# Patient Record
Sex: Female | Born: 1935 | Race: Black or African American | Hispanic: No | Smoking: Current every day smoker
Health system: Southern US, Community
[De-identification: ages and names within clinical notes are randomized; demographics above are authoritative.]

## PROBLEM LIST (undated history)

## (undated) DIAGNOSIS — M21372 Foot drop, left foot: Secondary | ICD-10-CM

## (undated) DIAGNOSIS — D649 Anemia, unspecified: Secondary | ICD-10-CM

## (undated) DIAGNOSIS — M899 Disorder of bone, unspecified: Secondary | ICD-10-CM

## (undated) DIAGNOSIS — M109 Gout, unspecified: Secondary | ICD-10-CM

## (undated) DIAGNOSIS — I10 Essential (primary) hypertension: Secondary | ICD-10-CM

## (undated) DIAGNOSIS — G8929 Other chronic pain: Secondary | ICD-10-CM

## (undated) DIAGNOSIS — J309 Allergic rhinitis, unspecified: Secondary | ICD-10-CM

## (undated) DIAGNOSIS — F29 Unspecified psychosis not due to a substance or known physiological condition: Secondary | ICD-10-CM

## (undated) DIAGNOSIS — E785 Hyperlipidemia, unspecified: Secondary | ICD-10-CM

## (undated) DIAGNOSIS — M21371 Foot drop, right foot: Secondary | ICD-10-CM

## (undated) DIAGNOSIS — M545 Low back pain: Secondary | ICD-10-CM

## (undated) DIAGNOSIS — M1990 Unspecified osteoarthritis, unspecified site: Secondary | ICD-10-CM

## (undated) DIAGNOSIS — M949 Disorder of cartilage, unspecified: Secondary | ICD-10-CM

## (undated) HISTORY — DX: Hyperlipidemia, unspecified: E78.5

## (undated) HISTORY — DX: Low back pain: M54.5

## (undated) HISTORY — DX: Essential (primary) hypertension: I10

## (undated) HISTORY — DX: Disorder of bone, unspecified: M89.9

## (undated) HISTORY — DX: Other chronic pain: G89.29

## (undated) HISTORY — DX: Allergic rhinitis, unspecified: J30.9

## (undated) HISTORY — DX: Foot drop, right foot: M21.371

## (undated) HISTORY — DX: Gout, unspecified: M10.9

## (undated) HISTORY — DX: Unspecified psychosis not due to a substance or known physiological condition: F29

## (undated) HISTORY — DX: Foot drop, left foot: M21.372

## (undated) HISTORY — DX: Anemia, unspecified: D64.9

## (undated) HISTORY — DX: Disorder of cartilage, unspecified: M94.9

---

## 1965-07-05 HISTORY — PX: OTHER SURGICAL HISTORY: SHX169

## 1998-06-03 ENCOUNTER — Encounter: Payer: Self-pay | Admitting: Family Medicine

## 1998-06-03 ENCOUNTER — Ambulatory Visit (HOSPITAL_COMMUNITY): Admission: RE | Admit: 1998-06-03 | Discharge: 1998-06-03 | Payer: Self-pay | Admitting: Internal Medicine

## 2001-09-05 ENCOUNTER — Encounter: Payer: Self-pay | Admitting: Emergency Medicine

## 2001-09-05 ENCOUNTER — Emergency Department (HOSPITAL_COMMUNITY): Admission: EM | Admit: 2001-09-05 | Discharge: 2001-09-05 | Payer: Self-pay | Admitting: Emergency Medicine

## 2001-10-05 ENCOUNTER — Encounter: Admission: RE | Admit: 2001-10-05 | Discharge: 2001-10-05 | Payer: Self-pay | Admitting: Internal Medicine

## 2001-10-16 ENCOUNTER — Encounter: Admission: RE | Admit: 2001-10-16 | Discharge: 2001-10-16 | Payer: Self-pay | Admitting: Internal Medicine

## 2001-10-17 ENCOUNTER — Encounter: Admission: RE | Admit: 2001-10-17 | Discharge: 2001-10-17 | Payer: Self-pay

## 2001-10-24 ENCOUNTER — Encounter: Admission: RE | Admit: 2001-10-24 | Discharge: 2001-10-24 | Payer: Self-pay | Admitting: Internal Medicine

## 2003-01-09 ENCOUNTER — Encounter: Admission: RE | Admit: 2003-01-09 | Discharge: 2003-01-09 | Payer: Self-pay | Admitting: Internal Medicine

## 2003-01-18 ENCOUNTER — Encounter: Payer: Self-pay | Admitting: Internal Medicine

## 2003-01-18 ENCOUNTER — Ambulatory Visit (HOSPITAL_COMMUNITY): Admission: RE | Admit: 2003-01-18 | Discharge: 2003-01-18 | Payer: Self-pay | Admitting: Internal Medicine

## 2003-04-04 ENCOUNTER — Encounter: Admission: RE | Admit: 2003-04-04 | Discharge: 2003-04-04 | Payer: Self-pay | Admitting: Internal Medicine

## 2003-08-07 ENCOUNTER — Encounter: Admission: RE | Admit: 2003-08-07 | Discharge: 2003-08-07 | Payer: Self-pay | Admitting: Internal Medicine

## 2003-08-23 ENCOUNTER — Encounter: Admission: RE | Admit: 2003-08-23 | Discharge: 2003-08-23 | Payer: Self-pay | Admitting: Internal Medicine

## 2003-10-20 ENCOUNTER — Emergency Department (HOSPITAL_COMMUNITY): Admission: EM | Admit: 2003-10-20 | Discharge: 2003-10-20 | Payer: Self-pay | Admitting: Emergency Medicine

## 2003-11-18 ENCOUNTER — Encounter: Admission: RE | Admit: 2003-11-18 | Discharge: 2003-11-18 | Payer: Self-pay | Admitting: Internal Medicine

## 2003-12-26 ENCOUNTER — Emergency Department (HOSPITAL_COMMUNITY): Admission: EM | Admit: 2003-12-26 | Discharge: 2003-12-26 | Payer: Self-pay | Admitting: Emergency Medicine

## 2004-09-08 ENCOUNTER — Ambulatory Visit: Payer: Self-pay | Admitting: Internal Medicine

## 2004-09-18 ENCOUNTER — Ambulatory Visit: Payer: Self-pay | Admitting: Internal Medicine

## 2004-09-21 ENCOUNTER — Ambulatory Visit: Payer: Self-pay | Admitting: Internal Medicine

## 2004-09-23 ENCOUNTER — Ambulatory Visit: Payer: Self-pay

## 2004-10-12 ENCOUNTER — Ambulatory Visit: Payer: Self-pay | Admitting: Gastroenterology

## 2004-10-26 ENCOUNTER — Ambulatory Visit: Payer: Self-pay | Admitting: Gastroenterology

## 2004-10-26 ENCOUNTER — Ambulatory Visit (HOSPITAL_COMMUNITY): Admission: RE | Admit: 2004-10-26 | Discharge: 2004-10-26 | Payer: Self-pay | Admitting: Gastroenterology

## 2004-11-11 ENCOUNTER — Ambulatory Visit: Payer: Self-pay | Admitting: Internal Medicine

## 2004-11-23 ENCOUNTER — Ambulatory Visit: Payer: Self-pay | Admitting: Internal Medicine

## 2005-02-17 ENCOUNTER — Ambulatory Visit: Payer: Self-pay | Admitting: Internal Medicine

## 2005-08-31 ENCOUNTER — Ambulatory Visit: Payer: Self-pay | Admitting: Internal Medicine

## 2005-10-07 ENCOUNTER — Ambulatory Visit: Payer: Self-pay | Admitting: Internal Medicine

## 2006-05-10 ENCOUNTER — Ambulatory Visit: Payer: Self-pay | Admitting: Internal Medicine

## 2006-09-28 ENCOUNTER — Ambulatory Visit: Payer: Self-pay | Admitting: Internal Medicine

## 2006-09-28 LAB — CONVERTED CEMR LAB
ALT: 20 units/L (ref 0–40)
AST: 23 units/L (ref 0–37)
Albumin: 3.6 g/dL (ref 3.5–5.2)
Alkaline Phosphatase: 97 units/L (ref 39–117)
BUN: 14 mg/dL (ref 6–23)
Basophils Absolute: 0 10*3/uL (ref 0.0–0.1)
Basophils Relative: 0.1 % (ref 0.0–1.0)
Bilirubin Urine: NEGATIVE
Bilirubin, Direct: 0.1 mg/dL (ref 0.0–0.3)
CO2: 31 meq/L (ref 19–32)
Calcium: 9.7 mg/dL (ref 8.4–10.5)
Chloride: 102 meq/L (ref 96–112)
Cholesterol: 248 mg/dL (ref 0–200)
Creatinine, Ser: 1 mg/dL (ref 0.4–1.2)
Crystals: NEGATIVE
Direct LDL: 183.6 mg/dL
Eosinophils Absolute: 0.1 10*3/uL (ref 0.0–0.6)
Eosinophils Relative: 1.8 % (ref 0.0–5.0)
GFR calc Af Amer: 69 mL/min
GFR calc non Af Amer: 57 mL/min
Glucose, Bld: 100 mg/dL — ABNORMAL HIGH (ref 70–99)
HCT: 36.3 % (ref 36.0–46.0)
HDL: 48.1 mg/dL (ref >39.0)
Hemoglobin: 12.4 g/dL (ref 12.0–15.0)
Ketones, ur: NEGATIVE mg/dL
Lymphocytes Relative: 26.4 % (ref 12.0–46.0)
MCHC: 34.1 g/dL (ref 30.0–36.0)
MCV: 90.5 fL (ref 78.0–100.0)
Monocytes Absolute: 0.6 10*3/uL (ref 0.2–0.7)
Monocytes Relative: 7.9 % (ref 3.0–11.0)
Mucus, UA: NEGATIVE
Neutro Abs: 4.6 10*3/uL (ref 1.4–7.7)
Neutrophils Relative %: 63.8 % (ref 43.0–77.0)
Nitrite: NEGATIVE
Platelets: 313 10*3/uL (ref 150–400)
Potassium: 4.2 meq/L (ref 3.5–5.1)
RBC: 4.01 M/uL (ref 3.87–5.11)
RDW: 14 % (ref 11.5–14.6)
Sodium: 142 meq/L (ref 135–145)
Specific Gravity, Urine: 1.02 (ref 1.000–1.03)
TSH: 2.65 microintl units/mL (ref 0.35–5.50)
Total Bilirubin: 0.7 mg/dL (ref 0.3–1.2)
Total CHOL/HDL Ratio: 5.2
Total Protein, Urine: NEGATIVE mg/dL
Total Protein: 7.4 g/dL (ref 6.0–8.3)
Triglycerides: 157 mg/dL — ABNORMAL HIGH (ref 0–149)
Urine Glucose: NEGATIVE mg/dL
Urobilinogen, UA: 0.2 (ref 0.0–1.0)
VLDL: 31 mg/dL (ref 0–40)
WBC: 7.2 10*3/uL (ref 4.5–10.5)
pH: 6 (ref 5.0–8.0)

## 2006-11-04 ENCOUNTER — Ambulatory Visit: Payer: Self-pay | Admitting: Internal Medicine

## 2006-11-04 LAB — CONVERTED CEMR LAB
Bacteria, UA: NEGATIVE
Bilirubin Urine: NEGATIVE
Crystals: NEGATIVE
Ketones, ur: NEGATIVE mg/dL
Nitrite: NEGATIVE
Specific Gravity, Urine: 1.01 (ref 1.000–1.03)
Total Protein, Urine: NEGATIVE mg/dL
Urine Glucose: NEGATIVE mg/dL
Urobilinogen, UA: 0.2 (ref 0.0–1.0)
pH: 6 (ref 5.0–8.0)

## 2006-12-22 ENCOUNTER — Ambulatory Visit: Payer: Self-pay | Admitting: Internal Medicine

## 2007-02-22 ENCOUNTER — Encounter: Payer: Self-pay | Admitting: Internal Medicine

## 2007-02-22 DIAGNOSIS — E785 Hyperlipidemia, unspecified: Secondary | ICD-10-CM | POA: Insufficient documentation

## 2007-02-22 DIAGNOSIS — I10 Essential (primary) hypertension: Secondary | ICD-10-CM

## 2007-02-22 HISTORY — DX: Hyperlipidemia, unspecified: E78.5

## 2007-02-22 HISTORY — DX: Essential (primary) hypertension: I10

## 2007-04-09 ENCOUNTER — Emergency Department (HOSPITAL_COMMUNITY): Admission: EM | Admit: 2007-04-09 | Discharge: 2007-04-09 | Payer: Self-pay | Admitting: Emergency Medicine

## 2007-05-05 ENCOUNTER — Other Ambulatory Visit: Admission: RE | Admit: 2007-05-05 | Discharge: 2007-05-05 | Payer: Self-pay | Admitting: Otolaryngology

## 2007-07-28 ENCOUNTER — Encounter: Payer: Self-pay | Admitting: Internal Medicine

## 2007-11-10 ENCOUNTER — Encounter: Payer: Self-pay | Admitting: Internal Medicine

## 2008-02-16 ENCOUNTER — Telehealth: Payer: Self-pay | Admitting: Internal Medicine

## 2008-03-26 ENCOUNTER — Ambulatory Visit: Payer: Self-pay | Admitting: Internal Medicine

## 2008-03-26 DIAGNOSIS — R350 Frequency of micturition: Secondary | ICD-10-CM | POA: Insufficient documentation

## 2008-03-27 ENCOUNTER — Encounter: Payer: Self-pay | Admitting: Internal Medicine

## 2008-03-27 DIAGNOSIS — M899 Disorder of bone, unspecified: Secondary | ICD-10-CM | POA: Insufficient documentation

## 2008-03-27 DIAGNOSIS — J309 Allergic rhinitis, unspecified: Secondary | ICD-10-CM | POA: Insufficient documentation

## 2008-03-27 DIAGNOSIS — M949 Disorder of cartilage, unspecified: Secondary | ICD-10-CM

## 2008-03-27 DIAGNOSIS — K5730 Diverticulosis of large intestine without perforation or abscess without bleeding: Secondary | ICD-10-CM | POA: Insufficient documentation

## 2008-03-27 HISTORY — DX: Disorder of bone, unspecified: M89.9

## 2008-03-27 HISTORY — DX: Allergic rhinitis, unspecified: J30.9

## 2008-03-27 LAB — CONVERTED CEMR LAB
ALT: 17 units/L (ref 0–35)
AST: 22 units/L (ref 0–37)
Albumin: 3.7 g/dL (ref 3.5–5.2)
Alkaline Phosphatase: 84 units/L (ref 39–117)
BUN: 20 mg/dL (ref 6–23)
Basophils Absolute: 0.1 10*3/uL (ref 0.0–0.1)
Basophils Relative: 1 % (ref 0.0–3.0)
Bilirubin Urine: NEGATIVE
Bilirubin, Direct: 0.2 mg/dL (ref 0.0–0.3)
CO2: 30 meq/L (ref 19–32)
Calcium: 9.6 mg/dL (ref 8.4–10.5)
Chloride: 105 meq/L (ref 96–112)
Cholesterol: 264 mg/dL (ref 0–200)
Creatinine, Ser: 1.1 mg/dL (ref 0.4–1.2)
Crystals: NEGATIVE
Direct LDL: 191 mg/dL
Eosinophils Absolute: 0.1 10*3/uL (ref 0.0–0.7)
Eosinophils Relative: 1.5 % (ref 0.0–5.0)
GFR calc Af Amer: 62 mL/min
GFR calc non Af Amer: 51 mL/min
Glucose, Bld: 93 mg/dL (ref 70–99)
HCT: 35.9 % — ABNORMAL LOW (ref 36.0–46.0)
HDL: 49.1 mg/dL (ref >39.0)
Hemoglobin: 12.2 g/dL (ref 12.0–15.0)
Ketones, ur: NEGATIVE mg/dL
Lymphocytes Relative: 24.9 % (ref 12.0–46.0)
MCHC: 33.9 g/dL (ref 30.0–36.0)
MCV: 92.9 fL (ref 78.0–100.0)
Monocytes Absolute: 0.5 10*3/uL (ref 0.1–1.0)
Monocytes Relative: 7.9 % (ref 3.0–12.0)
Mucus, UA: NEGATIVE
Neutro Abs: 4.3 10*3/uL (ref 1.4–7.7)
Neutrophils Relative %: 64.7 % (ref 43.0–77.0)
Nitrite: POSITIVE — AB
Platelets: 293 10*3/uL (ref 150–400)
Potassium: 4.1 meq/L (ref 3.5–5.1)
RBC: 3.86 M/uL — ABNORMAL LOW (ref 3.87–5.11)
RDW: 14 % (ref 11.5–14.6)
Sodium: 143 meq/L (ref 135–145)
Specific Gravity, Urine: 1.025 (ref 1.000–1.03)
TSH: 1.85 microintl units/mL (ref 0.35–5.50)
Total Bilirubin: 0.6 mg/dL (ref 0.3–1.2)
Total CHOL/HDL Ratio: 5.4
Total Protein, Urine: 30 mg/dL — AB
Total Protein: 7.5 g/dL (ref 6.0–8.3)
Triglycerides: 161 mg/dL — ABNORMAL HIGH (ref 0–149)
Urine Glucose: NEGATIVE mg/dL
Urobilinogen, UA: 0.2 (ref 0.0–1.0)
VLDL: 32 mg/dL (ref 0–40)
WBC: 6.7 10*3/uL (ref 4.5–10.5)
pH: 5.5 (ref 5.0–8.0)

## 2008-03-28 LAB — CONVERTED CEMR LAB: Vit D, 1,25-Dihydroxy: 20 — ABNORMAL LOW (ref 30–89)

## 2009-04-21 ENCOUNTER — Telehealth: Payer: Self-pay | Admitting: Internal Medicine

## 2009-05-20 ENCOUNTER — Inpatient Hospital Stay (HOSPITAL_COMMUNITY): Admission: EM | Admit: 2009-05-20 | Discharge: 2009-05-23 | Payer: Self-pay | Admitting: Emergency Medicine

## 2009-05-20 ENCOUNTER — Ambulatory Visit: Payer: Self-pay | Admitting: Internal Medicine

## 2009-05-21 ENCOUNTER — Encounter: Payer: Self-pay | Admitting: Internal Medicine

## 2009-05-22 ENCOUNTER — Encounter (INDEPENDENT_AMBULATORY_CARE_PROVIDER_SITE_OTHER): Payer: Self-pay | Admitting: Internal Medicine

## 2009-05-26 ENCOUNTER — Telehealth: Payer: Self-pay | Admitting: Internal Medicine

## 2009-06-03 ENCOUNTER — Emergency Department (HOSPITAL_COMMUNITY): Admission: EM | Admit: 2009-06-03 | Discharge: 2009-06-03 | Payer: Self-pay | Admitting: Emergency Medicine

## 2009-06-05 ENCOUNTER — Encounter: Payer: Self-pay | Admitting: Internal Medicine

## 2009-06-06 ENCOUNTER — Ambulatory Visit: Payer: Self-pay | Admitting: Internal Medicine

## 2009-06-25 ENCOUNTER — Encounter: Payer: Self-pay | Admitting: Internal Medicine

## 2009-06-25 ENCOUNTER — Ambulatory Visit: Payer: Self-pay | Admitting: Internal Medicine

## 2009-06-25 ENCOUNTER — Telehealth: Payer: Self-pay | Admitting: Internal Medicine

## 2009-06-25 DIAGNOSIS — M109 Gout, unspecified: Secondary | ICD-10-CM | POA: Insufficient documentation

## 2009-06-25 DIAGNOSIS — M25579 Pain in unspecified ankle and joints of unspecified foot: Secondary | ICD-10-CM | POA: Insufficient documentation

## 2009-06-25 DIAGNOSIS — D649 Anemia, unspecified: Secondary | ICD-10-CM

## 2009-06-25 HISTORY — DX: Anemia, unspecified: D64.9

## 2009-06-26 ENCOUNTER — Telehealth: Payer: Self-pay | Admitting: Internal Medicine

## 2009-06-26 ENCOUNTER — Encounter: Payer: Self-pay | Admitting: Internal Medicine

## 2009-06-30 ENCOUNTER — Encounter: Payer: Self-pay | Admitting: Internal Medicine

## 2009-07-03 ENCOUNTER — Encounter: Payer: Self-pay | Admitting: Internal Medicine

## 2009-07-07 ENCOUNTER — Telehealth: Payer: Self-pay | Admitting: Internal Medicine

## 2009-07-10 ENCOUNTER — Telehealth: Payer: Self-pay | Admitting: Internal Medicine

## 2009-07-13 ENCOUNTER — Emergency Department (HOSPITAL_COMMUNITY): Admission: EM | Admit: 2009-07-13 | Discharge: 2009-07-13 | Payer: Self-pay | Admitting: Emergency Medicine

## 2009-07-21 ENCOUNTER — Telehealth: Payer: Self-pay | Admitting: Internal Medicine

## 2009-07-24 ENCOUNTER — Encounter: Payer: Self-pay | Admitting: Internal Medicine

## 2009-07-25 ENCOUNTER — Telehealth (INDEPENDENT_AMBULATORY_CARE_PROVIDER_SITE_OTHER): Payer: Self-pay | Admitting: *Deleted

## 2009-07-29 ENCOUNTER — Ambulatory Visit: Payer: Self-pay | Admitting: Internal Medicine

## 2009-09-17 ENCOUNTER — Encounter: Admission: RE | Admit: 2009-09-17 | Discharge: 2009-09-17 | Payer: Self-pay | Admitting: Orthopaedic Surgery

## 2009-09-23 ENCOUNTER — Ambulatory Visit: Payer: Self-pay | Admitting: Internal Medicine

## 2009-09-23 DIAGNOSIS — M109 Gout, unspecified: Secondary | ICD-10-CM | POA: Insufficient documentation

## 2009-09-23 HISTORY — DX: Gout, unspecified: M10.9

## 2009-09-24 LAB — CONVERTED CEMR LAB
ALT: 15 units/L (ref 0–35)
AST: 17 units/L (ref 0–37)
Albumin: 3.6 g/dL (ref 3.5–5.2)
Alkaline Phosphatase: 70 units/L (ref 39–117)
BUN: 12 mg/dL (ref 6–23)
Basophils Absolute: 0.1 10*3/uL (ref 0.0–0.1)
Basophils Relative: 0.9 % (ref 0.0–3.0)
Bilirubin Urine: NEGATIVE
Bilirubin, Direct: 0.1 mg/dL (ref 0.0–0.3)
CO2: 31 meq/L (ref 19–32)
Calcium: 9.4 mg/dL (ref 8.4–10.5)
Chloride: 106 meq/L (ref 96–112)
Cholesterol: 163 mg/dL (ref 0–200)
Creatinine, Ser: 0.9 mg/dL (ref 0.4–1.2)
Eosinophils Absolute: 0.1 10*3/uL (ref 0.0–0.7)
Eosinophils Relative: 1.1 % (ref 0.0–5.0)
Folate: 9.3 ng/mL
GFR calc non Af Amer: 77.43 mL/min (ref >60)
Glucose, Bld: 78 mg/dL (ref 70–99)
HCT: 34.6 % — ABNORMAL LOW (ref 36.0–46.0)
HDL: 56.5 mg/dL (ref >39.00)
Hemoglobin: 11.5 g/dL — ABNORMAL LOW (ref 12.0–15.0)
Iron: 42 ug/dL (ref 42–145)
LDL Cholesterol: 80 mg/dL (ref 0–99)
Lymphocytes Relative: 29 % (ref 12.0–46.0)
Lymphs Abs: 1.8 10*3/uL (ref 0.7–4.0)
MCHC: 33.2 g/dL (ref 30.0–36.0)
MCV: 94 fL (ref 78.0–100.0)
Monocytes Absolute: 0.5 10*3/uL (ref 0.1–1.0)
Monocytes Relative: 9 % (ref 3.0–12.0)
Neutro Abs: 3.6 10*3/uL (ref 1.4–7.7)
Neutrophils Relative %: 60 % (ref 43.0–77.0)
Nitrite: NEGATIVE
Platelets: 258 10*3/uL (ref 150.0–400.0)
Potassium: 3.9 meq/L (ref 3.5–5.1)
RBC: 3.68 M/uL — ABNORMAL LOW (ref 3.87–5.11)
RDW: 16.5 % — ABNORMAL HIGH (ref 11.5–14.6)
Saturation Ratios: 22 % (ref 20.0–50.0)
Sodium: 142 meq/L (ref 135–145)
Specific Gravity, Urine: 1.02 (ref 1.000–1.030)
TSH: 3.61 microintl units/mL (ref 0.35–5.50)
Total Bilirubin: 0.4 mg/dL (ref 0.3–1.2)
Total CHOL/HDL Ratio: 3
Total Protein, Urine: NEGATIVE mg/dL
Total Protein: 6.6 g/dL (ref 6.0–8.3)
Transferrin: 136.6 mg/dL — ABNORMAL LOW (ref 212.0–360.0)
Triglycerides: 132 mg/dL (ref 0.0–149.0)
Uric Acid, Serum: 6.9 mg/dL (ref 2.4–7.0)
Urine Glucose: NEGATIVE mg/dL
Urobilinogen, UA: 0.2 (ref 0.0–1.0)
VLDL: 26.4 mg/dL (ref 0.0–40.0)
Vit D, 25-Hydroxy: 53 ng/mL (ref 30–89)
Vitamin B-12: 222 pg/mL (ref 211–911)
WBC: 6.1 10*3/uL (ref 4.5–10.5)
pH: 5.5 (ref 5.0–8.0)

## 2009-10-20 ENCOUNTER — Encounter: Payer: Self-pay | Admitting: Internal Medicine

## 2009-11-26 ENCOUNTER — Encounter: Payer: Self-pay | Admitting: Internal Medicine

## 2010-04-21 ENCOUNTER — Encounter: Payer: Self-pay | Admitting: Internal Medicine

## 2010-08-04 NOTE — Letter (Signed)
Summary: CMN for [facility]CS  CMN for [facility]   Imported By: Sherian Rein 06/10/2009 09:13:43  _____________________________________________________________________  External Attachment:    Type:   Image     Comment:   External Document

## 2010-08-04 NOTE — Letter (Signed)
Summary: CMN/HCS  CMN/HCS   Imported By: Lester Higganum 12/02/2009 08:47:25  _____________________________________________________________________  External Attachment:    Type:   Image     Comment:   External Document

## 2010-08-04 NOTE — Procedures (Signed)
Summary: Upper Endoscopy  Patient: Sydney Lowery Note: All result statuses are Final unless otherwise noted.  Tests: (1) Upper Endoscopy (EGD)   EGD Upper Endoscopy       DONE     [facility]     [address] [place], [place]  [address]           ENDOSCOPY PROCEDURE REPORT           PATIENT:  Sydney, Lowery  MR#:  [id number]     BIRTHDATE:  06/27/1927, 81 yrs. old  GENDER:  female           ENDOSCOPIST:  Iva Boop, MD, [facility]     Referred by:           Hospitalist           PROCEDURE DATE:  05/21/2009     PROCEDURE:  EGD with biopsy for H. pylori     ASA CLASS:  Class III     INDICATIONS:  hemorrhage of GI tract           MEDICATIONS:   There was residual sedation effect present from     prior procedure., Versed 1 mg IV     TOPICAL ANESTHETIC:  Cetacaine Spray           DESCRIPTION OF PROCEDURE:   After the risks benefits and     alternatives of the procedure were thoroughly explained, informed     consent was obtained.  The [id number] ([id number]) endoscope was     introduced through the mouth and advanced to the second portion of     the duodenum, without limitations.  The instrument was slowly     withdrawn as the mucosa was fully examined.     <<PROCEDUREIMAGES>>           Multiple erosions were found in the antrum. All clean-based and     not greater than 3 mm. A biopsy for H. pylori was taken.  A hiatal     hernia was found. Sliding hiatal hernia 3-4 cm.  Otherwise the     examination was normal.    Retroflexed views revealed a hiatal     hernia.    The scope was then withdrawn from the patient and the     procedure completed.           COMPLICATIONS:  None           ENDOSCOPIC IMPRESSION:     1) Erosions, multiple in the antrum, likely from Goody's     2) Hiatal hernia     3) Otherwise normal examination           RECOMMENDATIONS:     Stop Goody's and find other treatment for arthritis if possible.           Treat H. pylori if +     Daily PPI  reasonable given likelihood she will use NSAID's           Colonoscopy was incomplete and this exam does not reveal source     of GI hemorrhage so will obtain ACBE to complete evaluation.           Iva Boop, MD, [facility]           n.     eSIGNED:   Iva Boop at 05/21/2009 12:18 PM           Consuella Lose, [id number]  Note: An exclamation  mark (!) indicates a result that was not dispersed into the flowsheet. Document Creation Date: 05/21/2009 4:06 PM _______________________________________________________________________  (1) Order result status: Final Collection or observation date-time: 05/21/2009 12:11 Requested date-time:  Receipt date-time:  Reported date-time:  Referring Physician:   Ordering Physician: Stan Head [phone]) Specimen Source:  Source: Launa Grill Order Number: [id number] Lab site:   Appended Document: Upper Endoscopy L-Clotest (H. pylori), Biopsy - STATUS: Final                                            Perform Date: 17Nov10 12:00  Ordered By: Lorelle Gibbs Date: 17Nov10 12:25                                       Last Updated Date: 98JXB14 12:17  Facility: [facility]                              Department: MICR  Accession #: [id number]                  USN:       [id number]  Findings  Result Name                              Result     Abnl   Normal Range     Units      Perf. Loc.  Helicobacter Screen                      SEE NOTE.         URENEG    UREASE NEGATIVE    CLOTEST DETECTS THE UREASE    ENZYME OF HELICOBACTER    PYLORI IN GASTRIC MUCOSAL    BIOPSIES.  Additional Information  HL7 RESULT STATUS : F  External IF Update Timestamp : 2009-05-22:12:14:00.000000

## 2010-08-04 NOTE — Consult Note (Signed)
Summary: [facility]   Imported By: Lester Ranger 08/06/2009 08:48:35  _____________________________________________________________________  External Attachment:    Type:   Image     Comment:   External Document

## 2010-08-04 NOTE — Assessment & Plan Note (Signed)
Summary: post hosp f/u from [facility]/#/cd   Vital Signs:  Patient profile:   77 year old female Height:      65 inches Weight:      187 pounds BMI:     31.23 O2 Sat:      98 % on Room air Temp:     98.7 degrees F oral Pulse rate:   71 / minute BP sitting:   150 / 102  (left arm) Cuff size:   large  Vitals Entered ByZella Ball Ewing (June 25, 2009 3:13 PM)  O2 Flow:  Room air CC: hospital followup/RE   CC:  hospital followup/RE.  History of Present Illness: here with pain, swelling to left ankle and foot quite severe, seemed to occur since she was hospd (only occurred post d/c);  seen and treated for ? gout at the ER recent and seemed some improved with the prednisone as far as the intense pain and heat, but unfortunately significant pain and marked swelling persist;  she has no obious inciting event of trauma she recalls and no hx of charcot joint in the past;  denies fever, new injury or trauma,  needs med refills as she has been out of BP for 3 days;  did not seem better with the detrol so has not been taking for over 6 mo and does not want to consider other tx at this time although she still has urinary freq and mild wetting accidents /wears depends;  duaghter with her today remarks she essentially has severe problem with walking, has to be essentially carried and is using the office wheelchair while she is here;  has seen murphy wainer in the past  for other ortho problems  Problems Prior to Update: 1)  Pain in Joint, Ankle and Foot  (ICD-719.47) 2)  Anemia-nos  (ICD-285.9) 3)  Acute Gouty Arthropathy  (ICD-274.01) 4)  Allergic Rhinitis  (ICD-477.9) 5)  Diverticulosis, Colon  (ICD-562.10) 6)  Osteopenia  (ICD-733.90) 7)  Frequency, Urinary  (ICD-788.41) 8)  Preventive Health Care  (ICD-V70.0) 9)  Hypertension  (ICD-401.9) 10)  Hyperlipidemia  (ICD-272.4)  Medications Prior to Update: 1)  Lisinopril 40 Mg Tabs (Lisinopril) .... Take 1 Tablet By Mouth Once A Day 2)   Triamterene-Hctz 37.5-25 Mg Caps (Triamterene-Hctz) .... Take 1 Capsule By Mouth Once A Day 3)  Amlodipine Besylate 5 Mg Tabs (Amlodipine Besylate) .Marland Kitchen.. 1 By Mouth Once Daily 4)  Simvastatin 40 Mg Tabs (Simvastatin) .Marland Kitchen.. 1po Once Daily 5)  Detrol La 4 Mg Xr24h-Cap (Tolterodine Tartrate) .Marland Kitchen.. 1 By Mouth Once Daily 6)  Adult Aspirin Ec Low Strength 81 Mg Tbec (Aspirin) .Marland Kitchen.. 1 By Mouth Once Daily 7)  Ciprofloxacin Hcl 500 Mg Tabs (Ciprofloxacin Hcl) .Marland Kitchen.. 1 By Mouth Two Times A Day  Current Medications (verified): 1)  Lisinopril 40 Mg Tabs (Lisinopril) .... Take 1 Tablet By Mouth Once A Day 2)  Triamterene-Hctz 37.5-25 Mg Caps (Triamterene-Hctz) .... Take 1 Capsule By Mouth Once A Day 3)  Amlodipine Besylate 5 Mg Tabs (Amlodipine Besylate) .Marland Kitchen.. 1 By Mouth Once Daily 4)  Simvastatin 40 Mg Tabs (Simvastatin) .Marland Kitchen.. 1po Once Daily 5)  Alprazolam 0.25 Mg Tabs (Alprazolam) .Marland Kitchen.. 1 By Mouth Three Times A Day As Needed 6)  Oxycodone Hcl 5 Mg Tabs (Oxycodone Hcl) .Marland Kitchen.. 1 By Mouth Q 6 Hrs As Needed 7)  Prednisone 10 Mg Tabs (Prednisone) .... 4po Qd For 3days, Then 3po Qd For 3days, Then 2po Qd For 3days, Then 1po Qd For 3 Days, Then  Stop  Allergies (verified): 1)  ! Penicillin 2)  ! 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   Head:  normocephalic and atraumatic.   Eyes:  vision grossly intact, pupils equal, and pupils round.   Ears:  R  ear normal and L ear normal.   Nose:  no external deformity and no nasal discharge.   Mouth:  good dentition and pharynx pink and moist.   Neck:  supple and no masses.   Lungs:  normal respiratory effort and normal breath sounds.   Heart:  normal rate and regular rhythm.   Abdomen:  soft, non-tender, and normal bowel sounds.   Msk:  no joint tenderness and no joint swelling.  except 3+ swelling and mild tender (with midl warmth) to the left ankle and dorsal foot Extremities:  no edema, no erythema except for above   Impression & Recommendations:  Problem # 1:  PAIN IN JOINT, ANKLE AND FOOT (ICD-719.47) vs charcot joint (I would tend to favor the charcot joint);  will attempt to tx for now as gout with depo shot, prednisone, pain med, check film and consider ortho referral, declines uric acid level today Orders: Depo- Medrol 40mg  (J1030) Depo- Medrol 80mg  (J1040) Admin of Therapeutic Inj  intramuscular or subcutaneous (98119) T-Ankle Comp Left Min 3 Views (73610TC)  Problem # 2:  HYPERTENSION (ICD-401.9)  Her updated medication list for this problem includes:    Lisinopril 40 Mg Tabs (Lisinopril) .Marland Kitchen... Take 1 tablet by mouth once a day    Triamterene-hctz 37.5-25 Mg Caps (Triamterene-hctz) .Marland Kitchen... Take 1 capsule by mouth once a day    Amlodipine Besylate 5 Mg Tabs (Amlodipine besylate) .Marland Kitchen... 1 by mouth once daily  uncontrolled - to re-start BP meds and all meds refilled today  Problem # 3:  ANEMIA-NOS (ICD-285.9) after recent GI bleed, denies ongoing futher blood loss, abd pain or orthostasis - ok to follow for now, again declines further blood work today; will try to re-address next visit  Complete Medication List: 1)  Lisinopril 40 Mg Tabs (Lisinopril) .... Take 1 tablet by mouth once a day 2)  Triamterene-hctz 37.5-25 Mg Caps (Triamterene-hctz) .... Take 1 capsule by mouth once a day 3)  Amlodipine Besylate 5 Mg Tabs (Amlodipine besylate) .Marland Kitchen.. 1 by mouth once daily 4)   Simvastatin 40 Mg Tabs (Simvastatin) .Marland Kitchen.. 1po once daily 5)  Alprazolam 0.25 Mg Tabs (Alprazolam) .Marland Kitchen.. 1 by mouth three times a day as needed 6)  Oxycodone Hcl 5 Mg Tabs (Oxycodone hcl) .Marland Kitchen.. 1 by mouth q 6 hrs as needed 7)  Prednisone 10 Mg Tabs (Prednisone) .... 4po qd for 3days, then 3po qd for 3days, then 2po qd for 3days, then 1po qd for 3 days, then stop  Patient Instructions: 1)  you had the steroid shot today 2)  Please take all new medications as prescribed  - the pain medicine and the prednisone 3)  call on Mon Dec 27 for orthopedic referral if no better  (dr Renae Fickle) 4)  call [facility] for the toenails 5)  Please schedule a follow-up appointment in 1 month to check Blood Pressure, arthritis, and check on the anemia Prescriptions: PREDNISONE 10 MG TABS (PREDNISONE) 4po qd for 3days, then 3po qd for 3days, then 2po qd for 3days, then 1po qd for 3 days, then stop  #30 x 0   Entered and Authorized by:   Corwin Levins MD   Signed by:   Corwin Levins MD on 06/25/2009   Method used:   Faxed to ...       [facility] (retail)       [address]       [place], [place]  [address]       Ph: [phone]       Fax: [phone]   RxID:   [id number] OXYCODONE HCL 5 MG TABS (OXYCODONE HCL) 1 by mouth q 6 hrs as needed  #120 x 0   Entered and Authorized by:   Corwin Levins MD   Signed by:   Corwin Levins MD on 06/25/2009   Method used:   Print then Give to Patient   RxID:   [id number] ALPRAZOLAM 0.25 MG TABS (ALPRAZOLAM) 1 by mouth three times a day as needed  #90 x 1   Entered and Authorized by:   Corwin Levins MD   Signed by:   Corwin Levins MD on 06/25/2009   Method used:   Print then Give to Patient   RxID:   [id number] SIMVASTATIN 40 MG TABS (SIMVASTATIN) 1po once daily  #90 x 3   Entered and Authorized by:   Corwin Levins MD   Signed by:   Corwin Levins MD on 06/25/2009   Method used:   Faxed to ...       [facility] (retail)       [address]       [place], [place]  [address]       Ph: [phone]       Fax: [phone]   RxID:   [id number] AMLODIPINE BESYLATE 5 MG TABS (AMLODIPINE BESYLATE) 1 by mouth once daily  #90 x 3   Entered and Authorized by:   Corwin Levins MD   Signed by:   Corwin Levins MD on 06/25/2009   Method used:   Faxed to ...       [facility] (retail)       [address]       [place], [place]  [address]       Ph: [phone]       Fax: [phone]   RxID:   [id number] LISINOPRIL 40 MG TABS (LISINOPRIL) Take 1 tablet by mouth once a day Brand medically necessary #90 x 3   Entered and Authorized by:   Corwin Levins MD   Signed by:   Corwin Levins MD on 06/25/2009   Method used:   Faxed to ...       [facility] (retail)       [address]       [place], [place]  [address]       Ph: [phone]       Fax: [phone]   RxID:  [id number] TRIAMTERENE-HCTZ 37.5-25 MG CAPS (TRIAMTERENE-HCTZ) Take 1 capsule by mouth once a day  #90 x 3   Entered and Authorized by:   Corwin Levins MD   Signed by:   Corwin Levins MD on 06/25/2009   Method used:   Faxed to ...       [facility] (retail)       [address]       [place], [place]  [address]       Ph: [phone]       Fax: [phone]   RxID:   [id number]    Medication Administration  Injection # 1:    Medication: Depo- Medrol 40mg     Diagnosis: ACUTE GOUTY ARTHROPATHY (ICD-274.01)    Route: IM    Site: LUOQ gluteus    Exp Date: 04/2010    Lot #: [id number]    Mfr: Teva    Given by: Zella Ball Ewing (June 25, 2009 4:07 PM)  Injection # 2:    Medication: Depo- Medrol 80mg     Diagnosis: ACUTE GOUTY ARTHROPATHY (ICD-274.01)    Route: IM    Site: LUOQ gluteus    Exp Date: 04/2010    Lot #: [id number]    Mfr: Teva    Given by: Zella Ball Ewing (June 25, 2009 4:07 PM)  Orders Added: 1)  Depo-  Medrol 40mg  [J1030] 2)  Depo- Medrol 80mg  [J1040] 3)  Admin of Therapeutic Inj  intramuscular or subcutaneous [96372] 4)  T-Ankle Comp Left Min 3 Views [73610TC] 5)  Est. Patient Level IV [56433]

## 2010-08-04 NOTE — Miscellaneous (Signed)
Summary: Orders Update  Clinical Lists Changes  Orders: Added new Referral order of Orthopedic Surgeon Referral (Ortho Surgeon) - Signed 

## 2010-08-04 NOTE — Procedures (Signed)
Summary: Colonoscopy  Patient: [facility] Note: All result statuses are Final unless otherwise noted.  Tests: (1) Colonoscopy (COL)   COL Colonoscopy           DONE     [facility]     [address] [place], [place]  [address]           COLONOSCOPY PROCEDURE REPORT           PATIENT:  Sydney, Lowery  MR#:  [id number]     BIRTHDATE:  06/27/1927, 81 yrs. old  GENDER:  female           ENDOSCOPIST:  Iva Boop, MD, [facility]           PROCEDURE DATE:  05/21/2009     PROCEDURE:  Incomplete colonoscopy     ASA CLASS:  Class III     INDICATIONS:  Gastrointestinal hemorrhage           MEDICATIONS:   Fentanyl 75 mcg IV versed, Versed 7 mcg IV           DESCRIPTION OF PROCEDURE:   After the risks benefits and     alternatives of the procedure were thoroughly explained, informed     consent was obtained.  Digital rectal exam was performed and     revealed no abnormalities.   The [id number] ([id number]) endoscope was     introduced through the anus and advanced to the sigmoid colon,     limited by a tortuous colon, stenosis.    The quality of the prep     was good, using Colyte.  The instrument was then slowly withdrawn     as the colon was fully examined.     <<PROCEDUREIMAGES>>           FINDINGS:  Severe diverticulosis was found in the sigmoid colon.     The sigmoid was also fixed and very difficult to navigate. I was     able to cross the angulated and fixed area but unable to advance     scope further (paradoxical motion) despite pressure and position     changes.  Normal rectum.   Retroflexed views in the rectum     revealed no abnormalities.    The scope was then withdrawn from     the patient and the procedure completed.           COMPLICATIONS:  None           ENDOSCOPIC IMPRESSION:     1) Severe diverticulosis in the sigmoid colon     2) Normal rectum     3) Incomplete examination of colon, see above     RECOMMENDATIONS:     EGD next and then likely to have  air-contrast barium enema     tomorrow           REPEAT EXAM:  No           Iva Boop, MD, [facility]           n.     eSIGNED:   Iva Boop at 05/21/2009 11:54 AM           Consuella Lose, [id number]  Note: An exclamation mark (!) indicates a result that was not dispersed into the flowsheet. Document Creation Date: 05/21/2009 4:06 PM _______________________________________________________________________  (1) Order result status: Final Collection or observation date-time: 05/21/2009 11:48 Requested date-time:  Receipt date-time:  Reported date-time:  Referring Physician:  Ordering Physician: Stan Head [phone]) Specimen Source:  Source: Launa Grill Order Number: [id number] Lab site:

## 2010-08-04 NOTE — Progress Notes (Signed)
Summary: verbal  Phone Note Other Incoming   Caller: Arin OT [phone] (c) [phone] (w) Summary of Call: OT called to inform MD that pt has not made much progress in the 4 weeks that she has been receiving services. OT is requesting verbal to come out two times a week for the next four weeks. Initial call taken by: [name] Pyle, CMA,  June 25, 2009 3:12 PM  Follow-up for Phone Call        ok for verbal Follow-up by: Corwin Levins MD,  June 25, 2009 3:47 PM  Additional Follow-up for Phone Call Additional follow up Details #1::        HHOT informed via cell phone Additional Follow-up by: [name] Pyle, CMA,  June 25, 2009 3:56 PM

## 2010-08-04 NOTE — Progress Notes (Signed)
Summary: Foot Pain  Phone Note Call from Patient Call back at Home Phone [phone]   Caller: Daughter [name] Summary of Call: pt's daughter called stating that pt was up all night with foot pain. pt is requesting a stronger pain medication Initial call taken by: [name] Pyle, CMA,  July 10, 2009 1:24 PM  Follow-up for Phone Call        ok to take 2 of the oxycodone every 6 hrs as needed, but that is the best I can do for now Follow-up by: Corwin Levins MD,  July 10, 2009 1:26 PM  Additional Follow-up for Phone Call Additional follow up Details #1::        pt daughter notified via personal VM Additional Follow-up by: [name] Pyle, CMA,  July 10, 2009 1:44 PM

## 2010-08-04 NOTE — Miscellaneous (Signed)
Summary: Medication Issue/[facility]  Medication Issue/[facility]   Imported By: Sherian Rein 06/11/2009 07:24:46  _____________________________________________________________________  External Attachment:    Type:   Image     Comment:   External Document

## 2010-08-04 NOTE — Miscellaneous (Signed)
Summary: Care Plan/[facility]  Care Plan/[facility]   Imported By: Sherian Rein 04/27/2010 13:25:02  _____________________________________________________________________  External Attachment:    Type:   Image     Comment:   External Document

## 2010-08-04 NOTE — Miscellaneous (Signed)
Summary: Discharge Summary/[facility]  Discharge Summary/[facility]   Imported By: Sherian Rein 08/20/2009 08:47:04  _____________________________________________________________________  External Attachment:    Type:   Image     Comment:   External Document

## 2010-08-04 NOTE — Assessment & Plan Note (Signed)
Summary: 1 MO ROV /NWS #  WON'T COME IF WEATHER IS BAD/NWS   Vital Signs:  Patient profile:   77 year old female Height:      65 inches Weight:      187 pounds BMI:     31.23 O2 Sat:      97 % on Room air Temp:     98.3 degrees F oral Pulse rate:   98 / minute BP sitting:   100 / 68  (left arm) Cuff size:   regular  Vitals Entered ByZella Ball Ewing (July 29, 2009 3:21 PM)  O2 Flow:  Room air CC: 1 Mo ROv/RE   CC:  1 Mo ROv/RE.  History of Present Illness: here after being seen jan 9 in the ER with angioedema and tongue swelling;  per pt was advised not to take the lisinopril , but due to elevation of BP she re-started the ACE a few days after stopping the prednisone and has been taking the past wk without any recurring allergy symptoms;  has ongoign pain to the left foot and needs copy of xray report to take to ortho with whom she finally has appt later today;  Pt denies CP, sob, doe, wheezing, orthopnea, pnd, worsening LE edema, palps, dizziness or syncope, but does have general weakness and BP today low normal.  Declines cbc today as she had one on jan 9 in the ER stable per pt (reviewed per echart as well);  she has been trying to follow a somewhat lower chol diet though admits to some not so low.    Problems Prior to Update: 1)  Pain in Joint, Ankle and Foot  (ICD-719.47) 2)  Anemia-nos  (ICD-285.9) 3)  Acute Gouty Arthropathy  (ICD-274.01) 4)  Allergic Rhinitis  (ICD-477.9) 5)  Diverticulosis, Colon  (ICD-562.10) 6)  Osteopenia  (ICD-733.90) 7)  Frequency, Urinary  (ICD-788.41) 8)  Preventive Health Care  (ICD-V70.0) 9)  Hypertension  (ICD-401.9) 10)  Hyperlipidemia  (ICD-272.4)  Medications Prior to Update: 1)  Lisinopril 40 Mg Tabs (Lisinopril) .... Take 1 Tablet By Mouth Once A Day 2)  Triamterene-Hctz 37.5-25 Mg Caps (Triamterene-Hctz) .... Take 1 Capsule By Mouth Once A Day 3)  Amlodipine Besylate 5 Mg Tabs (Amlodipine Besylate) .Marland Kitchen.. 1 By Mouth Once Daily 4)   Simvastatin 40 Mg Tabs (Simvastatin) .Marland Kitchen.. 1po Once Daily 5)  Alprazolam 0.25 Mg Tabs (Alprazolam) .Marland Kitchen.. 1 By Mouth Three Times A Day As Needed 6)  Oxycodone Hcl 5 Mg Tabs (Oxycodone Hcl) .Marland Kitchen.. 1 By Mouth Q 6 Hrs As Needed 7)  Prednisone 10 Mg Tabs (Prednisone) .... 4po Qd For 3days, Then 3po Qd For 3days, Then 2po Qd For 3days, Then 1po Qd For 3 Days, Then Stop  Current Medications (verified): 1)  Lisinopril 40 Mg Tabs (Lisinopril) .... Take 1 Tablet By Mouth Once A Day 2)  Triamterene-Hctz 37.5-25 Mg Caps (Triamterene-Hctz) .... Take 1 Capsule By Mouth Once A Day 3)  Amlodipine Besylate 2.5 Mg Tabs (Amlodipine Besylate) .Marland Kitchen.. 1 By Mouth Once Daily 4)  Simvastatin 40 Mg Tabs (Simvastatin) .Marland Kitchen.. 1po Once Daily 5)  Alprazolam 0.25 Mg Tabs (Alprazolam) .Marland Kitchen.. 1 By Mouth Three Times A Day As Needed 6)  Oxycodone Hcl 5 Mg Tabs (Oxycodone Hcl) .Marland Kitchen.. 1 By Mouth Q 6 Hrs As Needed  Allergies (verified): 1)  ! Penicillin 2)  ! 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   Head:  normocephalic and atraumatic.   Eyes:  vision grossly intact, pupils equal, and pupils round.   Ears:  R ear normal and L ear normal.   Nose:  no external deformity and no nasal discharge.   Mouth:  no gingival abnormalities and pharynx pink and moist.   Neck:  supple and no masses.   Lungs:  normal respiratory effort and normal breath sounds.   Heart:  normal  rate and regular rhythm.   Msk:  left foot dorsal swelling and tender noted on palpation with sock on;  foot for further eval per orhto later today Extremities:  no edema, no erythema    Impression & Recommendations:  Problem # 1:  HYPERTENSION (ICD-401.9)  Her updated medication list for this problem includes:    Lisinopril 40 Mg Tabs (Lisinopril) .Marland Kitchen... Take 1 tablet by mouth once a day    Triamterene-hctz 37.5-25 Mg Caps (Triamterene-hctz) .Marland Kitchen... Take 1 capsule by mouth once a day    Amlodipine Besylate 2.5 Mg Tabs (Amlodipine besylate) .Marland Kitchen... 1 by mouth once daily ok to decrease the amlod to 2.5 mg  Problem # 2:  PAIN IN JOINT, ANKLE AND FOOT (ICD-719.47) has f/u appt today with ortho, refills pain med done  Problem # 3:  ANEMIA-NOS (ICD-285.9) stable overall by hx and exam, ok to continue meds/tx as is , echart reviewed  Problem # 4:  HYPERLIPIDEMIA (ICD-272.4)  Her updated medication list for this problem includes:    Simvastatin 40 Mg Tabs (Simvastatin) .Marland Kitchen... 1po once daily  Labs Reviewed: SGOT: 22 (03/26/2008)   SGPT: 17 (03/26/2008)   HDL:49.1 (03/26/2008), 48.1 (09/28/2006)  LDL:DEL (03/26/2008), DEL (09/28/2006)  Chol:264 (03/26/2008), 248 (09/28/2006)  Trig:161 (03/26/2008), 157 (09/28/2006) tolerating med well, for f/u labs next visit, Pt to continue diet efforts, good med tolerance;- goal LDL less than 100  Complete Medication List: 1)  Lisinopril 40 Mg Tabs (Lisinopril) .... Take 1 tablet by mouth once a day 2)  Triamterene-hctz 37.5-25 Mg Caps (Triamterene-hctz) .... Take 1 capsule by mouth once a day 3)  Amlodipine Besylate 2.5 Mg Tabs (Amlodipine besylate) .Marland Kitchen.. 1 by mouth once daily 4)  Simvastatin 40 Mg Tabs (Simvastatin) .Marland Kitchen.. 1po once daily 5)  Alprazolam 0.25 Mg Tabs (Alprazolam) .Marland Kitchen.. 1 by mouth three times a day as needed 6)  Oxycodone Hcl 5 Mg Tabs (Oxycodone hcl) .Marland Kitchen.. 1 by mouth q 6 hrs as needed  Patient Instructions: 1)  you are given the pain medicine  refill today 2)  decrease the amlodipine to 2.5 mg per day  3)  Continue all previous medications as before this visit  4)  Please schedule a follow-up appointment in 3 months. Prescriptions: AMLODIPINE BESYLATE 2.5 MG TABS (AMLODIPINE BESYLATE) 1 by mouth once daily  #90 x 3   Entered and Authorized by:   Corwin Levins MD   Signed by:   Corwin Levins MD on 07/29/2009   Method used:   Faxed to ...       [facility] (retail)       [address]  [place], [place]  [address]       Ph: [phone]       Fax: [phone]   RxID:   [id number] AMLODIPINE BESYLATE 2.5 MG TABS (AMLODIPINE BESYLATE) 1 by mouth once daily  #90 x 3   Entered and Authorized by:   Corwin Levins MD   Signed by:   Corwin Levins MD on 07/29/2009   Method used:   Print then Give to Patient   RxID:   [id number] OXYCODONE HCL 5 MG TABS (OXYCODONE HCL) 1 by mouth q 6 hrs as needed  #120 x 0   Entered and Authorized by:   Corwin Levins MD   Signed by:   Corwin Levins MD on 07/29/2009   Method used:   Print then Give to Patient   RxID:   [id number]

## 2010-08-04 NOTE — Progress Notes (Signed)
Summary: referral  Phone Note Call from Patient Call back at Home Phone [phone]   Caller: Daughter Sydney Lowery Summary of Call: pt's daughter called requesting pt's last OV be faxed to Ortho for referral as well. pt's daughter also says that Dr. Ophelia Charter' office has not received a referral for pt to have Ortho eval with him. I will fax last OV. please advise on referral. Initial call taken by: [name] Pyle, CMA,  July 25, 2009 10:32 AM  Follow-up for Phone Call        to pcc's to handle Follow-up by: Corwin Levins MD,  July 25, 2009 1:19 PM  Additional Follow-up for Phone Call Additional follow up Details #1::        please contact pt's daugther Sydney Lowery @ [phone] when referral is sent. pt already has appt for 07/30/2009. thanks Additional Follow-up by: [name] Pyle, CMA,  July 25, 2009 2:50 PM    Additional Follow-up for Phone Call Additional follow up Details #2::    referral sent to  Dr Ophelia Charter office  and ov notes  Follow-up by: Shelbie Proctor,  July 28, 2009 8:36 AM

## 2010-08-04 NOTE — Progress Notes (Signed)
Summary: TRIAMTERENE & LISINOPRIL  Phone Note From Pharmacy   Caller: Jadene Pierini / [facility] Reason for Call: Needs renewal Summary of Call: Requesting renewal on Lisinopril 40mg  take 1 by mouth once daily # 30 & Triamterene/hctz 37.5/25mg  take 1 by mouth once daily # 30. Both last filled on 01/16/2008. Initial call taken by: Orlan Leavens,  February 16, 2008 10:33 AM  Follow-up for Phone Call        FAXED RENEWAL TO [facility]. NEED FOLLOW-UP APPT FOR ADDTIONAL REFILLS Follow-up by: Orlan Leavens,  February 16, 2008 10:35 AM    New/Updated Medications: LISINOPRIL 40 MG TABS (LISINOPRIL) Take 1 tablet by mouth once a day NEED FOLLOW-UP APPT NO ADDTIONAL REFILLS UNTIL APPT TRIAMTERENE-HCTZ 37.5-25 MG CAPS (TRIAMTERENE-HCTZ) Take 1 capsule by mouth once a dayNEED FOLLOW-UP APPT FOR ADDTIONAL REFILLS   Prescriptions: TRIAMTERENE-HCTZ 37.5-25 MG CAPS (TRIAMTERENE-HCTZ) Take 1 capsule by mouth once a dayNEED FOLLOW-UP APPT FOR ADDTIONAL REFILLS  #30 x 1   Entered by:   Orlan Leavens   Authorized by:   Corwin Levins MD   Signed by:   Orlan Leavens on 02/16/2008   Method used:   Faxed to ...       Jadene Pierini / [facility]       [address]       [place], [place]  [address]       Ph: [phone]       Fax: [phone]   RxID:   [id number] LISINOPRIL 40 MG TABS (LISINOPRIL) Take 1 tablet by mouth once a day NEED FOLLOW-UP APPT NO ADDTIONAL REFILLS UNTIL APPT  #30 x 1   Entered by:   Orlan Leavens   Authorized by:   Corwin Levins MD   Signed by:   Orlan Leavens on 02/16/2008   Method used:   Faxed to ...       Jadene Pierini / [facility]       [address]       [place], [place]  [address]       Ph: [phone]       Fax: [phone]   RxID:   [id number]

## 2010-08-04 NOTE — Progress Notes (Signed)
Summary: lisinopril  Phone Note Refill Request Message from:  Fax from Pharmacy on April 21, 2009 4:05 PM  Refills Requested: Medication #1:  LISINOPRIL 40 MG TABS Take 1 tablet by mouth once a day   Last Refilled: 03/18/2009  Method Requested: Electronic Initial call taken by: Orlan Leavens,  April 21, 2009 4:05 PM    Prescriptions: LISINOPRIL 40 MG TABS (LISINOPRIL) Take 1 tablet by mouth once a day Brand medically necessary #30 x 0   Entered by:   Orlan Leavens   Authorized by:   Corwin Levins MD   Signed by:   Orlan Leavens on 04/21/2009   Method used:   Faxed to ...       [facility] (retail)       [address]       [place], [place]  [address]       Ph: [phone]       Fax: [phone]   RxID:   [id number]

## 2010-08-04 NOTE — Progress Notes (Signed)
  Phone Note Refill Request   Refills Requested: Medication #1:  LISINOPRIL 40 MG TABS Take 1 tablet by mouth once a day [BMN]   Dosage confirmed as above?Dosage Confirmed   Notes: [facility] Initial call taken by: Scharlene Gloss,  May 26, 2009 3:04 PM    Prescriptions: LISINOPRIL 40 MG TABS (LISINOPRIL) Take 1 tablet by mouth once a day Brand medically necessary #30 x 0   Entered by:   Scharlene Gloss   Authorized by:   Corwin Levins MD   Signed by:   Scharlene Gloss on 05/26/2009   Method used:   Faxed to ...       [facility] (retail)       [address]       [place], [place]  [address]       Ph: [phone]       Fax: [phone]   RxID:   [id number]

## 2010-08-04 NOTE — Miscellaneous (Signed)
Summary: Plan of Care & Treatment/[facility]  Plan of Care & Treatment/[facility]   Imported By: Sherian Rein 06/11/2009 07:59:27  _____________________________________________________________________  External Attachment:    Type:   Image     Comment:   External Document

## 2010-08-04 NOTE — Miscellaneous (Signed)
Summary: Orders Update   Clinical Lists Changes  Problems: Added new problem of PAIN IN JOINT, ANKLE AND FOOT (ICD-719.47) Orders: Added new Referral order of Orthopedic Surgeon Referral (Ortho Surgeon) - Signed

## 2010-08-04 NOTE — Miscellaneous (Signed)
   Clinical Lists Changes  Problems: Added new problem of OSTEOPENIA (ICD-733.90) Added new problem of DIVERTICULOSIS, COLON (ICD-562.10) Added new problem of ALLERGIC RHINITIS (ICD-477.9) Observations: Added new observation of PH HAYFEVER: yes (03/27/2008 18:26) Added new observation of DVTICLOSISHX: yes (03/27/2008 18:26) Added new observation of PMH REVIEWED: reviewed - no changes required (03/27/2008 18:26) Added new observation of PAST MED HX: Hyperlipidemia Hypertension arithritis Osteopenia Diverticulosis, colon Allergic rhinitis hx of shingles  (03/27/2008 18:26) Added new observation of PMH OSTEOPEN: yes (03/27/2008 18:26) Added new observation of COLONOSCOPY: Diverticulosis (10/26/2004 18:28) Added new observation of BONE DENSITY: abnormal (09/24/2004 18:27)     Preventive Care Screening  Colonoscopy:    Date:  10/26/2004    Results:  Diverticulosis  Bone Density:    Date:  09/24/2004    Results:  abnormal    Preventive Care Screening  Colonoscopy:    Date:  10/26/2004    Results:  Diverticulosis  Bone Density:    Date:  09/24/2004    Results:  abnormal   Past Medical History:    Reviewed history from 02/22/2007 and no changes required:       Hyperlipidemia       Hypertension       arithritis       Osteopenia       Diverticulosis, colon       Allergic rhinitis       hx of shingles

## 2010-08-04 NOTE — Assessment & Plan Note (Signed)
Summary: FU--$50--OUT OF BP MED---STC   Vital Signs:  Patient Profile:   77 Years Old Female Weight:      175.8 pounds Temp:     97.2 degrees F oral Pulse rate:   94 / minute BP sitting:   168 / 96  (left arm) Cuff size:   regular  Vitals Entered By: Payton Spark CMA (March 26, 2008 2:09 PM)                 Chief Complaint:  Rx refills.  History of Present Illness: more stress lately with daughter having surgury soon; BP at home just over 140 usually, today even higher, take the lisinopril 40 very well, and may miss the water pill rarely; but also has very freq urinary freq to the point of every 20 during the day, and gets up usually 3 times at night; ongoing for many months, at least over 6 mo, no pain or hematuria, no fever or flank pain    Updated Prior Medication List: LISINOPRIL 40 MG TABS (LISINOPRIL) Take 1 tablet by mouth once a day TRIAMTERENE-HCTZ 37.5-25 MG CAPS (TRIAMTERENE-HCTZ) Take 1 capsule by mouth once a day AMLODIPINE BESYLATE 5 MG TABS (AMLODIPINE BESYLATE) 1 by mouth once daily SIMVASTATIN 40 MG TABS (SIMVASTATIN) 1po once daily DETROL LA 4 MG XR24H-CAP (TOLTERODINE TARTRATE) 1 by mouth once daily ADULT ASPIRIN EC LOW STRENGTH 81 MG TBEC (ASPIRIN) 1 by mouth once daily  Current Allergies (reviewed today): ! PENICILLIN ! 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         all otherwise negative    Physical Exam  General:     alert and overweight-appearing.   Head:     Normocephalic and atraumatic without obvious abnormalities. No apparent alopecia or balding. Eyes:     No corneal or conjunctival inflammation noted. EOMI. Perrla.  Ears:     External ear exam shows no significant lesions or deformities.  Otoscopic examination reveals clear canals, tympanic membranes are intact bilaterally without bulging, retraction, inflammation or discharge. Hearing is grossly normal bilaterally. Nose:     External nasal examination shows no deformity or inflammation. Nasal  mucosa are pink and moist without lesions or exudates. Mouth:     Oral mucosa and oropharynx without lesions or exudates.  Teeth in good repair. Neck:     No deformities, masses, or tenderness noted. Lungs:     Normal respiratory effort, chest expands symmetrically. Lungs are clear to auscultation, no crackles or wheezes. Heart:     Normal rate and regular rhythm. S1 and S2 normal without gallop, murmur, click, rub or other extra sounds. Abdomen:     Bowel sounds positive,abdomen soft and non-tender without masses, organomegaly or hernias noted. Msk:     no joint tenderness and no joint swelling.   Extremities:     no edema, no ulcers  Neurologic:     alert & oriented X3, cranial nerves II-XII intact, and  strength normal in all extremities.      Impression & Recommendations:  Problem # 1:  Preventive Health Care (ICD-V70.0) Overall doing well, up to date, counseled on routine health concerns for screening and prevention, immunizations up to date or declined, labs reviewed, ecg reviewed \\par    Orders: EKG w/ Interpretation (93000) TLB-BMP (Basic Metabolic Panel-BMET) (80048-METABOL) TLB-CBC Platelet - w/Differential (85025-CBCD) TLB-Hepatic/Liver Function Pnl (80076-HEPATIC) TLB-Lipid Panel (80061-LIPID) TLB-TSH (Thyroid Stimulating Hormone) (84443-TSH) T-Vitamin D (25-Hydroxy) (47829-56213)   Problem # 2:  FREQUENCY, URINARY (ICD-788.41)  Her updated medication list for this problem includes:    Detrol La 4 Mg Xr24h-cap (Tolterodine tartrate) .Marland Kitchen... 1 by mouth once daily  Orders: TLB-Udip w/ Micro (81001-URINE) T-Culture, Urine (08657-84696) check labs ,start meds  Problem # 3:  HYPERTENSION (ICD-401.9)  The following medications were removed from the medication list:    Lisinopril 20 Mg Tabs (Lisinopril) .Marland Kitchen... Take 1 tablet by mouth once a day  Her updated medication list for this problem includes:    Lisinopril 40 Mg Tabs (Lisinopril) .Marland Kitchen... Take 1 tablet by mouth once a day    Triamterene-hctz 37.5-25 Mg Caps (Triamterene-hctz) .Marland Kitchen... Take 1 capsule by mouth once a day    Amlodipine Besylate 5 Mg Tabs (Amlodipine besylate) .Marland Kitchen... 1 by mouth once daily meds adjusted  Problem # 4:  HYPERLIPIDEMIA (ICD-272.4)  Her updated medication list for this problem includes:    Simvastatin 40 Mg Tabs (Simvastatin) .Marland Kitchen... 1po once daily re-start statin  Complete Medication List: 1)  Lisinopril 40 Mg Tabs (Lisinopril) .... Take 1 tablet by mouth once a day 2)  Triamterene-hctz 37.5-25 Mg Caps (Triamterene-hctz) .... Take 1 capsule by mouth once a day 3)  Amlodipine Besylate 5 Mg Tabs (Amlodipine besylate) .Marland Kitchen.. 1 by mouth once daily 4)  Simvastatin 40 Mg Tabs (Simvastatin) .Marland Kitchen.. 1po once  daily 5)  Detrol La 4 Mg Xr24h-cap (Tolterodine tartrate) .Marland Kitchen.. 1 by mouth once daily 6)  Adult Aspirin Ec Low Strength 81 Mg Tbec (Aspirin) .Marland Kitchen.. 1 by mouth once daily   Patient Instructions: 1)  start the detrol LA at 4 mg - 1 per day for the bladder problem 2)  start the amlodipine 5 mg - 1 per day - for the blood pressure 3)  continue the other 2 medications for the blood pressure 4)  start the simvastatin 40 mg - 1 per day - for cholesterol 5)  Take an Aspirin every day - 81 mg - 1 per day - COATEd only - to reduce risk of stroke and heart attack 6)  Please go to the Lab in the basement for your blood tests today 7)  Please schedule a follow-up appointment in 1 month to followup  on the blood pressure and cholesterol and bladder problem   Prescriptions: DETROL LA 4 MG XR24H-CAP (TOLTERODINE TARTRATE) 1 by mouth once daily  #30 x 11   Entered and Authorized by:   Corwin Levins MD   Signed by:   Corwin Levins MD on 03/26/2008   Method used:   Print then Give to Patient   RxID:   [id number] SIMVASTATIN 40 MG TABS (SIMVASTATIN) 1po once daily  #30 x 11   Entered and Authorized by:   Corwin Levins MD   Signed by:   Corwin Levins MD on 03/26/2008   Method used:   Print then Give to Patient   RxID:   [id number] LISINOPRIL 40 MG TABS (LISINOPRIL) Take 1 tablet by mouth once a day  #30 x 11   Entered and Authorized by:   Corwin Levins MD   Signed by:   Corwin Levins MD on 03/26/2008   Method used:   Print then Give to Patient   RxID:   [id number] TRIAMTERENE-HCTZ 37.5-25 MG CAPS (TRIAMTERENE-HCTZ) Take 1 capsule by mouth once a day  #30 x 11   Entered and Authorized by:   Corwin Levins MD   Signed by:   Corwin Levins MD on 03/26/2008   Method used:   Print then Give to Patient   RxID:   [id number] AMLODIPINE BESYLATE 5 MG TABS (AMLODIPINE BESYLATE) 1 by mouth once daily  #30 x 11   Entered and Authorized by:   Corwin Levins MD   Signed by:   Corwin Levins MD on  03/26/2008   Method used:   Print then Give to Patient   RxID:   [id number]  ]

## 2010-08-04 NOTE — Assessment & Plan Note (Signed)
Summary: MED---STC   Vital Signs:  Patient profile:   77 year old female Height:      65 inches Weight:      180 pounds BMI:     30.06 O2 Sat:      95 % on Room air Temp:     98.7 degrees F oral Pulse rate:   86 / minute BP sitting:   110 / 60  (left arm) Cuff size:   regular  Vitals Entered ByZella Ball Ewing (September 23, 2009 2:19 PM)  O2 Flow:  Room air  Preventive Care Screening  Last Pneumovax:    Date:  05/05/2009    Results:  given   CC: Medication for Gout, Discuss MRI results/RE   CC:  Medication for Gout and Discuss MRI results/RE.  History of Present Illness: here after seeing ortho for left foot (dr blackmon) who offered surgury but she has declines foot fusion for now;  he rec'd coming back here for chronic pain meds, as well as gout prevention meds if needed;  also had MRI LS spine (limited) to eval for paraspinous mass but no swellling since that time;  Pt denies CP, sob, doe, wheezing, orthopnea, pnd, worsening LE edema, palps, dizziness or syncope   Pt denies new neuro symptoms such as headache, facial or extremity weakness   Overall good compliance with med and tolerating well.  Trying to follow low chol diet but admits to some noncompliance  Here for wellness Diet: Heart Healthy or DM if diabetic Physical Activities: Sedentary Depression/mood screen: Negative Hearing: mild loss bilateral Visual Acuity: Grossly normal ADL's: needs assist due to chronic foot pain and marked decreased ambulation ability  Fall Risk: mod to high Home Safety: Good End-of-Life Planning: Advance directive - Full code/I agree     Problems Prior to Update: 1)  Gout  (ICD-274.9) 2)  Pain in Joint, Ankle and Foot  (ICD-719.47) 3)  Anemia-nos  (ICD-285.9) 4)  Acute Gouty Arthropathy  (ICD-274.01) 5)  Allergic Rhinitis  (ICD-477.9) 6)  Diverticulosis, Colon  (ICD-562.10) 7)  Osteopenia  (ICD-733.90) 8)  Frequency, Urinary  (ICD-788.41) 9)  Preventive Health Care  (ICD-V70.0) 10)   Hypertension  (ICD-401.9) 11)  Hyperlipidemia  (ICD-272.4)  Medications Prior to Update: 1)  Lisinopril 40 Mg Tabs (Lisinopril) .... Take 1 Tablet By Mouth Once A Day 2)  Triamterene-Hctz 37.5-25 Mg Caps (Triamterene-Hctz) .... Take 1 Capsule By Mouth Once A Day 3)  Amlodipine Besylate 2.5 Mg Tabs (Amlodipine Besylate) .Marland Kitchen.. 1 By Mouth Once Daily 4)  Simvastatin 40 Mg Tabs (Simvastatin) .Marland Kitchen.. 1po Once Daily 5)  Alprazolam 0.25 Mg Tabs (Alprazolam) .Marland Kitchen.. 1 By Mouth Three Times A Day As Needed 6)  Oxycodone Hcl 5 Mg Tabs (Oxycodone Hcl) .Marland Kitchen.. 1 By Mouth Q 6 Hrs As Needed  Current Medications (verified): 1)  Lisinopril 40 Mg Tabs (Lisinopril) .... Take 1 Tablet By Mouth Once A Day 2)  Triamterene-Hctz 37.5-25 Mg Caps (Triamterene-Hctz) .... Take 1 Capsule By Mouth Once A Day 3)  Amlodipine Besylate 2.5 Mg Tabs (Amlodipine Besylate) .Marland Kitchen.. 1 By Mouth Once Daily 4)  Simvastatin 40 Mg Tabs (Simvastatin) .Marland Kitchen.. 1po Once Daily 5)  Alprazolam 0.25 Mg Tabs (Alprazolam) .Marland Kitchen.. 1 By Mouth Three Times A Day As Needed 6)  Hydrocodone-Acetaminophen 5-325 Mg Tabs (Hydrocodone-Acetaminophen) .Marland Kitchen.. 1 By Mouth Q 6 Hrs As Needed 7)  Ecotrin Low Strength 81 Mg Tbec (Aspirin) .Marland Kitchen.. 1 By Mouth Once Daily 8)  Celebrex 200 Mg Caps (Celecoxib) .Marland Kitchen.. 1 By Mouth Two  Times A Day As Needed  Allergies (verified): 1)  ! Penicillin 2)  ! 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  Risk Factors: Smoking Status: current (2008/04/11)  Past Medical History: Hyperlipidemia Hypertension arithritis Osteopenia Diverticulosis, colon Allergic rhinitis hx of shingles Anemia-NOS Gout  Review  of Systems  The patient denies anorexia, fever, vision loss, decreased hearing, hoarseness, chest pain, syncope, dyspnea on exertion, peripheral edema, prolonged cough, hemoptysis, abdominal pain, melena, hematochezia, severe indigestion/heartburn, hematuria, muscle weakness, suspicious skin lesions, difficulty walking, unusual weight change, abnormal bleeding, enlarged lymph nodes, and angioedema.         all otherwise negative per pt -    Physical Exam  General:  alert and overweight-appearing.   Head:  normocephalic and atraumatic.   Eyes:  vision grossly intact, pupils equal, and pupils round.   Ears:  R ear normal and L ear normal.   Nose:  no external deformity and no nasal discharge.   Mouth:  no gingival abnormalities and pharynx pink and moist.   Neck:  supple and no masses.   Lungs:  normal respiratory effort and normal breath sounds.   Heart:  normal rate and regular rhythm.   Abdomen:  soft, non-tender, and normal bowel sounds.   Msk:  no joint tenderness and no joint swelling. except for chronic left foot Extremities:  no edema, no erythema  Neurologic:  cranial nerves II-XII intact and strength normal in all extremities but reduced ROM left foot and ankle with pain Skin:  color normal and no rashes.   Psych:  not depressed appearing and slightly anxious.     Impression & Recommendations:  Problem # 1:  Preventive Health Care (ICD-V70.0)  Overall doing well, age appropriate education and counseling updated and referral for appropriate preventive services done unless declined, immunizations up to date or declined, diet counseling done if overweight, urged to quit smoking if smokes , most recent labs reviewed and current ordered if appropriate, ecg reviewed or declined (interpretation per ECG scanned in the EMR if done); information regarding Medicare Prevention requirements given if appropriate   Orders: T-Vitamin D (25-Hydroxy) ([address]-60[address]) TLB-BMP (Basic Metabolic  Panel-BMET) (80048-METABOL) TLB-CBC Platelet - w/Differential (85025-CBCD) TLB-Hepatic/Liver Function Pnl (80076-HEPATIC) TLB-Lipid Panel (80061-LIPID) TLB-TSH (Thyroid Stimulating Hormone) (84443-TSH) TLB-Udip ONLY (81003-UDIP) First annual wellness visit with prevention plan  (U9811)  Problem # 2:  PAIN IN JOINT, ANKLE AND FOOT (ICD-719.47) chronic, has declined foot fusion; for chronic pain med refill  Problem # 3:  GOUT (ICD-274.9)  for celebrex as needed, check uric acid  Orders: Prescription Created Electronically (B1478)  Problem # 4:  ANEMIA-NOS (ICD-285.9) no overt bleeding or bruising, o/w asympt - to check f/u labs Orders: TLB-B12 + Folate Pnl ([id number]) TLB-IBC Pnl (Iron/FE;Transferrin) (83550-IBC)  Problem # 5:  HYPERLIPIDEMIA (ICD-272.4)  Her updated medication list for this problem includes:    Simvastatin 40 Mg Tabs (Simvastatin) .Marland Kitchen... 1po once daily  Labs Reviewed: SGOT: 22 (04/11/08)   SGPT: 17 (Apr 11, 2008)   HDL:49.1 (April 11, 2008), 48.1 (09/28/2006)  LDL:DEL (2008/04/11), DEL (09/28/2006)  Chol:264 (04/11/08), 248 (09/28/2006)  Trig:161 (April 11, 2008), 157 (09/28/2006) stable overall by hx and exam, ok to continue meds/tx as is , Pt to continue diet efforts, good  med tolerance; to check labs - goal LDL less than 100  Problem # 6:  HYPERTENSION (ICD-401.9)  Her updated medication list for this problem includes:    Lisinopril 40 Mg Tabs (Lisinopril) .Marland Kitchen... Take 1 tablet by mouth once a day    Triamterene-hctz 37.5-25 Mg Caps (Triamterene-hctz) .Marland Kitchen... Take 1 capsule by mouth once a day    Amlodipine Besylate 2.5 Mg Tabs (Amlodipine besylate) .Marland Kitchen... 1 by mouth once daily  BP today: 110/60 Prior BP: 100/68 (07/29/2009)  Labs Reviewed: K+: 4.1 (03/26/2008) Creat: : 1.1 (03/26/2008)   Chol: 264 (03/26/2008)   HDL: 49.1 (03/26/2008)   LDL: DEL (03/26/2008)   TG: 161 (03/26/2008) stable overall by hx and exam, ok to continue meds/tx as is   Complete  Medication List: 1)  Lisinopril 40 Mg Tabs (Lisinopril) .... Take 1 tablet by mouth once a day 2)  Triamterene-hctz 37.5-25 Mg Caps (Triamterene-hctz) .... Take 1 capsule by mouth once a day 3)  Amlodipine Besylate 2.5 Mg Tabs (Amlodipine besylate) .Marland Kitchen.. 1 by mouth once daily 4)  Simvastatin 40 Mg Tabs (Simvastatin) .Marland Kitchen.. 1po once daily 5)  Alprazolam 0.25 Mg Tabs (Alprazolam) .Marland Kitchen.. 1 by mouth three times a day as needed 6)  Hydrocodone-acetaminophen 5-325 Mg Tabs (Hydrocodone-acetaminophen) .Marland Kitchen.. 1 by mouth q 6 hrs as needed 7)  Ecotrin Low Strength 81 Mg Tbec (Aspirin) .Marland Kitchen.. 1 by mouth once daily 8)  Celebrex 200 Mg Caps (Celecoxib) .Marland Kitchen.. 1 by mouth two times a day as needed  Other Orders: TD Toxoids IM 7 YR + ([address]) Admin 1st Vaccine ([address]) TLB-Uric Acid, Blood (84550-URIC)  Patient Instructions: 1)  Take an Aspirin every day - 81 mg -1 per day - COATED only 2)  Please take all new medications as prescribed - the vicodin 5/325 mg as needed pain  3)  start the celebrex for inflammation and pain as well 4)  stop the indomethacin 5)  Continue all previous medications as before this visit 6)  Please go to the Lab in the basement for your blood and/or urine tests today 7)  you had the tetanus shot today 8)  Please schedule a follow-up appointment in 6 months. Prescriptions: HYDROCODONE-ACETAMINOPHEN 5-325 MG TABS (HYDROCODONE-ACETAMINOPHEN) 1 by mouth q 6 hrs as needed  #120 x 3   Entered and Authorized by:   Corwin Levins MD   Signed by:   Corwin Levins MD on 09/23/2009   Method used:   Print then Give to Patient   RxID:   [id number] CELEBREX 200 MG CAPS (CELECOXIB) 1 by mouth two times a day as needed  #60 x 11   Entered and Authorized by:   Corwin Levins MD   Signed by:   Corwin Levins MD on 09/23/2009   Method used:   Print then Give to Patient   RxID:   [id number]    Immunizations Administered:  Tetanus Vaccine:    Vaccine Type: Td    Site: left deltoid    Mfr:  GlaxoSmithKline    Dose: 0.5 ml    Route: IM    Given by: Zella Ball Ewing    Exp. Date: 07/18/2011    Lot #: [id number]    VIS given: 05/23/07 version given September 23, 2009.

## 2010-08-04 NOTE — Miscellaneous (Signed)
   Clinical Lists Changes  Medications: Rx of TRIAMTERENE-HCTZ 37.5-25 MG CAPS (TRIAMTERENE-HCTZ) Take 1 capsule by mouth once a day;  #30 x 1;  Signed;  Entered by: Maris Berger;  Authorized by: Corwin Levins MD;  Method used: Telephoned to [facility], [address] [place], [place], [place]  [address], Ph: [phone], Fax: [phone]    Prescriptions: TRIAMTERENE-HCTZ 37.5-25 MG CAPS (TRIAMTERENE-HCTZ) Take 1 capsule by mouth once a day  #30 x 1   Entered by:   Maris Berger   Authorized by:   Corwin Levins MD   Signed by:   Maris Berger on 11/10/2007   Method used:   Telephoned to ...       [facility] / [facility]       [address]       [place], [place]  [address]       Ph: [phone]       Fax: [phone]   RxID:   [id number]

## 2010-08-04 NOTE — Letter (Signed)
Summary: [facility]  [facility]   Imported By: Sherian Rein 07/09/2009 11:35:07  _____________________________________________________________________  External Attachment:    Type:   Image     Comment:   External Document

## 2010-08-04 NOTE — Medication Information (Signed)
Summary: Celebrex/[employer]  Celebrex/[employer]   Imported By: Sherian Rein 10/20/2009 13:24:23  _____________________________________________________________________  External Attachment:    Type:   Image     Comment:   External Document

## 2010-08-04 NOTE — Progress Notes (Signed)
Summary: swollen foot  Phone Note Call from Patient   Caller: Daughter/ [name] [phone] Reason for Call: Talk to Doctor Summary of Call: Mother saw md 2 weeks ago for swelling in her foot. Md referred her to see orthopedic md appt not until 07/29/09. Mom foot is swollen even more. want to know can md prescribe something. 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

## 2010-08-04 NOTE — Miscellaneous (Signed)
  Clinical Lists Changes  Medications: Rx of TRIAMTERENE-HCTZ 37.5-25 MG CAPS (TRIAMTERENE-HCTZ) Take 1 capsule by mouth once a day;  #30 x 3;  Signed;  Entered by: Maris Berger;  Authorized by: Corwin Levins MD;  Method used: Telephoned to [facility], [address] [place], [place], [place]  [address], Ph: [phone], Fax: [phone]    Prescriptions: TRIAMTERENE-HCTZ 37.5-25 MG CAPS (TRIAMTERENE-HCTZ) Take 1 capsule by mouth once a day  #30 x 3   Entered by:   Maris Berger   Authorized by:   Corwin Levins MD   Signed by:   Maris Berger on 07/28/2007   Method used:   Telephoned to ...       [facility] / [facility]       [address]       [place], [place]  [address]       Ph: [phone]       Fax: [phone]   RxID:   [id number]

## 2010-08-04 NOTE — Progress Notes (Signed)
Summary: Referral Ortho  Phone Note Call from Patient Call back at Home Phone [phone]   Caller: Daughter  Summary of Call: pt daughter called to inform MD that pt has appt with [facility] (Dr.Yates) 07/29/2009. Ortho office is requesting copy of X-ray and referral be sent before pt appt in order for her to be seen. Initial call taken by: [name] Pyle, CMA,  July 21, 2009 11:24 AM  Follow-up for Phone Call        please fax the xray report to dr yates per the EMR Follow-up by: Corwin Levins MD,  July 21, 2009 1:09 PM  Additional Follow-up for Phone Call Additional follow up Details #1::        x-ray faxed to Dr.Yates Additional Follow-up by: [name] Pyle, CMA,  July 21, 2009 4:21 PM

## 2010-08-04 NOTE — Miscellaneous (Signed)
Summary: [facility]  [facility]   Imported By: Lester [place] 07/03/2009 11:56:27  _____________________________________________________________________  External Attachment:    Type:   Image     Comment:   External Document

## 2010-08-04 NOTE — Progress Notes (Signed)
Summary: Genevieve Norlander Therapist  Phone Note Other Incoming   Caller: [facility] Therapist Genevieve Norlander [phone] Summary of Call: Therapist called to ask MD if pt can bear weight on LT foot and if she will need assistance with mobility. MD states that pt can nopt do weight bering on LT foot at this time and will need a wheelchair. Therapist informed Initial call taken by: [name] Pyle, CMA,  June 26, 2009 2:18 PM     Appended Document: Genevieve Norlander Therapist rx for wheelchair - done hardcopy to [facility] side B - dahlia   Appended Document: Genevieve Norlander Therapist Rx faxed to [address] [phone] per Drinda Butts

## 2010-09-20 LAB — CBC
HCT: 34.7 % — ABNORMAL LOW (ref 36.0–46.0)
Hemoglobin: 11.6 g/dL — ABNORMAL LOW (ref 12.0–15.0)
MCHC: 33.3 g/dL (ref 30.0–36.0)
MCV: 90.9 fL (ref 78.0–100.0)
Platelets: 255 10*3/uL (ref 150–400)
RBC: 3.82 MIL/uL — ABNORMAL LOW (ref 3.87–5.11)
RDW: 16.7 % — ABNORMAL HIGH (ref 11.5–15.5)
WBC: 8.3 10*3/uL (ref 4.0–10.5)

## 2010-09-20 LAB — BASIC METABOLIC PANEL
BUN: 16 mg/dL (ref 6–23)
CO2: 30 mEq/L (ref 19–32)
Calcium: 9.4 mg/dL (ref 8.4–10.5)
Chloride: 102 mEq/L (ref 96–112)
Creatinine, Ser: 0.96 mg/dL (ref 0.4–1.2)
GFR calc Af Amer: >60 mL/min (ref >60)
GFR calc non Af Amer: 56 mL/min — ABNORMAL LOW (ref >60)
Glucose, Bld: 101 mg/dL — ABNORMAL HIGH (ref 70–99)
Potassium: 3.7 mEq/L (ref 3.5–5.1)
Sodium: 142 mEq/L (ref 135–145)

## 2010-09-20 LAB — DIFFERENTIAL
Basophils Absolute: 0 10*3/uL (ref 0.0–0.1)
Basophils Relative: 0 % (ref 0–1)
Eosinophils Absolute: 0.1 10*3/uL (ref 0.0–0.7)
Eosinophils Relative: 1 % (ref 0–5)
Lymphocytes Relative: 12 % (ref 12–46)
Lymphs Abs: 1 10*3/uL (ref 0.7–4.0)
Monocytes Absolute: 0.5 10*3/uL (ref 0.1–1.0)
Monocytes Relative: 6 % (ref 3–12)
Neutro Abs: 6.7 10*3/uL (ref 1.7–7.7)
Neutrophils Relative %: 80 % — ABNORMAL HIGH (ref 43–77)

## 2010-10-07 LAB — BASIC METABOLIC PANEL
BUN: 17 mg/dL (ref 6–23)
BUN: 4 mg/dL — ABNORMAL LOW (ref 6–23)
BUN: 7 mg/dL (ref 6–23)
CO2: 24 mEq/L (ref 19–32)
CO2: 27 mEq/L (ref 19–32)
CO2: 30 mEq/L (ref 19–32)
Calcium: 8.4 mg/dL (ref 8.4–10.5)
Calcium: 8.5 mg/dL (ref 8.4–10.5)
Calcium: 8.8 mg/dL (ref 8.4–10.5)
Chloride: 106 mEq/L (ref 96–112)
Chloride: 108 mEq/L (ref 96–112)
Chloride: 108 mEq/L (ref 96–112)
Creatinine, Ser: 0.85 mg/dL (ref 0.4–1.2)
Creatinine, Ser: 0.9 mg/dL (ref 0.4–1.2)
Creatinine, Ser: 0.93 mg/dL (ref 0.4–1.2)
GFR calc Af Amer: >60 mL/min (ref >60)
GFR calc Af Amer: >60 mL/min (ref >60)
GFR calc Af Amer: >60 mL/min (ref >60)
GFR calc non Af Amer: 58 mL/min — ABNORMAL LOW (ref >60)
GFR calc non Af Amer: >60 mL/min (ref >60)
GFR calc non Af Amer: >60 mL/min (ref >60)
Glucose, Bld: 139 mg/dL — ABNORMAL HIGH (ref 70–99)
Glucose, Bld: 85 mg/dL (ref 70–99)
Glucose, Bld: 93 mg/dL (ref 70–99)
Potassium: 3.3 mEq/L — ABNORMAL LOW (ref 3.5–5.1)
Potassium: 3.5 mEq/L (ref 3.5–5.1)
Potassium: 4.2 mEq/L (ref 3.5–5.1)
Sodium: 140 mEq/L (ref 135–145)
Sodium: 141 mEq/L (ref 135–145)
Sodium: 141 mEq/L (ref 135–145)

## 2010-10-07 LAB — URINE MICROSCOPIC-ADD ON

## 2010-10-07 LAB — DIFFERENTIAL
Basophils Absolute: 0 10*3/uL (ref 0.0–0.1)
Basophils Absolute: 0 10*3/uL (ref 0.0–0.1)
Basophils Absolute: 0 10*3/uL (ref 0.0–0.1)
Basophils Relative: 0 % (ref 0–1)
Basophils Relative: 0 % (ref 0–1)
Basophils Relative: 1 % (ref 0–1)
Eosinophils Absolute: 0 10*3/uL (ref 0.0–0.7)
Eosinophils Absolute: 0 10*3/uL (ref 0.0–0.7)
Eosinophils Absolute: 0.1 10*3/uL (ref 0.0–0.7)
Eosinophils Relative: 0 % (ref 0–5)
Eosinophils Relative: 1 % (ref 0–5)
Eosinophils Relative: 1 % (ref 0–5)
Lymphocytes Relative: 14 % (ref 12–46)
Lymphocytes Relative: 17 % (ref 12–46)
Lymphocytes Relative: 23 % (ref 12–46)
Lymphs Abs: 1.1 10*3/uL (ref 0.7–4.0)
Lymphs Abs: 1.5 10*3/uL (ref 0.7–4.0)
Lymphs Abs: 1.8 10*3/uL (ref 0.7–4.0)
Monocytes Absolute: 0.5 10*3/uL (ref 0.1–1.0)
Monocytes Absolute: 0.6 10*3/uL (ref 0.1–1.0)
Monocytes Absolute: 0.7 10*3/uL (ref 0.1–1.0)
Monocytes Relative: 6 % (ref 3–12)
Monocytes Relative: 7 % (ref 3–12)
Monocytes Relative: 8 % (ref 3–12)
Neutro Abs: 5.3 10*3/uL (ref 1.7–7.7)
Neutro Abs: 6.4 10*3/uL (ref 1.7–7.7)
Neutro Abs: 6.8 10*3/uL (ref 1.7–7.7)
Neutrophils Relative %: 68 % (ref 43–77)
Neutrophils Relative %: 75 % (ref 43–77)
Neutrophils Relative %: 80 % — ABNORMAL HIGH (ref 43–77)

## 2010-10-07 LAB — URIC ACID: Uric Acid, Serum: 6.4 mg/dL (ref 2.4–7.0)

## 2010-10-07 LAB — POCT I-STAT, CHEM 8
BUN: 13 mg/dL (ref 6–23)
Calcium, Ion: 1.13 mmol/L (ref 1.12–1.32)
Chloride: 98 mEq/L (ref 96–112)
Creatinine, Ser: 0.9 mg/dL (ref 0.4–1.2)
Glucose, Bld: 111 mg/dL — ABNORMAL HIGH (ref 70–99)
HCT: 30 % — ABNORMAL LOW (ref 36.0–46.0)
Hemoglobin: 10.2 g/dL — ABNORMAL LOW (ref 12.0–15.0)
Potassium: 3.9 mEq/L (ref 3.5–5.1)
Sodium: 138 mEq/L (ref 135–145)
TCO2: 30 mmol/L (ref 0–100)

## 2010-10-07 LAB — URINALYSIS, ROUTINE W REFLEX MICROSCOPIC
Bilirubin Urine: NEGATIVE
Glucose, UA: NEGATIVE mg/dL
Ketones, ur: NEGATIVE mg/dL
Nitrite: NEGATIVE
Protein, ur: NEGATIVE mg/dL
Specific Gravity, Urine: 1.015 (ref 1.005–1.030)
Urobilinogen, UA: 0.2 mg/dL (ref 0.0–1.0)
pH: 6 (ref 5.0–8.0)

## 2010-10-07 LAB — CBC
HCT: 23.7 % — ABNORMAL LOW (ref 36.0–46.0)
HCT: 24.5 % — ABNORMAL LOW (ref 36.0–46.0)
HCT: 24.8 % — ABNORMAL LOW (ref 36.0–46.0)
HCT: 24.9 % — ABNORMAL LOW (ref 36.0–46.0)
HCT: 25.6 % — ABNORMAL LOW (ref 36.0–46.0)
HCT: 27.2 % — ABNORMAL LOW (ref 36.0–46.0)
HCT: 28.8 % — ABNORMAL LOW (ref 36.0–46.0)
HCT: 30.9 % — ABNORMAL LOW (ref 36.0–46.0)
HCT: 31.9 % — ABNORMAL LOW (ref 36.0–46.0)
Hemoglobin: 10.3 g/dL — ABNORMAL LOW (ref 12.0–15.0)
Hemoglobin: 10.5 g/dL — ABNORMAL LOW (ref 12.0–15.0)
Hemoglobin: 7.8 g/dL — ABNORMAL LOW (ref 12.0–15.0)
Hemoglobin: 8.1 g/dL — ABNORMAL LOW (ref 12.0–15.0)
Hemoglobin: 8.2 g/dL — ABNORMAL LOW (ref 12.0–15.0)
Hemoglobin: 8.2 g/dL — ABNORMAL LOW (ref 12.0–15.0)
Hemoglobin: 8.6 g/dL — ABNORMAL LOW (ref 12.0–15.0)
Hemoglobin: 8.9 g/dL — ABNORMAL LOW (ref 12.0–15.0)
Hemoglobin: 9.4 g/dL — ABNORMAL LOW (ref 12.0–15.0)
MCHC: 32.7 g/dL (ref 30.0–36.0)
MCHC: 32.7 g/dL (ref 30.0–36.0)
MCHC: 33 g/dL (ref 30.0–36.0)
MCHC: 33 g/dL (ref 30.0–36.0)
MCHC: 33.1 g/dL (ref 30.0–36.0)
MCHC: 33.1 g/dL (ref 30.0–36.0)
MCHC: 33.2 g/dL (ref 30.0–36.0)
MCHC: 33.3 g/dL (ref 30.0–36.0)
MCHC: 33.5 g/dL (ref 30.0–36.0)
MCV: 92.8 fL (ref 78.0–100.0)
MCV: 93.3 fL (ref 78.0–100.0)
MCV: 93.8 fL (ref 78.0–100.0)
MCV: 94.3 fL (ref 78.0–100.0)
MCV: 94.3 fL (ref 78.0–100.0)
MCV: 94.7 fL (ref 78.0–100.0)
MCV: 94.9 fL (ref 78.0–100.0)
MCV: 95.1 fL (ref 78.0–100.0)
MCV: 95.4 fL (ref 78.0–100.0)
Platelets: 193 10*3/uL (ref 150–400)
Platelets: 193 10*3/uL (ref 150–400)
Platelets: 202 10*3/uL (ref 150–400)
Platelets: 218 10*3/uL (ref 150–400)
Platelets: 235 10*3/uL (ref 150–400)
Platelets: 240 10*3/uL (ref 150–400)
Platelets: 255 10*3/uL (ref 150–400)
Platelets: 285 10*3/uL (ref 150–400)
Platelets: 471 10*3/uL — ABNORMAL HIGH (ref 150–400)
RBC: 2.49 MIL/uL — ABNORMAL LOW (ref 3.87–5.11)
RBC: 2.6 MIL/uL — ABNORMAL LOW (ref 3.87–5.11)
RBC: 2.61 MIL/uL — ABNORMAL LOW (ref 3.87–5.11)
RBC: 2.64 MIL/uL — ABNORMAL LOW (ref 3.87–5.11)
RBC: 2.74 MIL/uL — ABNORMAL LOW (ref 3.87–5.11)
RBC: 2.86 MIL/uL — ABNORMAL LOW (ref 3.87–5.11)
RBC: 3.11 MIL/uL — ABNORMAL LOW (ref 3.87–5.11)
RBC: 3.28 MIL/uL — ABNORMAL LOW (ref 3.87–5.11)
RBC: 3.36 MIL/uL — ABNORMAL LOW (ref 3.87–5.11)
RDW: 13.9 % (ref 11.5–15.5)
RDW: 13.9 % (ref 11.5–15.5)
RDW: 14.1 % (ref 11.5–15.5)
RDW: 14.1 % (ref 11.5–15.5)
RDW: 14.3 % (ref 11.5–15.5)
RDW: 14.4 % (ref 11.5–15.5)
RDW: 14.5 % (ref 11.5–15.5)
RDW: 14.5 % (ref 11.5–15.5)
RDW: 14.6 % (ref 11.5–15.5)
WBC: 5.6 10*3/uL (ref 4.0–10.5)
WBC: 5.9 10*3/uL (ref 4.0–10.5)
WBC: 6.4 10*3/uL (ref 4.0–10.5)
WBC: 6.7 10*3/uL (ref 4.0–10.5)
WBC: 6.9 10*3/uL (ref 4.0–10.5)
WBC: 7.7 10*3/uL (ref 4.0–10.5)
WBC: 8.1 10*3/uL (ref 4.0–10.5)
WBC: 8.6 10*3/uL (ref 4.0–10.5)
WBC: 9 10*3/uL (ref 4.0–10.5)

## 2010-10-07 LAB — APTT: aPTT: 28 seconds (ref 24–37)

## 2010-10-07 LAB — SAMPLE TO BLOOD BANK

## 2010-10-07 LAB — TYPE AND SCREEN
ABO/RH(D): A NEG
Antibody Screen: POSITIVE
DAT, IgG: NEGATIVE

## 2010-10-07 LAB — LIPID PANEL
Cholesterol: 156 mg/dL (ref 0–200)
HDL: 44 mg/dL (ref >39)
LDL Cholesterol: 96 mg/dL (ref 0–99)
Total CHOL/HDL Ratio: 3.5 RATIO
Triglycerides: 81 mg/dL (ref <150)
VLDL: 16 mg/dL (ref 0–40)

## 2010-10-07 LAB — HEMOGLOBIN AND HEMATOCRIT, BLOOD
HCT: 25.7 % — ABNORMAL LOW (ref 36.0–46.0)
HCT: 27.6 % — ABNORMAL LOW (ref 36.0–46.0)
Hemoglobin: 8.6 g/dL — ABNORMAL LOW (ref 12.0–15.0)
Hemoglobin: 9.2 g/dL — ABNORMAL LOW (ref 12.0–15.0)

## 2010-10-07 LAB — PREPARE RBC (CROSSMATCH)

## 2010-10-07 LAB — RETICULOCYTES
RBC.: 2.62 MIL/uL — ABNORMAL LOW (ref 3.87–5.11)
Retic Count, Absolute: 47.2 10*3/uL (ref 19.0–186.0)
Retic Ct Pct: 1.8 % (ref 0.4–3.1)

## 2010-10-07 LAB — VITAMIN B12: Vitamin B-12: 305 pg/mL (ref 211–911)

## 2010-10-07 LAB — MAGNESIUM: Magnesium: 1.9 mg/dL (ref 1.5–2.5)

## 2010-10-07 LAB — IRON AND TIBC
Iron: 31 ug/dL — ABNORMAL LOW (ref 42–135)
Saturation Ratios: 13 % — ABNORMAL LOW (ref 20–55)
TIBC: 243 ug/dL — ABNORMAL LOW (ref 250–470)
UIBC: 212 ug/dL

## 2010-10-07 LAB — HEMOCCULT GUIAC POC 1CARD (OFFICE): Fecal Occult Bld: POSITIVE

## 2010-10-07 LAB — FERRITIN: Ferritin: 72 ng/mL (ref 10–291)

## 2010-10-07 LAB — ABO/RH: ABO/RH(D): A NEG

## 2010-10-07 LAB — HEMOGLOBIN A1C
Hgb A1c MFr Bld: 6 % (ref 4.6–6.1)
Mean Plasma Glucose: 126 mg/dL

## 2010-10-07 LAB — PROTIME-INR
INR: 1.06 (ref 0.00–1.49)
Prothrombin Time: 13.7 seconds (ref 11.6–15.2)

## 2010-10-07 LAB — CLOTEST (H. PYLORI), BIOPSY: Helicobacter screen: NEGATIVE

## 2010-10-07 LAB — FOLATE: Folate: 15.3 ng/mL

## 2010-10-07 LAB — TSH: TSH: 3.732 u[IU]/mL (ref 0.350–4.500)

## 2011-04-15 LAB — I-STAT 8, (EC8 V) (CONVERTED LAB)
Acid-Base Excess: 1
BUN: 14
Bicarbonate: 25.1 — ABNORMAL HIGH
Chloride: 106
Glucose, Bld: 107 — ABNORMAL HIGH
HCT: 39
Hemoglobin: 13.3
Operator id: 257131
Potassium: 3.5
Sodium: 139
TCO2: 26
pCO2, Ven: 38.2 — ABNORMAL LOW
pH, Ven: 7.425 — ABNORMAL HIGH

## 2011-04-15 LAB — DIFFERENTIAL
Basophils Absolute: 0
Basophils Relative: 0
Eosinophils Absolute: 0.1
Eosinophils Relative: 1
Lymphocytes Relative: 19
Lymphs Abs: 1.2
Monocytes Absolute: 0.5
Monocytes Relative: 8
Neutro Abs: 4.4
Neutrophils Relative %: 72

## 2011-04-15 LAB — AMYLASE: Amylase: 81

## 2011-04-15 LAB — CBC
HCT: 35.5 — ABNORMAL LOW
Hemoglobin: 11.8 — ABNORMAL LOW
MCHC: 33.2
MCV: 91.7
Platelets: 290
RBC: 3.88
RDW: 14.2 — ABNORMAL HIGH
WBC: 6.2

## 2011-04-15 LAB — POCT I-STAT CREATININE
Creatinine, Ser: 0.8
Operator id: 257131

## 2013-11-29 ENCOUNTER — Telehealth: Payer: Self-pay | Admitting: Internal Medicine

## 2013-11-29 NOTE — Telephone Encounter (Signed)
Called pt to schedule. LVM, for call back.

## 2013-11-29 NOTE — Telephone Encounter (Signed)
Message copied by Julieanne Manson on Thu Nov 29, 2013  2:41 PM ------      Message from: Corwin Levins      Created: Wed Nov 28, 2013  7:30 PM      Regarding: RE: Can patient Re-establish       Ok with me      ----- Message -----         From: Julieanne Manson         Sent: 11/28/2013   4:08 PM           To: Corwin Levins, MD      Subject: Can patient Re-establish                                 Dr. Jonny Ruiz,            Patient was last seen in 2011 and wants to re-establish. Please advise if okay for her to re-establish?                  Thanks,      Hal Morales.        ------

## 2014-02-08 ENCOUNTER — Emergency Department (INDEPENDENT_AMBULATORY_CARE_PROVIDER_SITE_OTHER)
Admission: EM | Admit: 2014-02-08 | Discharge: 2014-02-08 | Disposition: A | Discharge status: Home / Self Care | Payer: Medicare PPO | Source: Home / Self Care

## 2014-02-08 ENCOUNTER — Encounter (HOSPITAL_COMMUNITY): Payer: Self-pay | Admitting: Emergency Medicine

## 2014-02-08 DIAGNOSIS — T783XXA Angioneurotic edema, initial encounter: Secondary | ICD-10-CM

## 2014-02-08 DIAGNOSIS — R220 Localized swelling, mass and lump, head: Secondary | ICD-10-CM

## 2014-02-08 HISTORY — DX: Essential (primary) hypertension: I10

## 2014-02-08 HISTORY — DX: Unspecified osteoarthritis, unspecified site: M19.90

## 2014-02-08 MED ORDER — DIPHENHYDRAMINE HCL 25 MG PO CAPS
ORAL_CAPSULE | ORAL | Status: AC
  Filled 2014-02-08: qty 1

## 2014-02-08 MED ORDER — DIPHENHYDRAMINE HCL 25 MG PO CAPS
25.0000 mg | ORAL_CAPSULE | Freq: Once | ORAL | Status: AC
  Administered 2014-02-08: 25 mg via ORAL

## 2014-02-08 MED ORDER — METHYLPREDNISOLONE SODIUM SUCC 125 MG IJ SOLR
INTRAMUSCULAR | Status: AC
  Filled 2014-02-08: qty 2

## 2014-02-08 MED ORDER — METHYLPREDNISOLONE SODIUM SUCC 125 MG IJ SOLR
80.0000 mg | Freq: Once | INTRAMUSCULAR | Status: AC
  Administered 2014-02-08: 80 mg via INTRAMUSCULAR

## 2014-02-08 MED ORDER — DIPHENHYDRAMINE HCL 50 MG/ML IJ SOLN
INTRAMUSCULAR | Status: AC
  Filled 2014-02-08: qty 1

## 2014-02-08 MED ORDER — PREDNISONE 20 MG PO TABS: ORAL_TABLET | ORAL | Status: DC

## 2014-02-08 MED ORDER — DIPHENHYDRAMINE HCL 50 MG/ML IJ SOLN: 25.0000 mg | Freq: Once | INTRAMUSCULAR | Status: DC

## 2014-02-08 NOTE — ED Notes (Signed)
R side of tongue swelled up @ 1300 and it is making it hard for her to talk. Denies SOB.  Has swelling on R side of neck.  No lip swelling.

## 2014-02-08 NOTE — ED Provider Notes (Signed)
CSN: [id number]     [id number] date & time 02/08/14  1719 History   First MD Initiated Contact with Patient 02/08/14 1749     Chief Complaint  Patient presents with  . Allergic Reaction   (Consider location/radiation/quality/duration/timing/severity/associated sxs/prior Treatment) HPI Comments: 80 year old female is accompanied by her family with a complaint of swelling of the time and right side of the face. This began shortly after and in today. She states that the tongue swelling seems like it is getting worse and causing her to have problems with speech. She notices swelling to the right lower cheek. He denies any known insect sting or bite. No area of pain. Denies problems breathing or rash. This is the third similar episode and 2 years. Interestingly, she has been taking lisinopril during this period of time.   Past Medical History  Diagnosis Date  . Hypertension   . Arthritis    Past Surgical History  Procedure Laterality Date  . Arm fracture Left 1967    Fx. in 3 places and had a dropped wrist   Family History  Problem Relation Age of Onset  . Heart Problems Mother   . Osteoarthritis Mother   . Heart Problems Father   . Osteoarthritis Father   . Heart Problems Brother   . Osteoarthritis Brother   . Cancer Other    History  Substance Use Topics  . Smoking status: Current Every Day Smoker -- 0.50 packs/day    Types: Cigarettes  . Smokeless tobacco: Not on file  . Alcohol Use: No   OB History   Grav Para Term Preterm Abortions TAB SAB Ect Mult Living                 Review of Systems  Constitutional: Negative for fever, diaphoresis, activity change and fatigue.  HENT: Negative for congestion, ear pain, mouth sores, postnasal drip, rhinorrhea, sinus pressure, sneezing, sore throat and voice change.   Eyes: Negative.   Respiratory: Negative.  Negative for cough, choking, shortness of breath, wheezing and stridor.   Cardiovascular: Negative.  Negative for leg swelling.   Gastrointestinal: Negative.   Skin: Negative for color change and rash.  Neurological: Positive for speech difficulty. Negative for dizziness, tremors, seizures, syncope, light-headedness and headaches.  Psychiatric/Behavioral: Negative for confusion and agitation.    Allergies  Aspirin; Atorvastatin; Indomethacin; Morphine and related; Penicillins; and Sulfa antibiotics  Home Medications   Prior to Admission medications   Medication Sig Start Date End Date Taking? Authorizing Provider  Cholecalciferol (VITAMIN D-3 PO) Take 1,000 Int'l Units by mouth.   Yes Historical Provider, MD  Cyanocobalamin (VITAMIN B 12 PO) Take by mouth.   Yes Historical Provider, MD  HYDROcodone-acetaminophen (NORCO) 7.5-325 MG per tablet Take 1 tablet by mouth every 6 (six) hours as needed for moderate pain.   Yes Historical Provider, MD  lisinopril (PRINIVIL,ZESTRIL) 5 MG tablet Take 5 mg by mouth daily.   Yes Historical Provider, MD  simvastatin (ZOCOR) 20 MG tablet Take 20 mg by mouth daily.   Yes Historical Provider, MD  triamterene-hydrochlorothiazide (MAXZIDE-25) 37.5-25 MG per tablet Take 1 tablet by mouth daily.   Yes Historical Provider, MD  predniSONE (DELTASONE) 20 MG tablet Take 2 tabs for 2 days, then 1 tab for 2 days Take with food. 02/08/14   Hayden Rasmussenavid Mabe, NP   BP 148/71  Pulse 82  Temp(Src) 98.7 F (37.1 C) (Oral)  SpO2 100% Physical Exam  Nursing note and vitals reviewed. Constitutional: She is oriented to person,  place, and time. She appears well-developed and well-nourished. No distress.  HENT:  Oropharynx: There is swelling and erythema to the right lateral tongue beginning at the mid sagittal line. There is no effect to the left lateral aspect of the tongue. There are no lesions to the time, no buccal or dental lesions. No other swelling within the oral cavity. No edema to the lips. The posterior pharynx is without erythema or swelling. The soft palate and uvula are of normal size and  without erythema or swelling. External face shows mild soft swelling within the subcutaneous tissue of the right lower face and chin. No tense swelling or tenderness.  Eyes: Conjunctivae and EOM are normal.  Neck: Normal range of motion. Neck supple.  Cardiovascular: Normal rate, regular rhythm and normal heart sounds.   Pulmonary/Chest: Effort normal and breath sounds normal. No respiratory distress. She has no wheezes. She has no rales.  Musculoskeletal: She exhibits no edema.  Lymphadenopathy:    She has no cervical adenopathy.  Neurological: She is alert and oriented to person, place, and time. She exhibits normal muscle tone.  Skin: Skin is warm and dry. No rash noted. No erythema.    ED Course  Procedures (including critical care time) Labs Review Labs Reviewed - No data to display  Imaging Review No results found.   MDM   1. Angioedema, initial encounter     Pt mentioned that after taking a benadryl for a possible sulfa allergy her tongue began to swell. It is doubtful this was due to the Benadryl but will adm it po instead of IM.  Was discussed in detail the possible adverse effects were continuing symptoms in regards to increased swelling, choking, problems swallowing or breathing or swelling of the lips to go to the emergency department or call 911. She has been observed for over an hour since arrival. She has not had any worsening, and the symptoms or changes. She states she actually feels like the tongue has decreased in swelling. The appearance seems to be about the same as on arrival. Only the right side of the tongue has swelling. Her airway is widely patent. Lungs are clear, respirations are even and nonlabored, she is speaking in complete sentences with intelligible speech. If this is truly an angioedema type reaction to ACE inhibitors the symptoms should be abating over the next several hours when this is discontinued. If it is due to another etiology and the  combination of Benadryl every 4 hours and prednisone should help. She is stable at this time of discharge.    Hayden Rasmussen, NP 02/08/14 1914  Hayden Rasmussen, NP 02/08/14 [phone]

## 2014-02-08 NOTE — ED Notes (Signed)
Pt.'s daughter said she was itching from Sulfa drugs once and they called the doctor.  Was told to take Benadryl 50 mg. And her tongue swelled up.  Discussed with Hayden Rasmussenavid Mabe prior to giving Benadryl 25 mg. IM.  He said he did not think the Benadryl caused the tongue swelling previously.  He said to give it po.

## 2014-02-08 NOTE — ED Notes (Signed)
Pt. States she is feeling better. States it feels like her tongue is smaller and easier to talk.  Voice clearer.  No apprarant reaction to Benadryl.

## 2014-02-08 NOTE — ED Notes (Signed)
Concern for allergic reaction today. Has swollen tongue , able to speak in complete sentences. NAD. Denies new foods, medications, injury . States she has this off and on for past few years, but is worse today

## 2014-02-08 NOTE — Discharge Instructions (Signed)
Angioedema Stop the Lisinopril completely. Call your doctor. For any worsening at all, more swelling, choking, trouble breathing call 911 or go to the ED. Angioedema is a sudden swelling of tissues, often of the skin. It can occur on the face or genitals or in the abdomen or other body parts. The swelling usually develops over a short period and gets better in 24 to 48 hours. It often begins during the night and is found when the person wakes up. The person may also get red, itchy patches of skin (hives). Angioedema can be dangerous if it involves swelling of the air passages.  Depending on the cause, episodes of angioedema may only happen once, come back in unpredictable patterns, or repeat for several years and then gradually fade away.  CAUSES  Angioedema can be caused by an allergic reaction to various triggers. It can also result from nonallergic causes, including reactions to drugs, immune system disorders, viral infections, or an abnormal gene that is passed to you from your parents (hereditary). For some people with angioedema, the cause is unknown.  Some things that can trigger angioedema include:   Foods.   Medicines, such as ACE inhibitors, ARBs, nonsteroidal anti-inflammatory agents, or estrogen.   Latex.   Animal saliva.   Insect stings.   Dyes used in X-rays.   Mild injury.   Dental work.  Surgery.  Stress.   Sudden changes in temperature.   Exercise. SIGNS AND SYMPTOMS   Swelling of the skin.  Hives. If these are present, there is also intense itching.  Redness in the affected area.   Pain in the affected area.  Swollen lips or tongue.  Breathing problems. This may happen if the air passages swell.  Wheezing. If internal organs are involved, there may be:   Nausea.   Abdominal pain.   Vomiting.   Difficulty swallowing.   Difficulty passing urine. DIAGNOSIS   Your health care provider will examine the affected area and take a  medical and family history.  Various tests may be done to help determine the cause. Tests may include:  Allergy skin tests to see if the problem is an allergic reaction.   Blood tests to check for hereditary angioedema.   Tests to check for underlying diseases that could cause the condition.   A review of your medicines, including over-the-counter medicines, may be done. TREATMENT  Treatment will depend on the cause of the angioedema. Possible treatments include:   Removal of anything that triggered the condition (such as stopping certain medicines).   Medicines to treat symptoms or prevent attacks. Medicines given may include:   Antihistamines.   Epinephrine injection.   Steroids.   Hospitalization may be required for severe attacks. If the air passages are affected, it can be an emergency. Tubes may need to be placed to keep the airway open. HOME CARE INSTRUCTIONS   Take all medicines as directed by your health care provider.  If you were given medicines for emergency allergy treatment, always carry them with you.  Wear a medical bracelet as directed by your health care provider.   Avoid known triggers. SEEK MEDICAL CARE IF:   You have repeat attacks of angioedema.   Your attacks are more frequent or more severe despite preventive measures.   You have hereditary angioedema and are considering having children. It is important to discuss with your health care provider the risks of passing the condition on to your children. SEEK IMMEDIATE MEDICAL CARE IF:   You have  severe swelling of the mouth, tongue, or lips.  You have difficulty breathing.   You have difficulty swallowing.   You faint. MAKE SURE YOU:  Understand these instructions.  Will watch your condition.  Will get help right away if you are not doing well or get worse. Document Released: 08/30/2001 Document Revised: 11/05/2013 Document Reviewed: 02/12/2013 [facility] Patient Information  2015 [place], [place]. This information is not intended to replace advice given to you by your health care provider. Make sure you discuss any questions you have with your health care provider.  Drug Allergy Allergic reactions to medicines are common. Some allergic reactions are mild. A delayed type of drug allergy that occurs 1 week or more after exposure to a medicine or vaccine is called serum sickness. A life-threatening, sudden (acute) allergic reaction that involves the whole body is called anaphylaxis. CAUSES  "True" drug allergies occur when there is an allergic reaction to a medicine. This is caused by overactivity of the immune system. First, the body becomes sensitized. The immune system is triggered by your first exposure to the medicine. Following this first exposure, future exposure to the same medicine may be life-threatening. Almost any medicine can cause an allergic reaction. Common ones are:  Penicillin.  Sulfonamides (sulfa drugs).  Local anesthetics.  X-ray dyes that contain iodine. SYMPTOMS  Common symptoms of a minor allergic reaction are:  Swelling around the mouth.  An itchy red rash or hives.  Vomiting or diarrhea. Anaphylaxis can cause swelling of the mouth and throat. This makes it difficult to breathe and swallow. Severe reactions can be fatal within seconds, even after exposure to only a trace amount of the drug that causes the reaction. HOME CARE INSTRUCTIONS   If you are unsure of what caused your reaction, keep a diary of foods and medicines used. Include the symptoms that followed. Avoid anything that causes reactions.  You may want to follow up with an allergy specialist after the reaction has cleared in order to be tested to confirm the allergy. It is important to confirm that your reaction is an allergy, not just a side effect to the medicine. If you have a true allergy to a medicine, this may prevent that medicine and related medicines from being given  to you when you are very ill.  If you have hives or a rash:  Take medicines as directed by your caregiver.  You may use an over-the-counter antihistamine (diphenhydramine) as needed.  Apply cold compresses to the skin or take baths in cool water. Avoid hot baths or showers.  If you are severely allergic:  Continuous observation after a severe reaction may be needed. Hospitalization is often required.  Wear a medical alert bracelet or necklace stating your allergy.  You and your family must learn how to use an anaphylaxis kit or give an epinephrine injection to temporarily treat an emergency allergic reaction. If you have had a severe reaction, always carry your epinephrine injection or anaphylaxis kit with you. This can be lifesaving if you have a severe reaction.  Do not drive or perform tasks after treatment until the medicines used to treat your reaction have worn off, or until your caregiver says it is okay. SEEK MEDICAL CARE IF:   You think you had an allergic reaction. Symptoms usually start within 30 minutes after exposure.  Symptoms are getting worse rather than better.  You develop new symptoms.  The symptoms that brought you to your caregiver return. SEEK IMMEDIATE MEDICAL CARE IF:  You have swelling of the mouth, difficulty breathing, or wheezing.  You have a tight feeling in your chest or throat.  You develop hives, swelling, or itching all over your body.  You develop severe vomiting or diarrhea.  You feel faint or pass out. This is an emergency. Use your epinephrine injection or anaphylaxis kit as you have been instructed. Call for emergency medical help. Even if you improve after the injection, you need to be examined at a hospital emergency department. MAKE SURE YOU:   Understand these instructions.  Will watch your condition.  Will get help right away if you are not doing well or get worse. Document Released: 06/21/2005 Document Revised: 09/13/2011  Document Reviewed: 11/25/2010 [facility] Patient Information 2015 [place], [place]. This information is not intended to replace advice given to you by your health care provider. Make sure you discuss any questions you have with your health care provider.

## 2014-02-10 NOTE — ED Provider Notes (Signed)
Medical screening examination/treatment/procedure(s) were performed by non-physician practitioner and as supervising physician I was immediately available for consultation/collaboration.  Leslee Homeavid Keller, M.D.  Reuben Likesavid C Keller, MD 02/10/14 [phone]

## 2015-03-07 ENCOUNTER — Telehealth: Payer: Self-pay | Admitting: Geriatric Medicine

## 2015-03-07 NOTE — Telephone Encounter (Signed)
Pt daughter called in and wanted to know if you would take Jeyla back on as a pt?     Best number [phone]  [name]

## 2015-03-07 NOTE — Telephone Encounter (Signed)
Ok with me 

## 2015-03-12 NOTE — Telephone Encounter (Signed)
Spoke with patient.  There is an appointment already scheduled for patient but daughter can not get off work to take patient.  Patient states daughter is going to call back to reschedule.

## 2015-04-02 ENCOUNTER — Ambulatory Visit: Payer: Medicare PPO | Admitting: Internal Medicine

## 2015-04-23 ENCOUNTER — Encounter: Payer: Self-pay | Admitting: Internal Medicine

## 2015-04-23 ENCOUNTER — Ambulatory Visit (INDEPENDENT_AMBULATORY_CARE_PROVIDER_SITE_OTHER): Payer: Medicare Other | Admitting: Internal Medicine

## 2015-04-23 ENCOUNTER — Other Ambulatory Visit (INDEPENDENT_AMBULATORY_CARE_PROVIDER_SITE_OTHER): Payer: Medicare Other

## 2015-04-23 VITALS — BP 128/70 | HR 96 | Ht 62.75 in | Wt 167.0 lb

## 2015-04-23 DIAGNOSIS — Z0189 Encounter for other specified special examinations: Secondary | ICD-10-CM

## 2015-04-23 DIAGNOSIS — Z23 Encounter for immunization: Secondary | ICD-10-CM | POA: Diagnosis not present

## 2015-04-23 DIAGNOSIS — Z0001 Encounter for general adult medical examination with abnormal findings: Secondary | ICD-10-CM | POA: Insufficient documentation

## 2015-04-23 DIAGNOSIS — G8929 Other chronic pain: Secondary | ICD-10-CM

## 2015-04-23 DIAGNOSIS — I10 Essential (primary) hypertension: Secondary | ICD-10-CM

## 2015-04-23 DIAGNOSIS — M21371 Foot drop, right foot: Secondary | ICD-10-CM

## 2015-04-23 DIAGNOSIS — M545 Low back pain, unspecified: Secondary | ICD-10-CM

## 2015-04-23 DIAGNOSIS — M79674 Pain in right toe(s): Secondary | ICD-10-CM | POA: Diagnosis not present

## 2015-04-23 DIAGNOSIS — E785 Hyperlipidemia, unspecified: Secondary | ICD-10-CM | POA: Diagnosis not present

## 2015-04-23 DIAGNOSIS — M79675 Pain in left toe(s): Secondary | ICD-10-CM

## 2015-04-23 DIAGNOSIS — Z0000 Encounter for general adult medical examination without abnormal findings: Secondary | ICD-10-CM

## 2015-04-23 DIAGNOSIS — M21372 Foot drop, left foot: Secondary | ICD-10-CM | POA: Insufficient documentation

## 2015-04-23 HISTORY — DX: Other chronic pain: G89.29

## 2015-04-23 HISTORY — DX: Low back pain, unspecified: M54.50

## 2015-04-23 HISTORY — DX: Foot drop, right foot: M21.371

## 2015-04-23 LAB — URINALYSIS, ROUTINE W REFLEX MICROSCOPIC
Bilirubin Urine: NEGATIVE
Nitrite: NEGATIVE
Specific Gravity, Urine: 1.025 (ref 1.000–1.030)
Total Protein, Urine: NEGATIVE
Urine Glucose: NEGATIVE
Urobilinogen, UA: 0.2 (ref 0.0–1.0)
pH: 5.5 (ref 5.0–8.0)

## 2015-04-23 LAB — CBC WITH DIFFERENTIAL/PLATELET
Basophils Absolute: 0 10*3/uL (ref 0.0–0.1)
Basophils Relative: 0.4 % (ref 0.0–3.0)
Eosinophils Absolute: 0 10*3/uL (ref 0.0–0.7)
Eosinophils Relative: 0.4 % (ref 0.0–5.0)
HCT: 38.5 % (ref 36.0–46.0)
Hemoglobin: 12.5 g/dL (ref 12.0–15.0)
Lymphocytes Relative: 17.4 % (ref 12.0–46.0)
Lymphs Abs: 1.7 10*3/uL (ref 0.7–4.0)
MCHC: 32.6 g/dL (ref 30.0–36.0)
MCV: 92.4 fl (ref 78.0–100.0)
Monocytes Absolute: 0.8 10*3/uL (ref 0.1–1.0)
Monocytes Relative: 7.8 % (ref 3.0–12.0)
Neutro Abs: 7.2 10*3/uL (ref 1.4–7.7)
Neutrophils Relative %: 74 % (ref 43.0–77.0)
Platelets: 272 10*3/uL (ref 150.0–400.0)
RBC: 4.16 Mil/uL (ref 3.87–5.11)
RDW: 14.4 % (ref 11.5–15.5)
WBC: 9.7 10*3/uL (ref 4.0–10.5)

## 2015-04-23 MED ORDER — TRAMADOL HCL 50 MG PO TABS: 50.0000 mg | ORAL_TABLET | Freq: Four times a day (QID) | ORAL | Status: DC | PRN

## 2015-04-23 MED ORDER — AMLODIPINE BESYLATE 5 MG PO TABS: 5.0000 mg | ORAL_TABLET | Freq: Every day | ORAL | Status: DC

## 2015-04-23 NOTE — Addendum Note (Signed)
Addended by: Corwin LevinsJOHN, JAMES W on: 04/23/2015 05:23 PM   Modules accepted: Orders

## 2015-04-23 NOTE — Assessment & Plan Note (Signed)
Has been on hydrocodone for 5 yrs per daughter but did ok with recent tramadol, will cont tramadol if can control well enough,  to f/u any worsening symptoms or concerns

## 2015-04-23 NOTE — Assessment & Plan Note (Signed)

## 2015-04-23 NOTE — Assessment & Plan Note (Signed)
stable overall by history and exam, recent data reviewed with pt, and pt to continue medical treatment as before,  to f/u any worsening symptoms or concerns BP Readings from Last 3 Encounters:  04/23/15 128/70  02/08/14 148/71  09/23/09 110/60

## 2015-04-23 NOTE — Progress Notes (Signed)
Pre visit review using our clinic review tool, if applicable. No additional management support is needed unless otherwise documented below in the visit note. 

## 2015-04-23 NOTE — Progress Notes (Addendum)
Subjective:    Patient ID: Sydney Lowery, female    DOB: 1924/01/27, 81 y.o.   MRN: [id number]  HPI    Here for wellness and to re-establish;  Overall doing ok;  Pt denies Chest pain, worsening SOB, DOE, wheezing, orthopnea, PND, worsening LE edema, palpitations, dizziness or syncope.  Pt denies neurological change such as new headache, facial or extremity weakness.  Pt denies polydipsia, polyuria, or low sugar symptoms. Pt states overall good compliance with treatment and medications, good tolerability, and has been trying to follow appropriate diet.  Pt denies worsening depressive symptoms, suicidal ideation or panic. No fever, night sweats, wt loss, loss of appetite, or other constitutional symptoms.  Pt states good ability with ADL's, has low fall risk, home safety reviewed and adequate, no other significant changes in hearing or vision, and only occasionally active with exercise.  Pt continues to have recurring LBP without change in severity, bowel or bladder change, fever, wt loss,  worsening LE pain/numbness/weakness, gait change or falls.   Walks with walker, has Home H with aide (and nurse every 3 mo);  Has mult toe pain/long nails - needs to see podiatry.  Does have several wks ongoing nasal allergy symptoms with clearish congestion, itch and sneezing, without fever, pain, ST, cough, swelling or wheezing, does not want new meds as she is wary of any side effect. Will accept tramadol pain however.  Has to have appts later in the dya due to hours it takes to be mobile. Does not think PT at this time.   Past Medical History  Diagnosis Date  . Hypertension   . Arthritis   . Bilateral foot-drop 04/23/2015   Past Surgical History  Procedure Laterality Date  . Arm fracture Left 1967    Fx. in 3 places and had a dropped wrist    reports that she has been smoking Cigarettes.  She has been smoking about 0.50 packs per day. She does not have any smokeless tobacco history on file. She reports that she  does not drink alcohol or use illicit drugs. family history includes Cancer in her other; Heart Problems in her brother, father, and mother; Osteoarthritis in her brother, father, and mother. Allergies  Allergen Reactions  . Aspirin Other (See Comments)    Has diverticulitis, caused bleeding.  . Advil [Ibuprofen] Swelling    "tongue swelling"  . Atorvastatin   . Indomethacin     REACTION: gi upset  . Morphine And Related Swelling    Swelling in mouth  . Penicillins   . Sulfa Antibiotics Itching   Current Outpatient Prescriptions on File Prior to Visit  Medication Sig Dispense Refill  . Cholecalciferol (VITAMIN D-3 PO) Take 1,000 Int'l Units by mouth.    . Cyanocobalamin (VITAMIN B 12 PO) Take by mouth.    . simvastatin (ZOCOR) 20 MG tablet Take 20 mg by mouth daily.    Marland Kitchen. triamterene-hydrochlorothiazide (MAXZIDE-25) 37.5-25 MG per tablet Take 0.5 tablets by mouth daily.      No current facility-administered medications on file prior to visit.   Review of Systems Constitutional: Negative for increased diaphoresis, other activity, appetite or siginficant weight change other than noted HENT: Negative for worsening hearing loss, ear pain, facial swelling, mouth sores and neck stiffness.   Eyes: Negative for other worsening pain, redness or visual disturbance.  Respiratory: Negative for shortness of breath and wheezing  Cardiovascular: Negative for chest pain and palpitations.  Gastrointestinal: Negative for diarrhea, blood in stool, abdominal distention or  other pain Genitourinary: Negative for hematuria, flank pain or change in urine volume.  Musculoskeletal: Negative for myalgias or other joint complaints.  Skin: Negative for color change and wound or drainage.  Neurological: Negative for syncope and numbness. other than noted Hematological: Negative for adenopathy. or other swelling Psychiatric/Behavioral: Negative for hallucinations, SI, self-injury, decreased concentration or  other worsening agitation.      Objective:   Physical Exam BP 128/70 mmHg  Pulse 96  Ht 5' 2.75" (1.594 m)  Wt 167 lb (75.751 kg)  BMI 29.81 kg/m2  SpO2 95% VS noted,  Constitutional: Pt is oriented to person, place, and time. Appears well-developed and well-nourished, in no significant distress Head: Normocephalic and atraumatic.  Right Ear: External ear normal.  Left Ear: External ear normal.  Nose: Nose normal.  Mouth/Throat: Oropharynx is clear and moist.  Eyes: Conjunctivae and EOM are normal. Pupils are equal, round, and reactive to light.  Neck: Normal range of motion. Neck supple. No JVD present. No tracheal deviation present or significant neck LA or mass Cardiovascular: Normal rate, regular rhythm, normal heart sounds and intact distal pulses.   Pulmonary/Chest: Effort normal and breath sounds without rales or wheezing  Abdominal: Soft. Bowel sounds are normal. NT. No HSM  Musculoskeletal: Normal range of motion. Exhibits no edema.  Lymphadenopathy:  Has no cervical adenopathy.  Neurological: Pt is alert and oriented to person, place, and time. Pt has normal reflexes. No cranial nerve deficit. Motor grossly intact Skin: Skin is warm and dry. No rash noted.  Psychiatric:  Has normal mood and affect. Behavior is normal.  Spine mild scoliosis with left lower back prominence, ? Lipoma as well     Assessment & Plan:

## 2015-04-23 NOTE — Patient Instructions (Addendum)
You had the flu shot today  Please return in 2 wks (or after such as Nov 9)  for the Prevnar 13 shot   Please take all new medication as prescribed - the tramadol  Please continue all other medications as before, and refills have been done if requested.  Please have the pharmacy call with any other refills you may need.  Please continue your efforts at being more active, low cholesterol diet, and weight control.  You will be contacted regarding the referral for: podiatry  You are otherwise up to date with prevention measures today.  Please keep your appointments with your specialists as you may have planned  Please go to the LAB in the Basement (turn left off the elevator) for the tests to be done today  You will be contacted by phone if any changes need to be made immediately.  Otherwise, you will receive a letter about your results with an explanation, but please check with MyChart first.  Please remember to sign up for MyChart if you have not done so, as this will be important to you in the future with finding out test results, communicating by private email, and scheduling acute appointments online when needed.  Please return in 3 months, or sooner if needed

## 2015-04-24 ENCOUNTER — Encounter: Payer: Self-pay | Admitting: Internal Medicine

## 2015-04-24 LAB — BASIC METABOLIC PANEL
BUN: 24 mg/dL — ABNORMAL HIGH (ref 6–23)
CO2: 31 mEq/L (ref 19–32)
Calcium: 9.9 mg/dL (ref 8.4–10.5)
Chloride: 102 mEq/L (ref 96–112)
Creatinine, Ser: 1.07 mg/dL (ref 0.40–1.20)
GFR: 62.12 mL/min (ref >60.00)
Glucose, Bld: 98 mg/dL (ref 70–99)
Potassium: 3.7 mEq/L (ref 3.5–5.1)
Sodium: 142 mEq/L (ref 135–145)

## 2015-04-24 LAB — HEPATIC FUNCTION PANEL
ALT: 15 U/L (ref 0–35)
AST: 19 U/L (ref 0–37)
Albumin: 4 g/dL (ref 3.5–5.2)
Alkaline Phosphatase: 77 U/L (ref 39–117)
Bilirubin, Direct: -0.2 mg/dL — ABNORMAL LOW (ref 0.0–0.3)
Total Bilirubin: 0.3 mg/dL (ref 0.2–1.2)
Total Protein: 7.5 g/dL (ref 6.0–8.3)

## 2015-04-24 LAB — TSH: TSH: 2.11 u[IU]/mL (ref 0.35–4.50)

## 2015-04-24 LAB — LIPID PANEL
Cholesterol: 166 mg/dL (ref 0–200)
HDL: 56.6 mg/dL (ref >39.00)
LDL Cholesterol: 84 mg/dL (ref 0–99)
NonHDL: 108.93
Total CHOL/HDL Ratio: 3
Triglycerides: 126 mg/dL (ref 0.0–149.0)
VLDL: 25.2 mg/dL (ref 0.0–40.0)

## 2015-04-24 LAB — VITAMIN D 25 HYDROXY (VIT D DEFICIENCY, FRACTURES): VITD: 22.33 ng/mL — ABNORMAL LOW (ref 30.00–100.00)

## 2015-04-25 ENCOUNTER — Telehealth: Payer: Self-pay | Admitting: Internal Medicine

## 2015-04-25 MED ORDER — NITROFURANTOIN MONOHYD MACRO 100 MG PO CAPS: 100.0000 mg | ORAL_CAPSULE | Freq: Two times a day (BID) | ORAL | Status: DC

## 2015-04-25 NOTE — Telephone Encounter (Signed)
Please advise,, thanks

## 2015-04-25 NOTE — Telephone Encounter (Signed)
This was done to [facility] for prob UTI

## 2015-04-25 NOTE — Telephone Encounter (Signed)
Informed daughter

## 2015-04-25 NOTE — Telephone Encounter (Signed)
Needs antibiotic sent as soon as possible to [facility] at [place].  Needs a vm left on her phone once sent.

## 2015-05-16 ENCOUNTER — Telehealth: Payer: Self-pay | Admitting: *Deleted

## 2015-05-16 DIAGNOSIS — N3000 Acute cystitis without hematuria: Secondary | ICD-10-CM

## 2015-05-16 MED ORDER — TRIAMTERENE-HCTZ 37.5-25 MG PO TABS: 0.5000 | ORAL_TABLET | Freq: Every day | ORAL | Status: DC

## 2015-05-16 MED ORDER — AMLODIPINE BESYLATE 5 MG PO TABS: 5.0000 mg | ORAL_TABLET | Freq: Every day | ORAL | Status: DC

## 2015-05-16 MED ORDER — SIMVASTATIN 20 MG PO TABS: 20.0000 mg | ORAL_TABLET | Freq: Every day | ORAL | Status: DC

## 2015-05-16 NOTE — Telephone Encounter (Signed)
Receive call daughter states md wanted mom to come in to have pneumonia shot, and since she completed the antibiotic mom started back c/o of abd cramps. Want to also have urine recheck to see if infection has gone. Made nurse visit for next Wednesday per daughter request. Inform her she can also go down to have UA recheck whn she come in. Daughter states md sent refills to [facility] on her BP med, but they use [facility].Marland Kitchen.Raechel Chute/lmb

## 2015-05-21 ENCOUNTER — Telehealth: Payer: Self-pay | Admitting: Internal Medicine

## 2015-05-21 ENCOUNTER — Ambulatory Visit: Payer: Medicare Other

## 2015-05-21 NOTE — Telephone Encounter (Signed)
Patient Name: Sydney Lowery  DOB: 1924-03-03    Initial Comment Caller states her mother was suppose to be seen today. Pt has edema in the right hand and right leg. States her right hand is very painful and red.    Nurse Assessment  Nurse: Annye Englisharmon, RN, Denise Date/Time (Eastern Time): 05/21/2015 2:52:40 PM  Confirm and document reason for call. If symptomatic, describe symptoms. ---Caller states her mother was suppose to be seen today by the nurse for a Pneumonia injection and some lab work. Pt has edema in the right hand and right knee and thigh. States her right hand is very painful and red. She is having a large amt of difficulty with ambulation. No fever. Symptoms started on Sunday.  Has the patient traveled out of the country within the last 30 days? ---Not Applicable  Does the patient have any new or worsening symptoms? ---Yes  Will a triage be completed? ---Yes  Related visit to physician within the last 2 weeks? ---No  Does the PT have any chronic conditions? (i.e. diabetes, asthma, etc.) ---Yes  List chronic conditions. ---HTN, Arthritis  Is this a behavioral health call? ---No     Guidelines    Guideline Title Affirmed Question Affirmed Notes  Hand and Wrist Pain [1] SEVERE pain (e.g., excruciating, unable to use hand at all) AND [2] not improved after 2 hours of pain medicine    Final Disposition User   See Physician within 4 Hours (or PCP triage) Carmon, RN, Angelique Blonderenise    Comments  Caller states the pt can not hold on to her walker with her right hand due to swelling/pain. States she will call for ambulance to take the pt to the ER, but the pt refused to be evaluated in ER or MDO. States she is going to wait to see if the hand gets better over the next few days so that she can use her walker for ambulation. Reinforced need to be evaluated today within the next 4 hours. Advised she could potentially have Cellulitis and this needs to be treated asap. Verb understanding.   Referrals  GO  TO FACILITY REFUSED   Disagree/Comply: Disagree  Disagree/Comply Reason: Wait and

## 2015-07-30 ENCOUNTER — Ambulatory Visit: Payer: Medicare Other | Admitting: Internal Medicine

## 2015-08-06 ENCOUNTER — Ambulatory Visit: Payer: Medicare Other | Admitting: Internal Medicine

## 2015-08-20 ENCOUNTER — Encounter: Payer: Self-pay | Admitting: Internal Medicine

## 2015-08-20 ENCOUNTER — Other Ambulatory Visit: Payer: Self-pay | Admitting: Internal Medicine

## 2015-08-20 ENCOUNTER — Ambulatory Visit (INDEPENDENT_AMBULATORY_CARE_PROVIDER_SITE_OTHER): Payer: Medicare Other | Admitting: Internal Medicine

## 2015-08-20 ENCOUNTER — Other Ambulatory Visit (INDEPENDENT_AMBULATORY_CARE_PROVIDER_SITE_OTHER): Payer: Medicare Other

## 2015-08-20 VITALS — BP 122/74 | HR 87 | Temp 98.3°F | Resp 20 | Wt 164.0 lb

## 2015-08-20 DIAGNOSIS — J309 Allergic rhinitis, unspecified: Secondary | ICD-10-CM | POA: Diagnosis not present

## 2015-08-20 DIAGNOSIS — Z0000 Encounter for general adult medical examination without abnormal findings: Secondary | ICD-10-CM

## 2015-08-20 DIAGNOSIS — R109 Unspecified abdominal pain: Secondary | ICD-10-CM | POA: Insufficient documentation

## 2015-08-20 DIAGNOSIS — I10 Essential (primary) hypertension: Secondary | ICD-10-CM | POA: Diagnosis not present

## 2015-08-20 DIAGNOSIS — R1030 Lower abdominal pain, unspecified: Secondary | ICD-10-CM | POA: Diagnosis not present

## 2015-08-20 DIAGNOSIS — Z23 Encounter for immunization: Secondary | ICD-10-CM | POA: Diagnosis not present

## 2015-08-20 DIAGNOSIS — Z0189 Encounter for other specified special examinations: Secondary | ICD-10-CM | POA: Diagnosis not present

## 2015-08-20 LAB — URINALYSIS, ROUTINE W REFLEX MICROSCOPIC
Bilirubin Urine: NEGATIVE
Ketones, ur: NEGATIVE
Nitrite: NEGATIVE
Specific Gravity, Urine: 1.02 (ref 1.000–1.030)
Urine Glucose: NEGATIVE
Urobilinogen, UA: 0.2 (ref 0.0–1.0)
pH: 5.5 (ref 5.0–8.0)

## 2015-08-20 LAB — LIPID PANEL
Cholesterol: 164 mg/dL (ref 0–200)
HDL: 52.6 mg/dL (ref >39.00)
LDL Cholesterol: 97 mg/dL (ref 0–99)
NonHDL: 111.09
Total CHOL/HDL Ratio: 3
Triglycerides: 72 mg/dL (ref 0.0–149.0)
VLDL: 14.4 mg/dL (ref 0.0–40.0)

## 2015-08-20 LAB — CBC WITH DIFFERENTIAL/PLATELET
Basophils Absolute: 0 10*3/uL (ref 0.0–0.1)
Basophils Relative: 0.3 % (ref 0.0–3.0)
Eosinophils Absolute: 0 10*3/uL (ref 0.0–0.7)
Eosinophils Relative: 0.5 % (ref 0.0–5.0)
HCT: 38.3 % (ref 36.0–46.0)
Hemoglobin: 12.7 g/dL (ref 12.0–15.0)
Lymphocytes Relative: 20 % (ref 12.0–46.0)
Lymphs Abs: 1.7 10*3/uL (ref 0.7–4.0)
MCHC: 33.1 g/dL (ref 30.0–36.0)
MCV: 90 fl (ref 78.0–100.0)
Monocytes Absolute: 0.7 10*3/uL (ref 0.1–1.0)
Monocytes Relative: 8.1 % (ref 3.0–12.0)
Neutro Abs: 6.1 10*3/uL (ref 1.4–7.7)
Neutrophils Relative %: 71.1 % (ref 43.0–77.0)
Platelets: 304 10*3/uL (ref 150.0–400.0)
RBC: 4.25 Mil/uL (ref 3.87–5.11)
RDW: 15 % (ref 11.5–15.5)
WBC: 8.6 10*3/uL (ref 4.0–10.5)

## 2015-08-20 LAB — BASIC METABOLIC PANEL
BUN: 20 mg/dL (ref 6–23)
CO2: 30 mEq/L (ref 19–32)
Calcium: 9.8 mg/dL (ref 8.4–10.5)
Chloride: 102 mEq/L (ref 96–112)
Creatinine, Ser: 0.82 mg/dL (ref 0.40–1.20)
GFR: 84.39 mL/min (ref >60.00)
Glucose, Bld: 94 mg/dL (ref 70–99)
Potassium: 3.7 mEq/L (ref 3.5–5.1)
Sodium: 141 mEq/L (ref 135–145)

## 2015-08-20 LAB — HEPATIC FUNCTION PANEL
ALT: 12 U/L (ref 0–35)
AST: 17 U/L (ref 0–37)
Albumin: 4.2 g/dL (ref 3.5–5.2)
Alkaline Phosphatase: 86 U/L (ref 39–117)
Bilirubin, Direct: 0.1 mg/dL (ref 0.0–0.3)
Total Bilirubin: 0.4 mg/dL (ref 0.2–1.2)
Total Protein: 7.7 g/dL (ref 6.0–8.3)

## 2015-08-20 LAB — TSH: TSH: 3.48 u[IU]/mL (ref 0.35–4.50)

## 2015-08-20 MED ORDER — CIPROFLOXACIN HCL 250 MG PO TABS: 250.0000 mg | ORAL_TABLET | Freq: Two times a day (BID) | ORAL | Status: DC

## 2015-08-20 NOTE — Progress Notes (Signed)
Pre visit review using our clinic review tool, if applicable. No additional management support is needed unless otherwise documented below in the visit note. 

## 2015-08-20 NOTE — Assessment & Plan Note (Signed)
stable overall by history and exam, recent data reviewed with pt, and pt to continue medical treatment as before,  to f/u any worsening symptoms or concerns BP Readings from Last 3 Encounters:  08/20/15 122/74  04/23/15 128/70  02/08/14 148/71

## 2015-08-20 NOTE — Assessment & Plan Note (Signed)
Exam benign, for urine studies r/o recurrent uti

## 2015-08-20 NOTE — Progress Notes (Signed)
Subjective:    Patient ID: Sydney Lowery, female    DOB: Jun 21, 1924, 82 y.o.   MRN: [id number]  HPI  Here for wellness and f/u;  Overall doing ok;  Pt denies Chest pain, worsening SOB, DOE, wheezing, orthopnea, PND, worsening LE edema, palpitations, dizziness or syncope.  Pt denies neurological change such as new headache, facial or extremity weakness.  Pt denies polydipsia, polyuria, or low sugar symptoms. Pt states overall good compliance with treatment and medications, good tolerability, and has been trying to follow appropriate diet.  Pt denies worsening depressive symptoms, suicidal ideation or panic. No fever, night sweats, wt loss, loss of appetite, or other constitutional symptoms.  Pt states good ability with ADL's, has low fall risk, home safety reviewed and adequate, no other significant changes in hearing or vision, and only occasionally active with exercise.  Does have several wks ongoing nasal allergy symptoms with clearish congestion, itch and sneezing, without fever, pain, ST, cough, swelling or wheezing.  Denies worsening reflux, dysphagia, n/v, bowel change or blood, but occas lower mid abd pain, s/p antibx last visit for UTI. Denies urinary symptoms such as dysuria, frequency, urgency, flank pain, hematuria or n/v, fever, chills. Past Medical History  Diagnosis Date  . Hypertension   . Arthritis   . Bilateral foot-drop 04/23/2015   Past Surgical History  Procedure Laterality Date  . Arm fracture Left 1967    Fx. in 3 places and had a dropped wrist    reports that she has been smoking Cigarettes.  She has been smoking about 0.50 packs per day. She does not have any smokeless tobacco history on file. She reports that she does not drink alcohol or use illicit drugs. family history includes Cancer in her other; Heart Problems in her brother, father, and mother; Osteoarthritis in her brother, father, and mother. Allergies  Allergen Reactions  . Aspirin Other (See Comments)   Has diverticulitis, caused bleeding.  . Advil [Ibuprofen] Swelling    "tongue swelling"  . Atorvastatin   . Indomethacin     REACTION: gi upset  . Morphine And Related Swelling    Swelling in mouth  . Penicillins   . Sulfa Antibiotics Itching   Current Outpatient Prescriptions on File Prior to Visit  Medication Sig Dispense Refill  . amLODipine (NORVASC) 5 MG tablet Take 1 tablet (5 mg total) by mouth daily. 90 tablet 3  . Cholecalciferol (VITAMIN D-3 PO) Take 1,000 Int'l Units by mouth.    . Cyanocobalamin (VITAMIN B 12 PO) Take by mouth.    . simvastatin (ZOCOR) 20 MG tablet Take 1 tablet (20 mg total) by mouth daily. 90 tablet 3  . traMADol (ULTRAM) 50 MG tablet Take 1 tablet (50 mg total) by mouth every 6 (six) hours as needed. 120 tablet 5  . triamterene-hydrochlorothiazide (MAXZIDE-25) 37.5-25 MG tablet Take 0.5 tablets by mouth daily. 45 tablet 3   No current facility-administered medications on file prior to visit.     Review of Systems Constitutional: Negative for increased diaphoresis, other activity, appetite or siginficant weight change other than noted HENT: Negative for worsening hearing loss, ear pain, facial swelling, mouth sores and neck stiffness.   Eyes: Negative for other worsening pain, redness or visual disturbance.  Respiratory: Negative for shortness of breath and wheezing  Cardiovascular: Negative for chest pain and palpitations.  Gastrointestinal: Negative for diarrhea, blood in stool, abdominal distention or other pain Genitourinary: Negative for hematuria, flank pain or change in urine volume.  Musculoskeletal: Negative  for myalgias or other joint complaints.  Skin: Negative for color change and wound or drainage.  Neurological: Negative for syncope and numbness. other than noted Hematological: Negative for adenopathy. or other swelling Psychiatric/Behavioral: Negative for hallucinations, SI, self-injury, decreased concentration or other worsening  agitation.      Objective:   Physical Exam BP 122/74 mmHg  Pulse 87  Temp(Src) 98.3 F (36.8 C) (Oral)  Resp 20  Wt 164 lb (74.39 kg)  SpO2 97% VS noted, not ill appearing Constitutional: Pt is oriented to person, place, and time. Appears well-developed and well-nourished, in no significant distress Head: Normocephalic and atraumatic.  Right Ear: External ear normal.  Left Ear: External ear normal.  Nose: Nose normal.  Mouth/Throat: Oropharynx is clear and moist.  Eyes: Conjunctivae and EOM are normal. Pupils are equal, round, and reactive to light.  Neck: Normal range of motion. Neck supple. No JVD present. No tracheal deviation present or significant neck LA or mass Cardiovascular: Normal rate, regular rhythm, normal heart sounds and intact distal pulses.   Pulmonary/Chest: Effort normal and breath sounds without rales or wheezing  Abdominal: Soft. Bowel sounds are normal. NT. No HSM  Musculoskeletal: Normal range of motion. Exhibits no edema.  Lymphadenopathy:  Has no cervical adenopathy.  Neurological: Pt is alert and oriented to person, place, and time. Pt has normal reflexes. No cranial nerve deficit. Motor grossly intact Skin: Skin is warm and dry. No rash noted.  Psychiatric:  Has normal mood and affect. Behavior is normal.     Assessment & Plan:

## 2015-08-20 NOTE — Assessment & Plan Note (Signed)
Mild, for otc claritin 10 qd prn,  to f/u any worsening symptoms or concerns

## 2015-08-20 NOTE — Patient Instructions (Addendum)
You had the new Prevnar 13 pneumonia shot  Please continue all other medications as before, and refills have been updated (none needed today)  Please have the pharmacy call with any other refills you may need.  Please continue your efforts at being more active, low cholesterol diet, and weight control.  You are otherwise up to date with prevention measures today.  Please keep your appointments with your specialists as you may have planned  Please go to the LAB in the Basement (turn left off the elevator) for the tests to be done today  You will be contacted by phone if any changes need to be made immediately.  Otherwise, you will receive a letter about your results with an explanation, but please check with MyChart first.  Please remember to sign up for MyChart if you have not done so, as this will be important to you in the future with finding out test results, communicating by private email, and scheduling acute appointments online when needed.  Please return in 6 months, or sooner if needed

## 2015-08-20 NOTE — Assessment & Plan Note (Signed)

## 2015-08-20 NOTE — Addendum Note (Signed)
Addended by: Etheleen Mayhew C on: 08/20/2015 04:15 PM   Modules accepted: Orders

## 2015-08-21 ENCOUNTER — Other Ambulatory Visit: Payer: Self-pay

## 2015-08-21 ENCOUNTER — Telehealth: Payer: Self-pay

## 2015-08-21 MED ORDER — CIPROFLOXACIN HCL 250 MG PO TABS: 250.0000 mg | ORAL_TABLET | Freq: Two times a day (BID) | ORAL | Status: DC

## 2015-08-21 NOTE — Telephone Encounter (Signed)
Medication was sent to correct pharmacy.

## 2015-08-22 LAB — URINE CULTURE: Colony Count: 50000

## 2015-10-29 ENCOUNTER — Telehealth: Payer: Self-pay | Admitting: Internal Medicine

## 2015-10-29 MED ORDER — TRAMADOL HCL 50 MG PO TABS: 50.0000 mg | ORAL_TABLET | Freq: Four times a day (QID) | ORAL | Status: DC | PRN

## 2015-10-29 NOTE — Telephone Encounter (Signed)
[facility] [place]  States patient can't take anything over the counter because her tung swells up.  States patient is out of tramadol.  [facility]  Please follow up in regards.

## 2015-10-29 NOTE — Telephone Encounter (Signed)
Please advise 

## 2015-10-29 NOTE — Telephone Encounter (Signed)
Done hardcopy to Corinne  

## 2015-10-30 NOTE — Telephone Encounter (Signed)
Medication faxed to pharmacy 

## 2016-02-26 ENCOUNTER — Other Ambulatory Visit: Payer: Self-pay | Admitting: Internal Medicine

## 2016-03-04 ENCOUNTER — Ambulatory Visit (INDEPENDENT_AMBULATORY_CARE_PROVIDER_SITE_OTHER): Payer: Medicare Other | Admitting: Internal Medicine

## 2016-03-04 ENCOUNTER — Encounter: Payer: Self-pay | Admitting: Internal Medicine

## 2016-03-04 VITALS — BP 138/78 | HR 102 | Temp 98.2°F | Resp 20 | Wt 165.0 lb

## 2016-03-04 DIAGNOSIS — E785 Hyperlipidemia, unspecified: Secondary | ICD-10-CM | POA: Diagnosis not present

## 2016-03-04 DIAGNOSIS — Z23 Encounter for immunization: Secondary | ICD-10-CM

## 2016-03-04 DIAGNOSIS — Z0001 Encounter for general adult medical examination with abnormal findings: Secondary | ICD-10-CM

## 2016-03-04 DIAGNOSIS — I10 Essential (primary) hypertension: Secondary | ICD-10-CM

## 2016-03-04 DIAGNOSIS — G8929 Other chronic pain: Secondary | ICD-10-CM

## 2016-03-04 DIAGNOSIS — R6889 Other general symptoms and signs: Secondary | ICD-10-CM

## 2016-03-04 DIAGNOSIS — M109 Gout, unspecified: Secondary | ICD-10-CM | POA: Diagnosis not present

## 2016-03-04 DIAGNOSIS — M545 Low back pain: Secondary | ICD-10-CM

## 2016-03-04 MED ORDER — TRAMADOL HCL 50 MG PO TABS: 50.0000 mg | ORAL_TABLET | Freq: Four times a day (QID) | ORAL | 2 refills | Status: DC | PRN

## 2016-03-04 MED ORDER — SIMVASTATIN 20 MG PO TABS: 20.0000 mg | ORAL_TABLET | Freq: Every day | ORAL | 3 refills | Status: DC

## 2016-03-04 MED ORDER — TRIAMTERENE-HCTZ 37.5-25 MG PO TABS: 0.5000 | ORAL_TABLET | Freq: Every day | ORAL | 3 refills | Status: DC

## 2016-03-04 MED ORDER — COLCHICINE 0.6 MG PO TABS
0.6000 mg | ORAL_TABLET | Freq: Every day | ORAL | 3 refills | Status: DC
Start: 2016-03-04 — End: 2016-09-15

## 2016-03-04 MED ORDER — AMLODIPINE BESYLATE 5 MG PO TABS: 5.0000 mg | ORAL_TABLET | Freq: Every day | ORAL | 3 refills | Status: DC

## 2016-03-04 NOTE — Assessment & Plan Note (Signed)
Stable, for pain med refill,  to f/u any worsening symptoms or concerns  

## 2016-03-04 NOTE — Progress Notes (Signed)
Pre visit review using our clinic review tool, if applicable. No additional management support is needed unless otherwise documented below in the visit note. 

## 2016-03-04 NOTE — Assessment & Plan Note (Signed)
With freqeunt recurrence, ok for daily colchicine   qd,  to f/u any worsening symptoms or concerns

## 2016-03-04 NOTE — Assessment & Plan Note (Signed)
stable overall by history and exam, recent data reviewed with pt, and pt to continue medical treatment as before,  to f/u any worsening symptoms or concerns BP Readings from Last 3 Encounters:  03/04/16 138/78  08/20/15 122/74  04/23/15 128/70

## 2016-03-04 NOTE — Progress Notes (Signed)
Subjective:    Patient ID: Sydney Lowery, female    DOB: 1923/09/24, 82 y.o.   MRN: [id number]  HPI Here to f/u; overall doing ok,  Pt denies chest pain, increasing sob or doe, wheezing, orthopnea, PND, increased LE swelling, palpitations, dizziness or syncope.  Pt denies new neurological symptoms such as new headache, or facial or extremity weakness or numbness.  Pt denies polydipsia, polyuria, or low sugar episode.   Pt denies new neurological symptoms such as new headache, or facial or extremity weakness or numbness.   Pt states overall good compliance with meds, mostly trying to follow appropriate diet, with wt overall stable,  but little exercise however. Wt Readings from Last 3 Encounters:  03/04/16 165 lb (74.8 kg)  08/20/15 164 lb (74.4 kg)  04/23/15 167 lb (75.8 kg)   Still walks fairly well with walker, no falls.   Still cooks the sun meal for family every week.  Stamina is some less lately.  Does lay about and sleeps often, c/o Does c/o ongoing fatigue, but denies signficant daytime hypersomnolence otherwise.  Gets to sleep at 1am. But sleeps to 11 am most days.  Does have several wks ongoing nasal allergy symptoms with clearish congestion, itch and sneezing, without fever, pain, ST, cough, swelling or wheezing.  C/o recurrent gout to right hand and right foot quite often, with swelling, pain, redness lasting up to 2 wks each time, several times so far this year  Pt continues to have recurring LBP without change in severity, bowel or bladder change, fever, wt loss,  worsening LE pain/numbness/weakness, gait change or falls. Past Medical History:  Diagnosis Date  . Arthritis   . Bilateral foot-drop 04/23/2015  . Hypertension    Past Surgical History:  Procedure Laterality Date  . arm fracture Left 1967   Fx. in 3 places and had a dropped wrist    reports that she has been smoking Cigarettes.  She has been smoking about 0.50 packs per day. She does not have any smokeless tobacco  history on file. She reports that she does not drink alcohol or use drugs. family history includes Cancer in her other; Heart Problems in her brother, father, and mother; Osteoarthritis in her brother, father, and mother. Allergies  Allergen Reactions  . Aspirin Other (See Comments)    Has diverticulitis, caused bleeding.  . Advil [Ibuprofen] Swelling    "tongue swelling"  . Atorvastatin   . Indomethacin     REACTION: gi upset  . Morphine And Related Swelling    Swelling in mouth  . Penicillins   . Sulfa Antibiotics Itching   Current Outpatient Prescriptions on File Prior to Visit  Medication Sig Dispense Refill  . Cholecalciferol (VITAMIN D-3 PO) Take 1,000 Int'l Units by mouth.     No current facility-administered medications on file prior to visit.     Review of Systems  Constitutional: Negative for unusual diaphoresis or night sweats HENT: Negative for ear swelling or discharge Eyes: Negative for worsening visual haziness  Respiratory: Negative for choking and stridor.   Gastrointestinal: Negative for distension or worsening eructation Genitourinary: Negative for retention or change in urine volume.  Musculoskeletal: Negative for other MSK pain or swelling Skin: Negative for color change and worsening wound Neurological: Negative for tremors and numbness other than noted  Psychiatric/Behavioral: Negative for decreased concentration or agitation other than above       Objective:   Physical Exam BP 138/78   Pulse (!) 102   Temp  98.2 F (36.8 C) (Oral)   Resp 20   Wt 165 lb (74.8 kg)   SpO2 95%   BMI 29.46 kg/m  VS noted,  Constitutional: Pt appears in no apparent distress HENT: Head: NCAT.  Right Ear: External ear normal.  Left Ear: External ear normal.  Eyes: . Pupils are equal, round, and reactive to light. Conjunctivae and EOM are normal Neck: Normal range of motion. Neck supple.  Cardiovascular: Normal rate and regular rhythm.   Pulmonary/Chest: Effort  normal and breath sounds without rales or wheezing.  Abd:  Soft, NT, ND, + BS Spine: nontender, + scoliosis/kyphosis mid lower thoracic Neurological: Pt is alert. Not confused , motor grossly intact Skin: Skin is warm. No rash, no LE edema Psychiatric: Pt behavior is normal. No agitation.   Lab Results  Component Value Date   WBC 8.6 08/20/2015   HGB 12.7 08/20/2015   HCT 38.3 08/20/2015   PLT 304.0 08/20/2015   GLUCOSE 94 08/20/2015   CHOL 164 08/20/2015   TRIG 72.0 08/20/2015   HDL 52.60 08/20/2015   LDLDIRECT 191.0 03/26/2008   LDLCALC 97 08/20/2015   ALT 12 08/20/2015   AST 17 08/20/2015   NA 141 08/20/2015   K 3.7 08/20/2015   CL 102 08/20/2015   CREATININE 0.82 08/20/2015   BUN 20 08/20/2015   CO2 30 08/20/2015   TSH 3.48 08/20/2015   INR 1.06 05/20/2009   HGBA1C  05/20/2009    6.0 (NOTE) The ADA recommends the following therapeutic goal for glycemic control related to Hgb A1c measurement: Goal of therapy: <6.5 Hgb A1c  Reference: American Diabetes Association: Clinical Practice Recommendations 2010, Diabetes Care, 2010, 33: (Suppl  1).         Assessment & Plan:

## 2016-03-04 NOTE — Assessment & Plan Note (Signed)
stable overall by history and exam, recent data reviewed with pt, and pt to continue medical treatment as before,  to f/u any worsening symptoms or concerns Lab Results  Component Value Date   LDLCALC 97 08/20/2015

## 2016-03-04 NOTE — Patient Instructions (Addendum)
Please take all new medication as prescribed - the colchicine (one per day)  Please continue all other medications as before, and refills have been done if requested - the pain medication and all the others  Please have the pharmacy call with any other refills you may need.  Please continue your efforts at being more active, low cholesterol diet, and weight control.  Please keep your appointments with your specialists as you may have planned  No blood work is felt needed today  Please return in 6 months, or sooner if needed, with Lab testing done 3-5 days before

## 2016-08-02 ENCOUNTER — Other Ambulatory Visit: Payer: Self-pay | Admitting: Internal Medicine

## 2016-08-02 NOTE — Telephone Encounter (Signed)
Routing to dr john, please advise, thanks 

## 2016-08-03 NOTE — Telephone Encounter (Signed)
faxed

## 2016-08-03 NOTE — Telephone Encounter (Signed)
Done hardcopy to Corinne  

## 2016-09-01 ENCOUNTER — Encounter: Payer: Medicare Other | Admitting: Internal Medicine

## 2016-09-15 ENCOUNTER — Other Ambulatory Visit (INDEPENDENT_AMBULATORY_CARE_PROVIDER_SITE_OTHER): Payer: Medicare Other

## 2016-09-15 ENCOUNTER — Ambulatory Visit (INDEPENDENT_AMBULATORY_CARE_PROVIDER_SITE_OTHER): Payer: Medicare Other | Admitting: Internal Medicine

## 2016-09-15 ENCOUNTER — Encounter: Payer: Self-pay | Admitting: Internal Medicine

## 2016-09-15 VITALS — BP 118/82 | HR 87 | Temp 97.6°F | Wt 164.0 lb

## 2016-09-15 DIAGNOSIS — H918X2 Other specified hearing loss, left ear: Secondary | ICD-10-CM | POA: Diagnosis not present

## 2016-09-15 DIAGNOSIS — R350 Frequency of micturition: Secondary | ICD-10-CM

## 2016-09-15 DIAGNOSIS — G8929 Other chronic pain: Secondary | ICD-10-CM

## 2016-09-15 DIAGNOSIS — H9192 Unspecified hearing loss, left ear: Secondary | ICD-10-CM | POA: Insufficient documentation

## 2016-09-15 DIAGNOSIS — Z0001 Encounter for general adult medical examination with abnormal findings: Secondary | ICD-10-CM

## 2016-09-15 DIAGNOSIS — M25561 Pain in right knee: Secondary | ICD-10-CM | POA: Insufficient documentation

## 2016-09-15 DIAGNOSIS — E785 Hyperlipidemia, unspecified: Secondary | ICD-10-CM | POA: Diagnosis not present

## 2016-09-15 DIAGNOSIS — I10 Essential (primary) hypertension: Secondary | ICD-10-CM | POA: Diagnosis not present

## 2016-09-15 LAB — URINALYSIS, ROUTINE W REFLEX MICROSCOPIC
Bilirubin Urine: NEGATIVE
Ketones, ur: NEGATIVE
Nitrite: NEGATIVE
Specific Gravity, Urine: 1.02 (ref 1.000–1.030)
Total Protein, Urine: NEGATIVE
Urine Glucose: NEGATIVE
Urobilinogen, UA: 0.2 (ref 0.0–1.0)
pH: 5.5 (ref 5.0–8.0)

## 2016-09-15 LAB — CBC WITH DIFFERENTIAL/PLATELET
Basophils Absolute: 0.1 10*3/uL (ref 0.0–0.1)
Basophils Relative: 0.8 % (ref 0.0–3.0)
Eosinophils Absolute: 0 10*3/uL (ref 0.0–0.7)
Eosinophils Relative: 0.7 % (ref 0.0–5.0)
HCT: 37.2 % (ref 36.0–46.0)
Hemoglobin: 12.2 g/dL (ref 12.0–15.0)
Lymphocytes Relative: 20.4 % (ref 12.0–46.0)
Lymphs Abs: 1.4 10*3/uL (ref 0.7–4.0)
MCHC: 32.7 g/dL (ref 30.0–36.0)
MCV: 89.7 fl (ref 78.0–100.0)
Monocytes Absolute: 0.7 10*3/uL (ref 0.1–1.0)
Monocytes Relative: 9.3 % (ref 3.0–12.0)
Neutro Abs: 4.8 10*3/uL (ref 1.4–7.7)
Neutrophils Relative %: 68.8 % (ref 43.0–77.0)
Platelets: 281 10*3/uL (ref 150.0–400.0)
RBC: 4.15 Mil/uL (ref 3.87–5.11)
RDW: 16.3 % — ABNORMAL HIGH (ref 11.5–15.5)
WBC: 7 10*3/uL (ref 4.0–10.5)

## 2016-09-15 LAB — LIPID PANEL
Cholesterol: 167 mg/dL (ref 0–200)
HDL: 52.7 mg/dL (ref >39.00)
LDL Cholesterol: 94 mg/dL (ref 0–99)
NonHDL: 114.19
Total CHOL/HDL Ratio: 3
Triglycerides: 102 mg/dL (ref 0.0–149.0)
VLDL: 20.4 mg/dL (ref 0.0–40.0)

## 2016-09-15 LAB — TSH: TSH: 2.86 u[IU]/mL (ref 0.35–4.50)

## 2016-09-15 LAB — BASIC METABOLIC PANEL
BUN: 17 mg/dL (ref 6–23)
CO2: 31 mEq/L (ref 19–32)
Calcium: 9.8 mg/dL (ref 8.4–10.5)
Chloride: 103 mEq/L (ref 96–112)
Creatinine, Ser: 0.9 mg/dL (ref 0.40–1.20)
GFR: 75.61 mL/min (ref >60.00)
Glucose, Bld: 90 mg/dL (ref 70–99)
Potassium: 3.6 mEq/L (ref 3.5–5.1)
Sodium: 141 mEq/L (ref 135–145)

## 2016-09-15 LAB — HEPATIC FUNCTION PANEL
ALT: 11 U/L (ref 0–35)
AST: 16 U/L (ref 0–37)
Albumin: 3.9 g/dL (ref 3.5–5.2)
Alkaline Phosphatase: 79 U/L (ref 39–117)
Bilirubin, Direct: 0.1 mg/dL (ref 0.0–0.3)
Total Bilirubin: 0.3 mg/dL (ref 0.2–1.2)
Total Protein: 7.3 g/dL (ref 6.0–8.3)

## 2016-09-15 MED ORDER — COLCHICINE 0.6 MG PO TABS: 0.6000 mg | ORAL_TABLET | Freq: Every day | ORAL | 3 refills | Status: DC

## 2016-09-15 MED ORDER — AMLODIPINE BESYLATE 5 MG PO TABS: 5.0000 mg | ORAL_TABLET | Freq: Every day | ORAL | 3 refills | Status: DC

## 2016-09-15 MED ORDER — SIMVASTATIN 20 MG PO TABS: 20.0000 mg | ORAL_TABLET | Freq: Every day | ORAL | 3 refills | Status: DC

## 2016-09-15 MED ORDER — TRAMADOL HCL 50 MG PO TABS: 50.0000 mg | ORAL_TABLET | Freq: Four times a day (QID) | ORAL | 2 refills | Status: DC | PRN

## 2016-09-15 MED ORDER — TRIAMTERENE-HCTZ 37.5-25 MG PO TABS: 0.5000 | ORAL_TABLET | Freq: Every day | ORAL | 3 refills | Status: DC

## 2016-09-15 NOTE — Progress Notes (Signed)
Subjective:    Patient ID: Sydney Lowery, female    DOB: [birth year], 83 y.o.   MRN: [id number]  HPI  Here for wellness and f/u;  Overall doing ok;  Pt denies Chest pain, worsening SOB, DOE, wheezing, orthopnea, PND, worsening LE edema, palpitations, dizziness or syncope.  Pt denies neurological change such as new headache, facial or extremity weakness.  Pt denies polydipsia, polyuria, or low sugar symptoms. Pt states overall good compliance with treatment and medications, good tolerability, and has been trying to follow appropriate diet.  Pt denies worsening depressive symptoms, suicidal ideation or panic. No fever, night sweats, wt loss, loss of appetite, or other constitutional symptoms.  Pt states good ability with ADL's, has low fall risk, home safety reviewed and adequate, no other significant changes in hearing or vision, and only occasionally active with exercise. No other changes to hx.   Except has left hearing loss worse in the past wk, without pain, HA, sinus symptoms  - ? Wax impactions.  Also c/o right knee pain and swelling recurrent in the past month, with worsening in the last 2 days without fever, trauma, gout , and no giveaways or falls  Has also had 3 days mild increased urinary freq but Denies urinary symptoms such as dysuria, frequency, urgency, flank pain, hematuria or n/v, fever, chills. Past Medical History:  Diagnosis Date  . Arthritis   . Bilateral foot-drop 04/23/2015  . Hypertension    Past Surgical History:  Procedure Laterality Date  . arm fracture Left 1967   Fx. in 3 places and had a dropped wrist    reports that she has been smoking Cigarettes.  She has been smoking about 0.50 packs per day. She uses smokeless tobacco. She reports that she does not drink alcohol or use drugs. family history includes Cancer in her other; Heart Problems in her brother, father, and mother; Osteoarthritis in her brother, father, and mother. Allergies  Allergen Reactions  . Aspirin  Other (See Comments)    Has diverticulitis, caused bleeding.  . Advil [Ibuprofen] Swelling    "tongue swelling"  . Atorvastatin   . Indomethacin     REACTION: gi upset  . Morphine And Related Swelling    Swelling in mouth  . Penicillins   . Sulfa Antibiotics Itching   Current Outpatient Prescriptions on File Prior to Visit  Medication Sig Dispense Refill  . Cholecalciferol (VITAMIN D-3 PO) Take 1,000 Int'l Units by mouth.     No current facility-administered medications on file prior to visit.     Review of Systems Constitutional: Negative for increased diaphoresis, or other activity, appetite or siginficant weight change other than noted HENT: Negative for worsening hearing loss, ear pain, facial swelling, mouth sores and neck stiffness.   Eyes: Negative for other worsening pain, redness or visual disturbance.  Respiratory: Negative for choking or stridor Cardiovascular: Negative for other chest pain and palpitations.  Gastrointestinal: Negative for worsening diarrhea, blood in stool, or abdominal distention Genitourinary: Negative for hematuria, flank pain or change in urine volume.  Musculoskeletal: Negative for myalgias or other joint complaints.  Skin: Negative for other color change and wound or drainage.  Neurological: Negative for syncope and numbness. other than noted Hematological: Negative for adenopathy. or other swelling Psychiatric/Behavioral: Negative for hallucinations, SI, self-injury, decreased concentration or other worsening agitation.  All other system neg per pt    Objective:   Physical Exam BP 118/82   Pulse 87   Temp 97.6 F (36.4 C)  Wt 164 lb (74.4 kg)   SpO2 99%   BMI 29.28 kg/m  VS noted,  Constitutional: Pt is oriented to person, place, and time. Appears well-developed and well-nourished, in no significant distress Head: Normocephalic and atraumatic  Eyes: Conjunctivae and EOM are normal. Pupils are equal, round, and reactive to  light Right Ear: External ear normal.  Left Ear: External ear normal Nose: Nose normal.  Bilat canals clear after wax impactions irrigated Mouth/Throat: Oropharynx is clear and moist  Neck: Normal range of motion. Neck supple. No JVD present. No tracheal deviation present or significant neck LA or mass Cardiovascular: Normal rate, regular rhythm, normal heart sounds and intact distal pulses.   Pulmonary/Chest: Effort normal and breath sounds without rales or wheezing  Abdominal: Soft. Bowel sounds are normal. NT. No HSM  Musculoskeletal: Normal range of motion. Exhibits no edema except for right knee with 2+ effusion, reduced ROM, non tender, lockmans neg Lymphadenopathy: Has no cervical adenopathy.  Neurological: Pt is alert and oriented to person, place, and time. Pt has normal reflexes. No cranial nerve deficit. Motor grossly intact Skin: Skin is warm and dry. No rash noted or new ulcers Psychiatric:  Has normal mood and affect. Behavior is normal.  No other exam findings    Assessment & Plan:

## 2016-09-15 NOTE — Patient Instructions (Signed)
You had the left ear irrigated of wax today  Please continue all other medications as before, and refills have been done for all medications to your pharmacy, except the pain medication given in hardcopy  Please have the pharmacy call with any other refills you may need.  Please continue your efforts at being more active, low cholesterol diet, and weight control.  You are otherwise up to date with prevention measures today.  Please keep your appointments with your specialists as you may have planned  You will be contacted regarding the referral for: Dr Katrinka BlazingSmith - sports medicine in this office  Please go to the LAB in the Basement (turn left off the elevator) for the tests to be done today  You will be contacted by phone if any changes need to be made immediately.  Otherwise, you will receive a letter about your results with an explanation, but please check with MyChart first.  Please remember to sign up for MyChart if you have not done so, as this will be important to you in the future with finding out test results, communicating by private email, and scheduling acute appointments online when needed.  Please return in 6 months, or sooner if needed

## 2016-09-16 LAB — URINE CULTURE

## 2016-09-19 NOTE — Assessment & Plan Note (Signed)
stable overall by history and exam, recent data reviewed with pt, and pt to continue medical treatment as before,  to f/u any worsening symptoms or concerns Lab Results  Component Value Date   LDLCALC 94 09/15/2016

## 2016-09-19 NOTE — Assessment & Plan Note (Signed)

## 2016-09-19 NOTE — Assessment & Plan Note (Signed)
Exam benign, etiolgoy unclear, consider OAB, for UA and cx, further tx pending cx results, to f/u any worsening symptoms or concerns

## 2016-09-19 NOTE — Assessment & Plan Note (Signed)
stable overall by history and exam, recent data reviewed with pt, and pt to continue medical treatment as before,  to f/u any worsening symptoms or concerns le BP Readings from Last 3 Encounters:  09/15/16 118/82  03/04/16 138/78  08/20/15 122/74

## 2016-09-19 NOTE — Assessment & Plan Note (Signed)
worseinng in the past wk, resolved with bilat canal irrigations

## 2016-09-19 NOTE — Assessment & Plan Note (Addendum)
c/w marked DJD flare most likely, for pain control, and refer Dr Smith/sport medicine , likely needs cortisone shot  In addition to the time spent performing CPE, I spent an additional 25 minutes face to face,in which greater than 50% of this time was spent in counseling and coordination of care for patient's acute illness as documented.

## 2016-10-14 ENCOUNTER — Telehealth: Payer: Self-pay | Admitting: Internal Medicine

## 2016-10-14 DIAGNOSIS — G8929 Other chronic pain: Secondary | ICD-10-CM

## 2016-10-14 NOTE — Telephone Encounter (Signed)
I do not treat chronic pain with narcotic such as hydrocodone or oxycodone, but I can try to refer to Pain Management if she likes

## 2016-10-14 NOTE — Telephone Encounter (Signed)
The pts daughter called stating that the pt is in a lot of pain. She said that she has never seen her in that much pain before due to arthritis. She was not able to hardly get out of the house or even walk down one step. She said that she needs something else for pain. The tramadol that was prescribed is not helping her. She said that they went to the orthopedic and all they said they could was try to make her comfortable. She wants to know if there is anything that we can prescribe for her. Please advise.

## 2016-10-15 NOTE — Telephone Encounter (Signed)
Ok, referral is done 

## 2016-10-15 NOTE — Telephone Encounter (Signed)
Called Natalie, left VM.

## 2016-10-15 NOTE — Telephone Encounter (Signed)
Pt daugter would like a call back and would like to know what pain management can do. Also would like to go ahead and get the referral.

## 2016-10-15 NOTE — Telephone Encounter (Signed)
Spoke with daughter, [name], I explained to her what pain management could offer. She would like to proceed with a referral.

## 2016-11-22 ENCOUNTER — Other Ambulatory Visit: Payer: Self-pay | Admitting: Internal Medicine

## 2017-01-19 ENCOUNTER — Ambulatory Visit (INDEPENDENT_AMBULATORY_CARE_PROVIDER_SITE_OTHER): Payer: Medicare Other | Admitting: Internal Medicine

## 2017-01-19 ENCOUNTER — Encounter: Payer: Self-pay | Admitting: Internal Medicine

## 2017-01-19 ENCOUNTER — Other Ambulatory Visit (INDEPENDENT_AMBULATORY_CARE_PROVIDER_SITE_OTHER): Payer: Medicare Other

## 2017-01-19 VITALS — BP 144/84 | HR 86 | Ht 62.75 in

## 2017-01-19 DIAGNOSIS — E785 Hyperlipidemia, unspecified: Secondary | ICD-10-CM | POA: Diagnosis not present

## 2017-01-19 DIAGNOSIS — N390 Urinary tract infection, site not specified: Secondary | ICD-10-CM | POA: Diagnosis not present

## 2017-01-19 DIAGNOSIS — F29 Unspecified psychosis not due to a substance or known physiological condition: Secondary | ICD-10-CM | POA: Diagnosis not present

## 2017-01-19 DIAGNOSIS — I10 Essential (primary) hypertension: Secondary | ICD-10-CM | POA: Diagnosis not present

## 2017-01-19 MED ORDER — QUETIAPINE FUMARATE 25 MG PO TABS: 25.0000 mg | ORAL_TABLET | Freq: Two times a day (BID) | ORAL | 5 refills | Status: DC

## 2017-01-19 NOTE — Patient Instructions (Signed)
Your specimen results are pending  You will be contacted by phone if any changes need to be made immediately.  Otherwise, you will receive a letter about your results with an explanation, but please check with MyChart first.  Please take all new medication as prescribed - the seroquel medication  Please continue all other medications as before, and refills have been done if requested.  Please have the pharmacy call with any other refills you may need.  Please keep your appointments with your specialists as you may have planned  Please go to the LAB in the Basement (turn left off the elevator) for the tests to be done at your convenience  Please remember to sign up for MyChart if you have not done so, as this will be important to you in the future with finding out test results, communicating by private email, and scheduling acute appointments online when needed.

## 2017-01-20 ENCOUNTER — Telehealth: Payer: Self-pay

## 2017-01-20 ENCOUNTER — Encounter: Payer: Self-pay | Admitting: Internal Medicine

## 2017-01-20 ENCOUNTER — Other Ambulatory Visit: Payer: Self-pay | Admitting: Internal Medicine

## 2017-01-20 LAB — URINALYSIS, ROUTINE W REFLEX MICROSCOPIC
Bilirubin Urine: NEGATIVE
Hgb urine dipstick: NEGATIVE
Ketones, ur: NEGATIVE
Nitrite: NEGATIVE
Specific Gravity, Urine: 1.025 (ref 1.000–1.030)
Urine Glucose: NEGATIVE
Urobilinogen, UA: 0.2 (ref 0.0–1.0)
pH: 5.5 (ref 5.0–8.0)

## 2017-01-20 LAB — URINE CULTURE: Organism ID, Bacteria: NO GROWTH

## 2017-01-20 MED ORDER — LEVOFLOXACIN 250 MG PO TABS: 250.0000 mg | ORAL_TABLET | Freq: Every day | ORAL | 0 refills | Status: AC

## 2017-01-20 NOTE — Telephone Encounter (Signed)
Daughter informed of test results.

## 2017-01-20 NOTE — Telephone Encounter (Signed)
-----   Message from Corwin LevinsJames W John, MD sent at 01/20/2017 11:20 AM EDT ----- Left message on MyChart, pt to cont same tx except  The test results show that your current treatment is OK, except the urine test seems to be possibly consistent with infection.  We will send and antibiotic to the pharmacy and you should be notified from the office as well.    Shirron to please inform pt, I will do rx

## 2017-01-20 NOTE — Telephone Encounter (Signed)
Called pt's daughter, LVM.  ° °

## 2017-01-21 DIAGNOSIS — F29 Unspecified psychosis not due to a substance or known physiological condition: Secondary | ICD-10-CM | POA: Insufficient documentation

## 2017-01-21 HISTORY — DX: Unspecified psychosis not due to a substance or known physiological condition: F29

## 2017-01-21 NOTE — Assessment & Plan Note (Signed)
Mild persistent for several months, does not bother pt and no other behavioral issues, for low dose seroquel 25 bid but not clear she will accept this as daughter not sure if really needed, and really only wanted urine testing today to r/o UTI

## 2017-01-21 NOTE — Progress Notes (Signed)
Subjective:    Patient ID: Sydney Lowery, female    DOB: 1924/06/02, 83 y.o.   MRN: [id number]  HPI  Here with daughter who provides the hx; pt received visit per soc services worker who noted pt symptoms of auditory and visual hallucinations, which have been ongoing for several months and do not bother her.  Soc worker suggested to daughter that maybe a UTI is present.  Pt has some HOH and dementia, but Denies urinary symptoms such as dysuria, frequency, urgency, flank pain, hematuria or n/v, fever, chills.  Denies worsening depressive symptoms, suicidal ideation, or panic; and not assoc with behavioral changes such as paranoia, or agitation. Past Medical History:  Diagnosis Date  . Arthritis   . Bilateral foot-drop 04/23/2015  . Hypertension    Past Surgical History:  Procedure Laterality Date  . arm fracture Left 1967   Fx. in 3 places and had a dropped wrist    reports that she has been smoking Cigarettes.  She has been smoking about 0.50 packs per day. She uses smokeless tobacco. She reports that she does not drink alcohol or use drugs. family history includes Cancer in her other; Heart Problems in her brother, father, and mother; Osteoarthritis in her brother, father, and mother. Allergies  Allergen Reactions  . Aspirin Other (See Comments)    Has diverticulitis, caused bleeding.  . Advil [Ibuprofen] Swelling    "tongue swelling"  . Atorvastatin   . Indomethacin     REACTION: gi upset  . Morphine And Related Swelling    Swelling in mouth  . Penicillins   . Sulfa Antibiotics Itching   Current Outpatient Prescriptions on File Prior to Visit  Medication Sig Dispense Refill  . amLODipine (NORVASC) 5 MG tablet Take 1 tablet (5 mg total) by mouth daily. 90 tablet 3  . Cholecalciferol (VITAMIN D-3 PO) Take 1,000 Int'l Units by mouth.    . colchicine 0.6 MG tablet Take 1 tablet (0.6 mg total) by mouth daily. 90 tablet 3  . simvastatin (ZOCOR) 20 MG tablet Take 1 tablet (20 mg  total) by mouth daily. 90 tablet 3  . traMADol (ULTRAM) 50 MG tablet Take 1 tablet (50 mg total) by mouth every 6 (six) hours as needed. 120 tablet 2  . triamterene-hydrochlorothiazide (MAXZIDE-25) 37.5-25 MG tablet Take 0.5 tablets by mouth daily. 45 tablet 3   No current facility-administered medications on file prior to visit.    Review of Systems  Constitutional: Negative for other unusual diaphoresis or sweats HENT: Negative for ear discharge or swelling Eyes: Negative for other worsening visual disturbances Respiratory: Negative for stridor or other swelling  Gastrointestinal: Negative for worsening distension or other blood Genitourinary: Negative for retention or other urinary change Musculoskeletal: Negative for other MSK pain or swelling Skin: Negative for color change or other new lesions Neurological: Negative for worsening tremors and other numbness  Psychiatric/Behavioral: Negative for worsening agitation or other fatigue All other system neg per pt    Objective:   Physical Exam BP (!) 144/84   Pulse 86   Ht 5' 2.75" (1.594 m)   SpO2 97%  VS noted,  Constitutional: Pt appears in NAD HENT: Head: NCAT.  Right Ear: External ear normal.  Left Ear: External ear normal.  Eyes: . Pupils are equal, round, and reactive to light. Conjunctivae and EOM are normal Nose: without d/c or deformity Neck: Neck supple. Gross normal ROM Cardiovascular: Normal rate and regular rhythm.   Pulmonary/Chest: Effort normal and breath sounds  without rales or wheezing.  Abd:  Soft, NT, ND, + BS, no organomegaly, no flank tender Neurological: Pt is alert. At baseline orientation - to person only, motor grossly intact Skin: Skin is warm. No rashes, other new lesions, no LE edema Psychiatric: Pt behavior is normal without agitation , not depressed affect No other exam findings    Assessment & Plan:

## 2017-01-21 NOTE — Assessment & Plan Note (Signed)
stable overall by history and exam, recent data reviewed with pt, and pt to continue medical treatment as before,  to f/u any worsening symptoms or concerns BP Readings from Last 3 Encounters:  01/19/17 (!) 144/84  09/15/16 118/82  03/04/16 138/78

## 2017-01-21 NOTE — Assessment & Plan Note (Signed)
stable overall by history and exam, recent data reviewed with pt, and pt to continue medical treatment as before,  to f/u any worsening symptoms or concerns Lab Results  Component Value Date   LDLCALC 94 09/15/2016

## 2017-01-31 ENCOUNTER — Telehealth: Payer: Self-pay | Admitting: *Deleted

## 2017-01-31 MED ORDER — QUETIAPINE FUMARATE 25 MG PO TABS
25.0000 mg | ORAL_TABLET | Freq: Two times a day (BID) | ORAL | 5 refills | Status: AC
Start: 2017-01-31 — End: ?

## 2017-01-31 NOTE — Telephone Encounter (Signed)
Daughter left msg on triage stating mom saw MD and was suppose to send rx for Seroquel, but the pharmacy never received script. Per chart pt printed rx on 7/18 not sure if rx was faxed or not. Called daughter back no answer [facility]...Raechel Chute/lmb

## 2017-02-01 ENCOUNTER — Other Ambulatory Visit: Payer: Self-pay | Admitting: Internal Medicine

## 2017-02-01 ENCOUNTER — Telehealth: Payer: Self-pay | Admitting: Internal Medicine

## 2017-02-01 DIAGNOSIS — R300 Dysuria: Secondary | ICD-10-CM

## 2017-02-01 NOTE — Telephone Encounter (Signed)
Called pt's daughter, no answer so I left a VM wanting her to call me at her earliest convenience in the morning.

## 2017-02-01 NOTE — Telephone Encounter (Signed)
As it turns out, the urine culture was negative 13 days ago, so "theortically" there may not have been a infection to start with  I will place orders for repeat UA and CX to do tomorrow, but if the behavior persists or worsens or she is a danger to herself or others, she should go to ER but evaluation, and possible psychiatric admission

## 2017-02-01 NOTE — Telephone Encounter (Signed)
Pt's daughter called stating that her mother was recently seen by Dr Jonny RuizJohn for a UTI. She has completed the antibiotic but she is still experiencing weird symptoms. The daughter is concerned and does not know what to do. She said that there was a confusion with the Seroquel. I did let her know that it was sent to the pharmacy so they should be picking that up soon. She said that her mother is seeing demons. She called her today saying that she does not want her nurse or anyone coming over because she thinks that the nurse is putting stuff in her food, in the water, in her room, etc. Her niece stayed the night with her last and she said that the pt woke her up continuously saying that she was seeing things and was convinced that the niece was in her room when she actually was not. She said that she is very aware of other things like what day it is, how much money she has, etc. The daughter stated that this is very uncommon for her mother and she does not know what to do. She even mentioned possibly having to quit her job to take care of her. Is there anything that Dr Jonny RuizJohn would recommend? Do you think she could still have a UTI that is causing this?

## 2017-02-07 ENCOUNTER — Other Ambulatory Visit: Payer: Self-pay | Admitting: Internal Medicine

## 2017-02-07 ENCOUNTER — Telehealth: Payer: Self-pay | Admitting: Internal Medicine

## 2017-02-07 NOTE — Telephone Encounter (Signed)
Daughter has called in regard to patient.  States she has blood in her urine today and states that patient has gotten further delusional since last OV.  States patient is wanting to fight everyone.  I have advised daughter to take patient to the ED or call EMS for transport.  Daughter stated she understood and would do this.

## 2017-02-08 ENCOUNTER — Encounter (HOSPITAL_COMMUNITY): Payer: Self-pay | Admitting: Emergency Medicine

## 2017-02-08 ENCOUNTER — Telehealth: Payer: Self-pay | Admitting: Internal Medicine

## 2017-02-08 DIAGNOSIS — Z5321 Procedure and treatment not carried out due to patient leaving prior to being seen by health care provider: Secondary | ICD-10-CM | POA: Insufficient documentation

## 2017-02-08 DIAGNOSIS — R4182 Altered mental status, unspecified: Secondary | ICD-10-CM | POA: Insufficient documentation

## 2017-02-08 DIAGNOSIS — R440 Auditory hallucinations: Secondary | ICD-10-CM | POA: Diagnosis not present

## 2017-02-08 LAB — URINALYSIS, ROUTINE W REFLEX MICROSCOPIC
Bacteria, UA: NONE SEEN
Bilirubin Urine: NEGATIVE
Glucose, UA: NEGATIVE mg/dL
Ketones, ur: NEGATIVE mg/dL
Nitrite: NEGATIVE
Protein, ur: NEGATIVE mg/dL
Specific Gravity, Urine: 1.014 (ref 1.005–1.030)
pH: 5 (ref 5.0–8.0)

## 2017-02-08 LAB — CBC
HCT: 38.4 % (ref 36.0–46.0)
Hemoglobin: 12.4 g/dL (ref 12.0–15.0)
MCH: 30 pg (ref 26.0–34.0)
MCHC: 32.3 g/dL (ref 30.0–36.0)
MCV: 93 fL (ref 78.0–100.0)
Platelets: 296 10*3/uL (ref 150–400)
RBC: 4.13 MIL/uL (ref 3.87–5.11)
RDW: 14.2 % (ref 11.5–15.5)
WBC: 7.3 10*3/uL (ref 4.0–10.5)

## 2017-02-08 LAB — BASIC METABOLIC PANEL
Anion gap: 10 (ref 5–15)
BUN: 16 mg/dL (ref 6–20)
CO2: 28 mmol/L (ref 22–32)
Calcium: 9.7 mg/dL (ref 8.9–10.3)
Chloride: 101 mmol/L (ref 101–111)
Creatinine, Ser: 1.03 mg/dL — ABNORMAL HIGH (ref 0.44–1.00)
GFR calc Af Amer: 54 mL/min — ABNORMAL LOW (ref >60)
GFR calc non Af Amer: 46 mL/min — ABNORMAL LOW (ref >60)
Glucose, Bld: 184 mg/dL — ABNORMAL HIGH (ref 65–99)
Potassium: 3.2 mmol/L — ABNORMAL LOW (ref 3.5–5.1)
Sodium: 139 mmol/L (ref 135–145)

## 2017-02-08 NOTE — Telephone Encounter (Signed)
Pt.notified

## 2017-02-08 NOTE — Telephone Encounter (Signed)
Faxed

## 2017-02-08 NOTE — ED Triage Notes (Signed)
Pt arrives with daughter to ED for hallucinations for the last month pt has been seeing things at home. Pt is paranoid that her aids have been doing [employer] officer[employer]itch craft and has been "firing" them. Daughter states she was treated last week for a UTI.

## 2017-02-08 NOTE — Telephone Encounter (Signed)
Noted  

## 2017-02-08 NOTE — Telephone Encounter (Signed)
Done hardcopy to Shirron  

## 2017-02-08 NOTE — Telephone Encounter (Signed)
Tramadol too soon as was just done earlier today

## 2017-02-08 NOTE — Telephone Encounter (Signed)
Pt daughter called stating her mother needs a refill of her traMADol (ULTRAM) 50 MG tablet    [facility] [address] Last OV was 09/16/26-Next one sch for 9/18

## 2017-02-08 NOTE — ED Notes (Signed)
Pt's daughter said that her mother was too uncomfortable to stay any longer.  RN requested that pt return if her symptoms get better.

## 2017-02-09 ENCOUNTER — Emergency Department (HOSPITAL_COMMUNITY)
Admission: EM | Admit: 2017-02-09 | Discharge: 2017-02-09 | Disposition: A | Discharge status: Home / Self Care | Payer: Medicare Other | Attending: Emergency Medicine | Admitting: Emergency Medicine

## 2017-02-09 MED ORDER — POTASSIUM CHLORIDE ER 10 MEQ PO TBCR: 10.0000 meq | EXTENDED_RELEASE_TABLET | Freq: Every day | ORAL | 3 refills | Status: DC

## 2017-02-09 NOTE — Telephone Encounter (Signed)
Labs are ok except for low K 3.2.  This is likely due to the diuretic.  We can add potassium 10 meq per day to resolve this.  I will do rx

## 2017-02-09 NOTE — Telephone Encounter (Signed)
Called pt's daughter, LVM with below info

## 2017-02-09 NOTE — Telephone Encounter (Signed)
Daughter called back.  States that she took patient to the ED and they ended up leaving b/c of the wait time.  Daughter states that patient did have labs done.  Would like Dr. Jonny RuizJohn to take a look at lab results.  I have scheduled patient with Dr. Jonny RuizJohn.  Daughter could not bring patient in until next Tuesday the 14th.  I have scheduled her for this day at 4pm.  Daughter would like a call back in regard after Dr. Jonny RuizJohn reviews labs.  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 [place]

## 2017-02-15 ENCOUNTER — Ambulatory Visit: Payer: Medicare Other | Admitting: Internal Medicine

## 2017-02-21 ENCOUNTER — Telehealth: Payer: Self-pay | Admitting: Internal Medicine

## 2017-02-21 DIAGNOSIS — F0391 Unspecified dementia with behavioral disturbance: Secondary | ICD-10-CM

## 2017-02-21 NOTE — Telephone Encounter (Signed)
Daughter called stating The Pts hallucinations are getting worse and would like a referral to neurology. The daughter prefers Dr. Patrcia Dolly Please advise ref

## 2017-02-21 NOTE — Telephone Encounter (Signed)
Called pt's daughter, [name], LVM informing her referral to neurology has been put in.

## 2017-02-21 NOTE — Telephone Encounter (Signed)
Ok, this is done 

## 2017-02-28 ENCOUNTER — Encounter: Payer: Self-pay | Admitting: Neurology

## 2017-03-02 ENCOUNTER — Telehealth: Payer: Self-pay | Admitting: Internal Medicine

## 2017-03-02 DIAGNOSIS — N3000 Acute cystitis without hematuria: Secondary | ICD-10-CM

## 2017-03-02 NOTE — Telephone Encounter (Signed)
Daughter does not believe that her mother's UTI is cleared up. She is still having sx. She would like to come by the office and pick up a hat and a cup and take a sample down the lab to be tested again. Please advise. Thank you.   Please follow up with Daughter, [name].

## 2017-03-02 NOTE — Telephone Encounter (Signed)
LVM to inform daughter that order has been placed.

## 2017-03-02 NOTE — Telephone Encounter (Signed)
Ok for urine tests - I have ordered

## 2017-03-03 ENCOUNTER — Other Ambulatory Visit: Payer: Medicare Other

## 2017-03-04 ENCOUNTER — Encounter: Payer: Self-pay | Admitting: Internal Medicine

## 2017-03-04 ENCOUNTER — Other Ambulatory Visit: Payer: Self-pay | Admitting: Internal Medicine

## 2017-03-04 ENCOUNTER — Other Ambulatory Visit (INDEPENDENT_AMBULATORY_CARE_PROVIDER_SITE_OTHER): Payer: Medicare Other

## 2017-03-04 DIAGNOSIS — N3000 Acute cystitis without hematuria: Secondary | ICD-10-CM | POA: Diagnosis not present

## 2017-03-04 LAB — URINALYSIS, ROUTINE W REFLEX MICROSCOPIC
Bilirubin Urine: NEGATIVE
Hgb urine dipstick: NEGATIVE
Ketones, ur: NEGATIVE
Nitrite: POSITIVE — AB
Specific Gravity, Urine: 1.025 (ref 1.000–1.030)
Urine Glucose: NEGATIVE
Urobilinogen, UA: 0.2 (ref 0.0–1.0)
pH: 6 (ref 5.0–8.0)

## 2017-03-04 MED ORDER — NITROFURANTOIN MONOHYD MACRO 100 MG PO CAPS: 100.0000 mg | ORAL_CAPSULE | Freq: Two times a day (BID) | ORAL | 0 refills | Status: DC

## 2017-03-05 LAB — URINE CULTURE

## 2017-03-08 ENCOUNTER — Other Ambulatory Visit: Payer: Self-pay | Admitting: Internal Medicine

## 2017-03-08 ENCOUNTER — Telehealth: Payer: Self-pay

## 2017-03-08 DIAGNOSIS — N390 Urinary tract infection, site not specified: Secondary | ICD-10-CM

## 2017-03-08 NOTE — Telephone Encounter (Signed)
Pt's daughter, Sydney Lowery, has been informed and expressed understanding. Daughter is upset that her concerns was not being heard when she stated her mothers symptoms was getting worse. She would like to know if her mother should go to the hospital for possible IV antibiotics to clear up the UTI since medications did not help from the last OV. Please advise.  **Call back with follow up at 1:30pm.

## 2017-03-08 NOTE — Telephone Encounter (Signed)
Unfortunately older persons with recurrent infections are susceptible to increasing becoming resistant to some and even eventually almost all oral antibiotics.    All we can do treat as they come up.  I can refer to Urology if daughter would like this., thanks

## 2017-03-08 NOTE — Telephone Encounter (Signed)
-----   Message from Corwin LevinsJames W John, MD sent at 03/04/2017  2:43 PM EDT ----- Letter sent, cont same tx except   The test results show that your current treatment is OK, except the urine may be consistent with infection.  We will send and antibiotic, and you should hear from the office as well.    Shirron to please inform pt, I will do rx

## 2017-03-08 NOTE — Telephone Encounter (Signed)
Called pt's daughter, Sydney Lowery, left detailed msg with the details below.

## 2017-03-11 ENCOUNTER — Ambulatory Visit: Payer: Medicare Other | Admitting: Neurology

## 2017-03-23 ENCOUNTER — Ambulatory Visit: Payer: Medicare Other | Admitting: Internal Medicine

## 2017-03-24 ENCOUNTER — Other Ambulatory Visit: Payer: Self-pay | Admitting: Internal Medicine

## 2017-03-25 NOTE — Telephone Encounter (Signed)
If there is suspicion for infection, sepsis, or new worsening confusion, weakness or falls, she should go to ED now

## 2017-03-25 NOTE — Telephone Encounter (Signed)
OK for urology referral  Please let daughter know that she can change her mother's PCP at any time, at her discretion  I would still recommend going to ED if there is possible sepsis as this is a medical emergency where she might die by tomorrow, and urology referrals always take several wks usually  Also, if the daughter changes her mind about whether this is a life threatening situation that I was led to believe by the last notes, she could consider being seen at [facility] in the AM

## 2017-03-25 NOTE — Telephone Encounter (Addendum)
Pt's daughter declined taking her to the ED and she would like a referral to urologist. She is concerned about her mother and stated that she didn't appreciate the conversation that was had during the last OV. Stating JJ had said "Well she is old and will keep getting UTI's" and went on to say that it is his job to give her the best quality of life and she doesn't feel like that it happening. She strongly urges the need for a referral to a specialist because the wait times at the ED were extremely long and uncomfortable for her mother to sit through. Please advise.

## 2017-03-25 NOTE — Addendum Note (Signed)
Addended by: Corwin Levins on: 03/25/2017 02:52 PM   Modules accepted: Orders

## 2017-03-25 NOTE — Telephone Encounter (Signed)
Called pt's daughter, LVM stating sorry for the misunderstanding of communication from last OV did suggest to change PCP is she likes. Stated that JJ still recommends to take her to ED but if not to call the office for [place] to schedule her to come to Saturday clinic tomorrow.

## 2017-03-25 NOTE — Telephone Encounter (Signed)
Pt daughter called and would like the referral to the Urologists, she states her mother is in bad shape, she believes her mother might be septic, her mother is seeing things and is terrified of the things she is seeing.  She is scared to be alone.  Please advise about the referral.  She states she is lucid regarding money, day of time and other things but sees images that frighten her.

## 2017-03-30 ENCOUNTER — Telehealth: Payer: Self-pay | Admitting: Internal Medicine

## 2017-03-30 NOTE — Telephone Encounter (Signed)
Pt daughter called in and said that she think mom has Gout in knee, she had to cancel appt because she is not able to walk.  She would like to know if Dr Jonny Ruiz could call in gout meds for her?    [facility]

## 2017-03-31 ENCOUNTER — Ambulatory Visit: Payer: Medicare Other | Admitting: Internal Medicine

## 2017-03-31 MED ORDER — PREDNISONE 10 MG PO TABS: ORAL_TABLET | ORAL | 0 refills | Status: DC

## 2017-03-31 NOTE — Telephone Encounter (Signed)
Notified pt daughter Corrie Dandy) w/ MD response...Raechel Chute

## 2017-03-31 NOTE — Telephone Encounter (Signed)
Notified pty daughter (

## 2017-03-31 NOTE — Telephone Encounter (Signed)
Ok for prednisone course  - done erx  Pt will need to go to ED if not improving in 1-2 days and still unable to walk

## 2017-04-07 ENCOUNTER — Telehealth: Payer: Self-pay

## 2017-04-07 NOTE — Telephone Encounter (Signed)
Tiffany with [facility] came by the office. She stated that she felt pt had another UTI.  Pt is telling her that there are demons around her.  I advised that there were lab ordered for pt. Tiffany stated that she would bring pt in to leave a sample.

## 2017-04-08 ENCOUNTER — Other Ambulatory Visit: Payer: Medicare Other

## 2017-04-08 DIAGNOSIS — R300 Dysuria: Secondary | ICD-10-CM

## 2017-04-09 LAB — URINE CULTURE
MICRO NUMBER:: 81110239
SPECIMEN QUALITY:: ADEQUATE

## 2017-04-11 ENCOUNTER — Telehealth: Payer: Self-pay | Admitting: Internal Medicine

## 2017-04-11 DIAGNOSIS — M21371 Foot drop, right foot: Secondary | ICD-10-CM

## 2017-04-11 DIAGNOSIS — R269 Unspecified abnormalities of gait and mobility: Secondary | ICD-10-CM

## 2017-04-11 DIAGNOSIS — R531 Weakness: Secondary | ICD-10-CM

## 2017-04-11 DIAGNOSIS — M21372 Foot drop, left foot: Secondary | ICD-10-CM

## 2017-04-11 NOTE — Telephone Encounter (Signed)
lvm for Tiffany to call back.

## 2017-04-11 NOTE — Telephone Encounter (Signed)
Tiffany (RN with [facility]) called following up with you. She can be reached at [phone].

## 2017-04-12 ENCOUNTER — Encounter: Payer: Self-pay | Admitting: Internal Medicine

## 2017-04-12 NOTE — Addendum Note (Signed)
Addended by: Corwin Levins on: 04/12/2017 04:56 PM   Modules accepted: Orders

## 2017-04-12 NOTE — Telephone Encounter (Signed)
Request for [facility] with PT. Pt is not currently able to walk well with noticeable gait instability.   Are you okay with HH with PT order? Do you need pt to come in for an appt?

## 2017-04-12 NOTE — Telephone Encounter (Signed)
I have placed order

## 2017-04-14 DIAGNOSIS — M21371 Foot drop, right foot: Secondary | ICD-10-CM | POA: Diagnosis not present

## 2017-04-14 DIAGNOSIS — M21372 Foot drop, left foot: Secondary | ICD-10-CM | POA: Diagnosis not present

## 2017-04-14 DIAGNOSIS — M1990 Unspecified osteoarthritis, unspecified site: Secondary | ICD-10-CM | POA: Diagnosis not present

## 2017-04-14 DIAGNOSIS — Z87440 Personal history of urinary (tract) infections: Secondary | ICD-10-CM | POA: Diagnosis not present

## 2017-04-14 DIAGNOSIS — M6281 Muscle weakness (generalized): Secondary | ICD-10-CM | POA: Diagnosis not present

## 2017-04-19 ENCOUNTER — Telehealth: Payer: Self-pay | Admitting: Internal Medicine

## 2017-04-19 DIAGNOSIS — M21372 Foot drop, left foot: Secondary | ICD-10-CM | POA: Diagnosis not present

## 2017-04-19 DIAGNOSIS — M1990 Unspecified osteoarthritis, unspecified site: Secondary | ICD-10-CM | POA: Diagnosis not present

## 2017-04-19 DIAGNOSIS — M6281 Muscle weakness (generalized): Secondary | ICD-10-CM | POA: Diagnosis not present

## 2017-04-19 DIAGNOSIS — Z87440 Personal history of urinary (tract) infections: Secondary | ICD-10-CM | POA: Diagnosis not present

## 2017-04-19 DIAGNOSIS — M21371 Foot drop, right foot: Secondary | ICD-10-CM | POA: Diagnosis not present

## 2017-04-19 NOTE — Telephone Encounter (Signed)
Ok,noted

## 2017-04-19 NOTE — Telephone Encounter (Signed)
Spoke with pt's daughter, [name], she declined taking the patient to the the ED because when she checked the O2 it was up to 94%. She also stated a [place] physician is suppose to be coming out to see the patient.    Call back number for [place] [phone]

## 2017-04-19 NOTE — Telephone Encounter (Signed)
Needs verbals for Pt for 1week and 2week 5 for strenghting and gait balance training and home exercise program and safe transfers and home safety.    Patient home oxygen levels today at rest were 87 and and doing activity went down to 83

## 2017-04-19 NOTE — Telephone Encounter (Signed)
Ok for verbals  Pt should go to ED NOW for evaluation of probable acute hypoxic respiratory failure

## 2017-04-21 ENCOUNTER — Telehealth: Payer: Self-pay | Admitting: Internal Medicine

## 2017-04-21 DIAGNOSIS — M21372 Foot drop, left foot: Secondary | ICD-10-CM | POA: Diagnosis not present

## 2017-04-21 DIAGNOSIS — M21371 Foot drop, right foot: Secondary | ICD-10-CM | POA: Diagnosis not present

## 2017-04-21 DIAGNOSIS — Z87440 Personal history of urinary (tract) infections: Secondary | ICD-10-CM | POA: Diagnosis not present

## 2017-04-21 DIAGNOSIS — M6281 Muscle weakness (generalized): Secondary | ICD-10-CM | POA: Diagnosis not present

## 2017-04-21 DIAGNOSIS — M1990 Unspecified osteoarthritis, unspecified site: Secondary | ICD-10-CM | POA: Diagnosis not present

## 2017-04-21 MED ORDER — COLCHICINE 0.6 MG PO TABS: 0.6000 mg | ORAL_TABLET | Freq: Every day | ORAL | 3 refills | Status: DC

## 2017-04-21 NOTE — Telephone Encounter (Signed)
Done erx 

## 2017-04-21 NOTE — Telephone Encounter (Signed)
Pls advise on msg below.../lmb 

## 2017-04-21 NOTE — Telephone Encounter (Signed)
Notified pt/family MD [facility]...Raechel Chute/lmb

## 2017-04-21 NOTE — Telephone Encounter (Signed)
Called and needs a refill of her colchicine 0.6 MG tablet  Pt is having knee pain  Please advise  [facility] [address]

## 2017-04-23 DIAGNOSIS — M6281 Muscle weakness (generalized): Secondary | ICD-10-CM | POA: Diagnosis not present

## 2017-04-23 DIAGNOSIS — I10 Essential (primary) hypertension: Secondary | ICD-10-CM | POA: Diagnosis not present

## 2017-04-23 DIAGNOSIS — E876 Hypokalemia: Secondary | ICD-10-CM | POA: Diagnosis not present

## 2017-04-23 DIAGNOSIS — N390 Urinary tract infection, site not specified: Secondary | ICD-10-CM | POA: Diagnosis not present

## 2017-04-23 DIAGNOSIS — Z0000 Encounter for general adult medical examination without abnormal findings: Secondary | ICD-10-CM | POA: Diagnosis not present

## 2017-04-26 DIAGNOSIS — M6281 Muscle weakness (generalized): Secondary | ICD-10-CM | POA: Diagnosis not present

## 2017-04-26 DIAGNOSIS — M1990 Unspecified osteoarthritis, unspecified site: Secondary | ICD-10-CM | POA: Diagnosis not present

## 2017-04-26 DIAGNOSIS — M21372 Foot drop, left foot: Secondary | ICD-10-CM | POA: Diagnosis not present

## 2017-04-26 DIAGNOSIS — M21371 Foot drop, right foot: Secondary | ICD-10-CM | POA: Diagnosis not present

## 2017-04-26 DIAGNOSIS — Z87440 Personal history of urinary (tract) infections: Secondary | ICD-10-CM | POA: Diagnosis not present

## 2017-04-28 DIAGNOSIS — Z87440 Personal history of urinary (tract) infections: Secondary | ICD-10-CM | POA: Diagnosis not present

## 2017-04-28 DIAGNOSIS — M21371 Foot drop, right foot: Secondary | ICD-10-CM | POA: Diagnosis not present

## 2017-04-28 DIAGNOSIS — M6281 Muscle weakness (generalized): Secondary | ICD-10-CM | POA: Diagnosis not present

## 2017-04-28 DIAGNOSIS — M1990 Unspecified osteoarthritis, unspecified site: Secondary | ICD-10-CM | POA: Diagnosis not present

## 2017-04-28 DIAGNOSIS — M21372 Foot drop, left foot: Secondary | ICD-10-CM | POA: Diagnosis not present

## 2017-05-03 DIAGNOSIS — M6281 Muscle weakness (generalized): Secondary | ICD-10-CM | POA: Diagnosis not present

## 2017-05-03 DIAGNOSIS — N390 Urinary tract infection, site not specified: Secondary | ICD-10-CM | POA: Diagnosis not present

## 2017-05-03 DIAGNOSIS — M1990 Unspecified osteoarthritis, unspecified site: Secondary | ICD-10-CM | POA: Diagnosis not present

## 2017-05-03 DIAGNOSIS — M21372 Foot drop, left foot: Secondary | ICD-10-CM | POA: Diagnosis not present

## 2017-05-03 DIAGNOSIS — Z87440 Personal history of urinary (tract) infections: Secondary | ICD-10-CM | POA: Diagnosis not present

## 2017-05-03 DIAGNOSIS — M21371 Foot drop, right foot: Secondary | ICD-10-CM | POA: Diagnosis not present

## 2017-05-05 DIAGNOSIS — M21371 Foot drop, right foot: Secondary | ICD-10-CM | POA: Diagnosis not present

## 2017-05-05 DIAGNOSIS — Z87440 Personal history of urinary (tract) infections: Secondary | ICD-10-CM | POA: Diagnosis not present

## 2017-05-05 DIAGNOSIS — M21372 Foot drop, left foot: Secondary | ICD-10-CM | POA: Diagnosis not present

## 2017-05-05 DIAGNOSIS — M1990 Unspecified osteoarthritis, unspecified site: Secondary | ICD-10-CM | POA: Diagnosis not present

## 2017-05-05 DIAGNOSIS — M6281 Muscle weakness (generalized): Secondary | ICD-10-CM | POA: Diagnosis not present

## 2017-05-06 ENCOUNTER — Other Ambulatory Visit (HOSPITAL_COMMUNITY)
Admission: AD | Admit: 2017-05-06 | Discharge: 2017-05-06 | Disposition: A | Discharge status: Home / Self Care | Payer: Medicare Other | Source: Other Acute Inpatient Hospital | Attending: Urology | Admitting: Urology

## 2017-05-06 DIAGNOSIS — M21372 Foot drop, left foot: Secondary | ICD-10-CM | POA: Diagnosis not present

## 2017-05-06 DIAGNOSIS — M1990 Unspecified osteoarthritis, unspecified site: Secondary | ICD-10-CM | POA: Diagnosis not present

## 2017-05-06 DIAGNOSIS — M21371 Foot drop, right foot: Secondary | ICD-10-CM | POA: Diagnosis not present

## 2017-05-06 DIAGNOSIS — M6281 Muscle weakness (generalized): Secondary | ICD-10-CM | POA: Diagnosis not present

## 2017-05-06 DIAGNOSIS — N390 Urinary tract infection, site not specified: Secondary | ICD-10-CM | POA: Insufficient documentation

## 2017-05-06 DIAGNOSIS — Z87440 Personal history of urinary (tract) infections: Secondary | ICD-10-CM | POA: Diagnosis not present

## 2017-05-06 LAB — CBC WITH DIFFERENTIAL/PLATELET
Basophils Absolute: 0 10*3/uL (ref 0.0–0.1)
Basophils Relative: 0 %
Eosinophils Absolute: 0 10*3/uL (ref 0.0–0.7)
Eosinophils Relative: 0 %
HCT: 35 % — ABNORMAL LOW (ref 36.0–46.0)
Hemoglobin: 10.9 g/dL — ABNORMAL LOW (ref 12.0–15.0)
Lymphocytes Relative: 12 %
Lymphs Abs: 1 10*3/uL (ref 0.7–4.0)
MCH: 29.4 pg (ref 26.0–34.0)
MCHC: 31.1 g/dL (ref 30.0–36.0)
MCV: 94.3 fL (ref 78.0–100.0)
Monocytes Absolute: 0.5 10*3/uL (ref 0.1–1.0)
Monocytes Relative: 6 %
Neutro Abs: 6.5 10*3/uL (ref 1.7–7.7)
Neutrophils Relative %: 82 %
Platelets: 285 10*3/uL (ref 150–400)
RBC: 3.71 MIL/uL — ABNORMAL LOW (ref 3.87–5.11)
RDW: 15 % (ref 11.5–15.5)
WBC: 8 10*3/uL (ref 4.0–10.5)

## 2017-05-10 DIAGNOSIS — M6281 Muscle weakness (generalized): Secondary | ICD-10-CM | POA: Diagnosis not present

## 2017-05-10 DIAGNOSIS — Z87440 Personal history of urinary (tract) infections: Secondary | ICD-10-CM | POA: Diagnosis not present

## 2017-05-10 DIAGNOSIS — M21371 Foot drop, right foot: Secondary | ICD-10-CM | POA: Diagnosis not present

## 2017-05-10 DIAGNOSIS — M21372 Foot drop, left foot: Secondary | ICD-10-CM | POA: Diagnosis not present

## 2017-05-10 DIAGNOSIS — M1990 Unspecified osteoarthritis, unspecified site: Secondary | ICD-10-CM | POA: Diagnosis not present

## 2017-05-13 DIAGNOSIS — M6281 Muscle weakness (generalized): Secondary | ICD-10-CM | POA: Diagnosis not present

## 2017-05-13 DIAGNOSIS — M1990 Unspecified osteoarthritis, unspecified site: Secondary | ICD-10-CM | POA: Diagnosis not present

## 2017-05-13 DIAGNOSIS — Z87440 Personal history of urinary (tract) infections: Secondary | ICD-10-CM | POA: Diagnosis not present

## 2017-05-13 DIAGNOSIS — M21371 Foot drop, right foot: Secondary | ICD-10-CM | POA: Diagnosis not present

## 2017-05-13 DIAGNOSIS — M21372 Foot drop, left foot: Secondary | ICD-10-CM | POA: Diagnosis not present

## 2017-05-17 DIAGNOSIS — M21372 Foot drop, left foot: Secondary | ICD-10-CM | POA: Diagnosis not present

## 2017-05-17 DIAGNOSIS — M21371 Foot drop, right foot: Secondary | ICD-10-CM | POA: Diagnosis not present

## 2017-05-17 DIAGNOSIS — M6281 Muscle weakness (generalized): Secondary | ICD-10-CM | POA: Diagnosis not present

## 2017-05-17 DIAGNOSIS — Z87440 Personal history of urinary (tract) infections: Secondary | ICD-10-CM | POA: Diagnosis not present

## 2017-05-17 DIAGNOSIS — M1990 Unspecified osteoarthritis, unspecified site: Secondary | ICD-10-CM | POA: Diagnosis not present

## 2017-05-20 DIAGNOSIS — M1990 Unspecified osteoarthritis, unspecified site: Secondary | ICD-10-CM | POA: Diagnosis not present

## 2017-05-20 DIAGNOSIS — Z87440 Personal history of urinary (tract) infections: Secondary | ICD-10-CM | POA: Diagnosis not present

## 2017-05-20 DIAGNOSIS — M6281 Muscle weakness (generalized): Secondary | ICD-10-CM | POA: Diagnosis not present

## 2017-05-20 DIAGNOSIS — M21371 Foot drop, right foot: Secondary | ICD-10-CM | POA: Diagnosis not present

## 2017-05-20 DIAGNOSIS — M21372 Foot drop, left foot: Secondary | ICD-10-CM | POA: Diagnosis not present

## 2017-05-24 DIAGNOSIS — M6281 Muscle weakness (generalized): Secondary | ICD-10-CM | POA: Diagnosis not present

## 2017-05-24 DIAGNOSIS — M1990 Unspecified osteoarthritis, unspecified site: Secondary | ICD-10-CM | POA: Diagnosis not present

## 2017-05-24 DIAGNOSIS — Z87440 Personal history of urinary (tract) infections: Secondary | ICD-10-CM | POA: Diagnosis not present

## 2017-05-24 DIAGNOSIS — M21372 Foot drop, left foot: Secondary | ICD-10-CM | POA: Diagnosis not present

## 2017-05-24 DIAGNOSIS — M21371 Foot drop, right foot: Secondary | ICD-10-CM | POA: Diagnosis not present

## 2017-05-25 DIAGNOSIS — J449 Chronic obstructive pulmonary disease, unspecified: Secondary | ICD-10-CM | POA: Diagnosis not present

## 2017-05-25 DIAGNOSIS — I2781 Cor pulmonale (chronic): Secondary | ICD-10-CM | POA: Diagnosis not present

## 2017-05-25 DIAGNOSIS — I10 Essential (primary) hypertension: Secondary | ICD-10-CM | POA: Diagnosis not present

## 2017-05-27 DIAGNOSIS — M6281 Muscle weakness (generalized): Secondary | ICD-10-CM | POA: Diagnosis not present

## 2017-05-27 DIAGNOSIS — M1990 Unspecified osteoarthritis, unspecified site: Secondary | ICD-10-CM | POA: Diagnosis not present

## 2017-05-27 DIAGNOSIS — M21371 Foot drop, right foot: Secondary | ICD-10-CM | POA: Diagnosis not present

## 2017-05-27 DIAGNOSIS — M21372 Foot drop, left foot: Secondary | ICD-10-CM | POA: Diagnosis not present

## 2017-05-27 DIAGNOSIS — Z87440 Personal history of urinary (tract) infections: Secondary | ICD-10-CM | POA: Diagnosis not present

## 2017-06-06 DIAGNOSIS — J449 Chronic obstructive pulmonary disease, unspecified: Secondary | ICD-10-CM | POA: Diagnosis not present

## 2017-06-06 DIAGNOSIS — R0902 Hypoxemia: Secondary | ICD-10-CM | POA: Diagnosis not present

## 2017-06-21 DIAGNOSIS — J449 Chronic obstructive pulmonary disease, unspecified: Secondary | ICD-10-CM | POA: Diagnosis not present

## 2017-06-21 DIAGNOSIS — B351 Tinea unguium: Secondary | ICD-10-CM | POA: Diagnosis not present

## 2017-06-21 DIAGNOSIS — M109 Gout, unspecified: Secondary | ICD-10-CM | POA: Diagnosis not present

## 2017-06-21 DIAGNOSIS — I10 Essential (primary) hypertension: Secondary | ICD-10-CM | POA: Diagnosis not present

## 2017-06-29 ENCOUNTER — Other Ambulatory Visit: Payer: Self-pay | Admitting: Internal Medicine

## 2017-07-07 DIAGNOSIS — J449 Chronic obstructive pulmonary disease, unspecified: Secondary | ICD-10-CM | POA: Diagnosis not present

## 2017-07-26 ENCOUNTER — Other Ambulatory Visit: Payer: Self-pay | Admitting: Internal Medicine

## 2017-07-26 DIAGNOSIS — M25572 Pain in left ankle and joints of left foot: Secondary | ICD-10-CM | POA: Diagnosis not present

## 2017-07-26 DIAGNOSIS — L84 Corns and callosities: Secondary | ICD-10-CM | POA: Diagnosis not present

## 2017-07-26 DIAGNOSIS — B351 Tinea unguium: Secondary | ICD-10-CM | POA: Diagnosis not present

## 2017-07-26 DIAGNOSIS — I739 Peripheral vascular disease, unspecified: Secondary | ICD-10-CM | POA: Diagnosis not present

## 2017-07-27 NOTE — Telephone Encounter (Signed)
Done erx 

## 2017-07-28 DIAGNOSIS — I10 Essential (primary) hypertension: Secondary | ICD-10-CM | POA: Diagnosis not present

## 2017-07-28 DIAGNOSIS — Z720 Tobacco use: Secondary | ICD-10-CM | POA: Diagnosis not present

## 2017-07-28 DIAGNOSIS — M109 Gout, unspecified: Secondary | ICD-10-CM | POA: Diagnosis not present

## 2017-07-28 DIAGNOSIS — J449 Chronic obstructive pulmonary disease, unspecified: Secondary | ICD-10-CM | POA: Diagnosis not present

## 2017-08-01 DIAGNOSIS — M109 Gout, unspecified: Secondary | ICD-10-CM | POA: Diagnosis not present

## 2017-08-01 DIAGNOSIS — I10 Essential (primary) hypertension: Secondary | ICD-10-CM | POA: Diagnosis not present

## 2017-08-01 DIAGNOSIS — M19071 Primary osteoarthritis, right ankle and foot: Secondary | ICD-10-CM | POA: Diagnosis not present

## 2017-08-01 DIAGNOSIS — J449 Chronic obstructive pulmonary disease, unspecified: Secondary | ICD-10-CM | POA: Diagnosis not present

## 2017-08-01 DIAGNOSIS — M19072 Primary osteoarthritis, left ankle and foot: Secondary | ICD-10-CM | POA: Diagnosis not present

## 2017-08-07 DIAGNOSIS — J449 Chronic obstructive pulmonary disease, unspecified: Secondary | ICD-10-CM | POA: Diagnosis not present

## 2017-08-10 DIAGNOSIS — M19072 Primary osteoarthritis, left ankle and foot: Secondary | ICD-10-CM | POA: Diagnosis not present

## 2017-08-10 DIAGNOSIS — M109 Gout, unspecified: Secondary | ICD-10-CM | POA: Diagnosis not present

## 2017-08-10 DIAGNOSIS — J449 Chronic obstructive pulmonary disease, unspecified: Secondary | ICD-10-CM | POA: Diagnosis not present

## 2017-08-10 DIAGNOSIS — I10 Essential (primary) hypertension: Secondary | ICD-10-CM | POA: Diagnosis not present

## 2017-08-10 DIAGNOSIS — M19071 Primary osteoarthritis, right ankle and foot: Secondary | ICD-10-CM | POA: Diagnosis not present

## 2017-08-16 DIAGNOSIS — M109 Gout, unspecified: Secondary | ICD-10-CM | POA: Diagnosis not present

## 2017-08-16 DIAGNOSIS — M19071 Primary osteoarthritis, right ankle and foot: Secondary | ICD-10-CM | POA: Diagnosis not present

## 2017-08-16 DIAGNOSIS — J449 Chronic obstructive pulmonary disease, unspecified: Secondary | ICD-10-CM | POA: Diagnosis not present

## 2017-08-16 DIAGNOSIS — I10 Essential (primary) hypertension: Secondary | ICD-10-CM | POA: Diagnosis not present

## 2017-08-16 DIAGNOSIS — M19072 Primary osteoarthritis, left ankle and foot: Secondary | ICD-10-CM | POA: Diagnosis not present

## 2017-08-18 DIAGNOSIS — J449 Chronic obstructive pulmonary disease, unspecified: Secondary | ICD-10-CM | POA: Diagnosis not present

## 2017-08-18 DIAGNOSIS — M19071 Primary osteoarthritis, right ankle and foot: Secondary | ICD-10-CM | POA: Diagnosis not present

## 2017-08-18 DIAGNOSIS — M109 Gout, unspecified: Secondary | ICD-10-CM | POA: Diagnosis not present

## 2017-08-18 DIAGNOSIS — M19072 Primary osteoarthritis, left ankle and foot: Secondary | ICD-10-CM | POA: Diagnosis not present

## 2017-08-18 DIAGNOSIS — I10 Essential (primary) hypertension: Secondary | ICD-10-CM | POA: Diagnosis not present

## 2017-08-23 DIAGNOSIS — M109 Gout, unspecified: Secondary | ICD-10-CM | POA: Diagnosis not present

## 2017-08-23 DIAGNOSIS — M19071 Primary osteoarthritis, right ankle and foot: Secondary | ICD-10-CM | POA: Diagnosis not present

## 2017-08-23 DIAGNOSIS — I10 Essential (primary) hypertension: Secondary | ICD-10-CM | POA: Diagnosis not present

## 2017-08-23 DIAGNOSIS — J449 Chronic obstructive pulmonary disease, unspecified: Secondary | ICD-10-CM | POA: Diagnosis not present

## 2017-08-23 DIAGNOSIS — M19072 Primary osteoarthritis, left ankle and foot: Secondary | ICD-10-CM | POA: Diagnosis not present

## 2017-08-26 DIAGNOSIS — M19072 Primary osteoarthritis, left ankle and foot: Secondary | ICD-10-CM | POA: Diagnosis not present

## 2017-08-26 DIAGNOSIS — J449 Chronic obstructive pulmonary disease, unspecified: Secondary | ICD-10-CM | POA: Diagnosis not present

## 2017-08-26 DIAGNOSIS — I10 Essential (primary) hypertension: Secondary | ICD-10-CM | POA: Diagnosis not present

## 2017-08-26 DIAGNOSIS — M109 Gout, unspecified: Secondary | ICD-10-CM | POA: Diagnosis not present

## 2017-08-26 DIAGNOSIS — M19071 Primary osteoarthritis, right ankle and foot: Secondary | ICD-10-CM | POA: Diagnosis not present

## 2017-08-31 DIAGNOSIS — J449 Chronic obstructive pulmonary disease, unspecified: Secondary | ICD-10-CM | POA: Diagnosis not present

## 2017-08-31 DIAGNOSIS — M109 Gout, unspecified: Secondary | ICD-10-CM | POA: Diagnosis not present

## 2017-08-31 DIAGNOSIS — Z720 Tobacco use: Secondary | ICD-10-CM | POA: Diagnosis not present

## 2017-08-31 DIAGNOSIS — M19072 Primary osteoarthritis, left ankle and foot: Secondary | ICD-10-CM | POA: Diagnosis not present

## 2017-08-31 DIAGNOSIS — M19071 Primary osteoarthritis, right ankle and foot: Secondary | ICD-10-CM | POA: Diagnosis not present

## 2017-08-31 DIAGNOSIS — I10 Essential (primary) hypertension: Secondary | ICD-10-CM | POA: Diagnosis not present

## 2017-08-31 DIAGNOSIS — M1990 Unspecified osteoarthritis, unspecified site: Secondary | ICD-10-CM | POA: Diagnosis not present

## 2017-09-04 DIAGNOSIS — J449 Chronic obstructive pulmonary disease, unspecified: Secondary | ICD-10-CM | POA: Diagnosis not present

## 2017-09-07 DIAGNOSIS — I10 Essential (primary) hypertension: Secondary | ICD-10-CM | POA: Diagnosis not present

## 2017-09-07 DIAGNOSIS — M19072 Primary osteoarthritis, left ankle and foot: Secondary | ICD-10-CM | POA: Diagnosis not present

## 2017-09-07 DIAGNOSIS — M19071 Primary osteoarthritis, right ankle and foot: Secondary | ICD-10-CM | POA: Diagnosis not present

## 2017-09-07 DIAGNOSIS — M109 Gout, unspecified: Secondary | ICD-10-CM | POA: Diagnosis not present

## 2017-09-07 DIAGNOSIS — J449 Chronic obstructive pulmonary disease, unspecified: Secondary | ICD-10-CM | POA: Diagnosis not present

## 2017-09-21 ENCOUNTER — Other Ambulatory Visit: Payer: Self-pay | Admitting: Internal Medicine

## 2017-10-05 DIAGNOSIS — J449 Chronic obstructive pulmonary disease, unspecified: Secondary | ICD-10-CM | POA: Diagnosis not present

## 2017-10-22 ENCOUNTER — Other Ambulatory Visit: Payer: Self-pay | Admitting: Internal Medicine

## 2017-10-24 NOTE — Telephone Encounter (Signed)
Done erx 

## 2017-10-24 NOTE — Telephone Encounter (Signed)
09/12/2017 120# 

## 2017-11-04 DIAGNOSIS — G894 Chronic pain syndrome: Secondary | ICD-10-CM | POA: Diagnosis not present

## 2017-11-04 DIAGNOSIS — I10 Essential (primary) hypertension: Secondary | ICD-10-CM | POA: Diagnosis not present

## 2017-11-04 DIAGNOSIS — J449 Chronic obstructive pulmonary disease, unspecified: Secondary | ICD-10-CM | POA: Diagnosis not present

## 2017-12-05 DIAGNOSIS — J449 Chronic obstructive pulmonary disease, unspecified: Secondary | ICD-10-CM | POA: Diagnosis not present

## 2017-12-13 ENCOUNTER — Other Ambulatory Visit: Payer: Self-pay | Admitting: Internal Medicine

## 2018-01-04 DIAGNOSIS — J449 Chronic obstructive pulmonary disease, unspecified: Secondary | ICD-10-CM | POA: Diagnosis not present

## 2018-01-09 ENCOUNTER — Other Ambulatory Visit: Payer: Self-pay | Admitting: Internal Medicine

## 2018-01-09 NOTE — Telephone Encounter (Signed)
Done erx 

## 2018-01-21 DIAGNOSIS — E782 Mixed hyperlipidemia: Secondary | ICD-10-CM | POA: Diagnosis not present

## 2018-01-21 DIAGNOSIS — I10 Essential (primary) hypertension: Secondary | ICD-10-CM | POA: Diagnosis not present

## 2018-01-21 DIAGNOSIS — J449 Chronic obstructive pulmonary disease, unspecified: Secondary | ICD-10-CM | POA: Diagnosis not present

## 2018-01-21 DIAGNOSIS — M79609 Pain in unspecified limb: Secondary | ICD-10-CM | POA: Diagnosis not present

## 2018-02-04 DIAGNOSIS — J449 Chronic obstructive pulmonary disease, unspecified: Secondary | ICD-10-CM | POA: Diagnosis not present

## 2018-02-08 ENCOUNTER — Other Ambulatory Visit: Payer: Self-pay | Admitting: Internal Medicine

## 2018-02-22 ENCOUNTER — Other Ambulatory Visit: Payer: Self-pay | Admitting: Internal Medicine

## 2018-02-22 MED ORDER — AMLODIPINE BESYLATE 5 MG PO TABS: 5.0000 mg | ORAL_TABLET | Freq: Every day | ORAL | 0 refills | Status: DC

## 2018-02-22 NOTE — Telephone Encounter (Signed)
Copied from CRM [phone]. Topic: Quick Communication - See Telephone Encounter >> Feb 22, 2018 11:09 AM Tamela OddiMartin, [place], NT wrote: CRM for notification. See Telephone encounter for: 02/22/18. Justice RocherKarliegh calling from [facility] states the patient needs a refill of her amLODipine (NORVASC) 5 MG tablet  [facility] [place] [id number] [phone] (Phone) [phone] (Fax)

## 2018-02-22 NOTE — Telephone Encounter (Signed)
Rx refill request: amlodipine 5 mg       Last ordered: 12/13/17  LOV: 01/19/17  PCP: Jonny RuizJohn  Pharmacy: verified

## 2018-02-22 NOTE — Telephone Encounter (Signed)
Pt is overdue for annual appt can. Per office policy can send 30 day script to local [facility] pharmacy. . Pls call pt and make cpx appt w/PCP. [facility] supply to local [facility].Marland Kitchen.Raechel Chute/lmb

## 2018-03-02 DIAGNOSIS — N390 Urinary tract infection, site not specified: Secondary | ICD-10-CM | POA: Diagnosis not present

## 2018-03-04 DIAGNOSIS — M79609 Pain in unspecified limb: Secondary | ICD-10-CM | POA: Diagnosis not present

## 2018-03-04 DIAGNOSIS — I10 Essential (primary) hypertension: Secondary | ICD-10-CM | POA: Diagnosis not present

## 2018-03-04 DIAGNOSIS — Z720 Tobacco use: Secondary | ICD-10-CM | POA: Diagnosis not present

## 2018-03-07 DIAGNOSIS — J449 Chronic obstructive pulmonary disease, unspecified: Secondary | ICD-10-CM | POA: Diagnosis not present

## 2018-03-29 ENCOUNTER — Other Ambulatory Visit: Payer: Self-pay | Admitting: Internal Medicine

## 2018-04-18 DIAGNOSIS — J449 Chronic obstructive pulmonary disease, unspecified: Secondary | ICD-10-CM | POA: Diagnosis not present

## 2018-04-29 DIAGNOSIS — E782 Mixed hyperlipidemia: Secondary | ICD-10-CM | POA: Diagnosis not present

## 2018-04-29 DIAGNOSIS — M791 Myalgia, unspecified site: Secondary | ICD-10-CM | POA: Diagnosis not present

## 2018-04-29 DIAGNOSIS — J449 Chronic obstructive pulmonary disease, unspecified: Secondary | ICD-10-CM | POA: Diagnosis not present

## 2018-04-29 DIAGNOSIS — I10 Essential (primary) hypertension: Secondary | ICD-10-CM | POA: Diagnosis not present

## 2018-05-01 ENCOUNTER — Other Ambulatory Visit: Payer: Self-pay | Admitting: Internal Medicine

## 2018-05-06 ENCOUNTER — Other Ambulatory Visit: Payer: Self-pay | Admitting: Internal Medicine

## 2018-05-08 ENCOUNTER — Other Ambulatory Visit: Payer: Self-pay | Admitting: Internal Medicine

## 2018-05-08 NOTE — Telephone Encounter (Signed)
I agree, as last visit was July 2018

## 2018-05-19 DIAGNOSIS — J449 Chronic obstructive pulmonary disease, unspecified: Secondary | ICD-10-CM | POA: Diagnosis not present

## 2018-05-26 DIAGNOSIS — J189 Pneumonia, unspecified organism: Secondary | ICD-10-CM | POA: Diagnosis not present

## 2018-05-26 DIAGNOSIS — I10 Essential (primary) hypertension: Secondary | ICD-10-CM | POA: Diagnosis not present

## 2018-05-26 DIAGNOSIS — J449 Chronic obstructive pulmonary disease, unspecified: Secondary | ICD-10-CM | POA: Diagnosis not present

## 2018-06-18 DIAGNOSIS — J449 Chronic obstructive pulmonary disease, unspecified: Secondary | ICD-10-CM | POA: Diagnosis not present

## 2018-06-19 ENCOUNTER — Other Ambulatory Visit: Payer: Self-pay | Admitting: Internal Medicine

## 2018-06-20 ENCOUNTER — Other Ambulatory Visit: Payer: Self-pay | Admitting: Internal Medicine

## 2018-07-03 DIAGNOSIS — E782 Mixed hyperlipidemia: Secondary | ICD-10-CM | POA: Diagnosis not present

## 2018-07-03 DIAGNOSIS — Z0000 Encounter for general adult medical examination without abnormal findings: Secondary | ICD-10-CM | POA: Diagnosis not present

## 2018-07-03 DIAGNOSIS — I10 Essential (primary) hypertension: Secondary | ICD-10-CM | POA: Diagnosis not present

## 2018-07-03 DIAGNOSIS — J449 Chronic obstructive pulmonary disease, unspecified: Secondary | ICD-10-CM | POA: Diagnosis not present

## 2018-07-03 DIAGNOSIS — M79661 Pain in right lower leg: Secondary | ICD-10-CM | POA: Diagnosis not present

## 2018-07-19 DIAGNOSIS — J449 Chronic obstructive pulmonary disease, unspecified: Secondary | ICD-10-CM | POA: Diagnosis not present

## 2018-07-26 ENCOUNTER — Ambulatory Visit (HOSPITAL_COMMUNITY): Payer: Medicare Other

## 2018-07-26 ENCOUNTER — Inpatient Hospital Stay (HOSPITAL_COMMUNITY)
Admission: EM | Admit: 2018-07-26 | Discharge: 2018-07-28 | DRG: 301 | Disposition: A | Discharge status: Home Health | Payer: Medicare Other | Attending: Internal Medicine | Admitting: Internal Medicine

## 2018-07-26 ENCOUNTER — Other Ambulatory Visit: Payer: Self-pay

## 2018-07-26 ENCOUNTER — Emergency Department (HOSPITAL_COMMUNITY): Payer: Medicare Other

## 2018-07-26 ENCOUNTER — Encounter (HOSPITAL_COMMUNITY): Payer: Self-pay | Admitting: *Deleted

## 2018-07-26 DIAGNOSIS — I724 Aneurysm of artery of lower extremity: Secondary | ICD-10-CM | POA: Diagnosis not present

## 2018-07-26 DIAGNOSIS — I824Y9 Acute embolism and thrombosis of unspecified deep veins of unspecified proximal lower extremity: Secondary | ICD-10-CM | POA: Diagnosis not present

## 2018-07-26 DIAGNOSIS — M1990 Unspecified osteoarthritis, unspecified site: Secondary | ICD-10-CM | POA: Diagnosis not present

## 2018-07-26 DIAGNOSIS — Z79899 Other long term (current) drug therapy: Secondary | ICD-10-CM | POA: Diagnosis not present

## 2018-07-26 DIAGNOSIS — Z9181 History of falling: Secondary | ICD-10-CM

## 2018-07-26 DIAGNOSIS — M8580 Other specified disorders of bone density and structure, unspecified site: Secondary | ICD-10-CM | POA: Diagnosis not present

## 2018-07-26 DIAGNOSIS — M545 Low back pain: Secondary | ICD-10-CM | POA: Diagnosis present

## 2018-07-26 DIAGNOSIS — I10 Essential (primary) hypertension: Secondary | ICD-10-CM | POA: Diagnosis present

## 2018-07-26 DIAGNOSIS — Z7401 Bed confinement status: Secondary | ICD-10-CM | POA: Diagnosis not present

## 2018-07-26 DIAGNOSIS — Z993 Dependence on wheelchair: Secondary | ICD-10-CM

## 2018-07-26 DIAGNOSIS — Z882 Allergy status to sulfonamides status: Secondary | ICD-10-CM | POA: Diagnosis not present

## 2018-07-26 DIAGNOSIS — I959 Hypotension, unspecified: Secondary | ICD-10-CM | POA: Diagnosis not present

## 2018-07-26 DIAGNOSIS — Z888 Allergy status to other drugs, medicaments and biological substances status: Secondary | ICD-10-CM

## 2018-07-26 DIAGNOSIS — I82432 Acute embolism and thrombosis of left popliteal vein: Secondary | ICD-10-CM | POA: Diagnosis not present

## 2018-07-26 DIAGNOSIS — F17210 Nicotine dependence, cigarettes, uncomplicated: Secondary | ICD-10-CM | POA: Diagnosis present

## 2018-07-26 DIAGNOSIS — Z880 Allergy status to penicillin: Secondary | ICD-10-CM

## 2018-07-26 DIAGNOSIS — I723 Aneurysm of iliac artery: Secondary | ICD-10-CM | POA: Diagnosis not present

## 2018-07-26 DIAGNOSIS — E785 Hyperlipidemia, unspecified: Secondary | ICD-10-CM | POA: Diagnosis not present

## 2018-07-26 DIAGNOSIS — I82409 Acute embolism and thrombosis of unspecified deep veins of unspecified lower extremity: Secondary | ICD-10-CM | POA: Diagnosis present

## 2018-07-26 DIAGNOSIS — I82492 Acute embolism and thrombosis of other specified deep vein of left lower extremity: Secondary | ICD-10-CM | POA: Diagnosis not present

## 2018-07-26 DIAGNOSIS — I714 Abdominal aortic aneurysm, without rupture: Secondary | ICD-10-CM | POA: Diagnosis present

## 2018-07-26 DIAGNOSIS — M7989 Other specified soft tissue disorders: Secondary | ICD-10-CM | POA: Diagnosis not present

## 2018-07-26 DIAGNOSIS — I82412 Acute embolism and thrombosis of left femoral vein: Secondary | ICD-10-CM | POA: Diagnosis present

## 2018-07-26 DIAGNOSIS — I82462 Acute embolism and thrombosis of left calf muscular vein: Secondary | ICD-10-CM | POA: Diagnosis not present

## 2018-07-26 DIAGNOSIS — I82422 Acute embolism and thrombosis of left iliac vein: Secondary | ICD-10-CM | POA: Diagnosis not present

## 2018-07-26 DIAGNOSIS — I712 Thoracic aortic aneurysm, without rupture: Secondary | ICD-10-CM | POA: Diagnosis present

## 2018-07-26 DIAGNOSIS — Z79891 Long term (current) use of opiate analgesic: Secondary | ICD-10-CM | POA: Diagnosis not present

## 2018-07-26 DIAGNOSIS — M109 Gout, unspecified: Secondary | ICD-10-CM | POA: Diagnosis not present

## 2018-07-26 DIAGNOSIS — Z886 Allergy status to analgesic agent status: Secondary | ICD-10-CM

## 2018-07-26 DIAGNOSIS — J449 Chronic obstructive pulmonary disease, unspecified: Secondary | ICD-10-CM | POA: Diagnosis not present

## 2018-07-26 DIAGNOSIS — Z885 Allergy status to narcotic agent status: Secondary | ICD-10-CM

## 2018-07-26 DIAGNOSIS — G8929 Other chronic pain: Secondary | ICD-10-CM | POA: Diagnosis present

## 2018-07-26 DIAGNOSIS — R609 Edema, unspecified: Secondary | ICD-10-CM | POA: Diagnosis not present

## 2018-07-26 DIAGNOSIS — R0902 Hypoxemia: Secondary | ICD-10-CM | POA: Diagnosis not present

## 2018-07-26 DIAGNOSIS — R52 Pain, unspecified: Secondary | ICD-10-CM | POA: Diagnosis not present

## 2018-07-26 DIAGNOSIS — M2550 Pain in unspecified joint: Secondary | ICD-10-CM | POA: Diagnosis not present

## 2018-07-26 DIAGNOSIS — L8992 Pressure ulcer of unspecified site, stage 2: Secondary | ICD-10-CM | POA: Diagnosis not present

## 2018-07-26 LAB — COMPREHENSIVE METABOLIC PANEL
ALT: 11 U/L (ref 0–44)
AST: 18 U/L (ref 15–41)
Albumin: 3 g/dL — ABNORMAL LOW (ref 3.5–5.0)
Alkaline Phosphatase: 64 U/L (ref 38–126)
Anion gap: 11 (ref 5–15)
BUN: 16 mg/dL (ref 8–23)
CO2: 26 mmol/L (ref 22–32)
Calcium: 9 mg/dL (ref 8.9–10.3)
Chloride: 101 mmol/L (ref 98–111)
Creatinine, Ser: 0.98 mg/dL (ref 0.44–1.00)
GFR calc Af Amer: 58 mL/min — ABNORMAL LOW (ref >60)
GFR calc non Af Amer: 50 mL/min — ABNORMAL LOW (ref >60)
Glucose, Bld: 199 mg/dL — ABNORMAL HIGH (ref 70–99)
Potassium: 3.3 mmol/L — ABNORMAL LOW (ref 3.5–5.1)
Sodium: 138 mmol/L (ref 135–145)
Total Bilirubin: 0.6 mg/dL (ref 0.3–1.2)
Total Protein: 7.3 g/dL (ref 6.5–8.1)

## 2018-07-26 LAB — URINALYSIS, ROUTINE W REFLEX MICROSCOPIC
Bilirubin Urine: NEGATIVE
Glucose, UA: NEGATIVE mg/dL
Hgb urine dipstick: NEGATIVE
Ketones, ur: NEGATIVE mg/dL
Nitrite: NEGATIVE
Protein, ur: NEGATIVE mg/dL
Specific Gravity, Urine: 1.042 — ABNORMAL HIGH (ref 1.005–1.030)
pH: 5 (ref 5.0–8.0)

## 2018-07-26 LAB — LACTIC ACID, PLASMA
Lactic Acid, Venous: 3.3 mmol/L (ref 0.5–1.9)
Lactic Acid, Venous: 1.4 mmol/L (ref 0.5–1.9)

## 2018-07-26 LAB — CBC WITH DIFFERENTIAL/PLATELET
Abs Immature Granulocytes: 0.03 10*3/uL (ref 0.00–0.07)
Basophils Absolute: 0 10*3/uL (ref 0.0–0.1)
Basophils Relative: 1 %
Eosinophils Absolute: 0.1 10*3/uL (ref 0.0–0.5)
Eosinophils Relative: 1 %
HCT: 35.2 % — ABNORMAL LOW (ref 36.0–46.0)
Hemoglobin: 10.3 g/dL — ABNORMAL LOW (ref 12.0–15.0)
Immature Granulocytes: 1 %
Lymphocytes Relative: 18 %
Lymphs Abs: 1.2 10*3/uL (ref 0.7–4.0)
MCH: 27.9 pg (ref 26.0–34.0)
MCHC: 29.3 g/dL — ABNORMAL LOW (ref 30.0–36.0)
MCV: 95.4 fL (ref 80.0–100.0)
Monocytes Absolute: 0.6 10*3/uL (ref 0.1–1.0)
Monocytes Relative: 9 %
Neutro Abs: 4.6 10*3/uL (ref 1.7–7.7)
Neutrophils Relative %: 70 %
Platelets: 244 10*3/uL (ref 150–400)
RBC: 3.69 MIL/uL — ABNORMAL LOW (ref 3.87–5.11)
RDW: 15.2 % (ref 11.5–15.5)
WBC: 6.5 10*3/uL (ref 4.0–10.5)
nRBC: 0 % (ref 0.0–0.2)

## 2018-07-26 LAB — PROTIME-INR
INR: 1.14
Prothrombin Time: 14.5 seconds (ref 11.4–15.2)

## 2018-07-26 MED ORDER — SODIUM CHLORIDE 0.9% FLUSH
3.0000 mL | Freq: Two times a day (BID) | INTRAVENOUS | Status: DC
  Administered 2018-07-26 – 2018-07-28 (×3): 3 mL via INTRAVENOUS

## 2018-07-26 MED ORDER — ONDANSETRON HCL 4 MG/2ML IJ SOLN: 4.0000 mg | Freq: Four times a day (QID) | INTRAMUSCULAR | Status: DC | PRN

## 2018-07-26 MED ORDER — ALBUTEROL SULFATE (2.5 MG/3ML) 0.083% IN NEBU: 2.5000 mg | INHALATION_SOLUTION | RESPIRATORY_TRACT | Status: DC | PRN

## 2018-07-26 MED ORDER — IOPAMIDOL (ISOVUE-370) INJECTION 76%
100.0000 mL | Freq: Once | INTRAVENOUS | Status: AC | PRN
  Administered 2018-07-26: 100 mL via INTRAVENOUS

## 2018-07-26 MED ORDER — SODIUM CHLORIDE 0.9 % IV BOLUS
1000.0000 mL | Freq: Once | INTRAVENOUS | Status: AC
  Administered 2018-07-26: 1000 mL via INTRAVENOUS

## 2018-07-26 MED ORDER — SODIUM CHLORIDE 0.9% FLUSH: 3.0000 mL | INTRAVENOUS | Status: DC | PRN

## 2018-07-26 MED ORDER — SODIUM CHLORIDE 0.9% FLUSH: 3.0000 mL | Freq: Once | INTRAVENOUS | Status: DC

## 2018-07-26 MED ORDER — HEPARIN (PORCINE) 25000 UT/250ML-% IV SOLN
1200.0000 [IU]/h | INTRAVENOUS | Status: DC
  Administered 2018-07-26 – 2018-07-27 (×2): 1200 [IU]/h via INTRAVENOUS
  Filled 2018-07-26 (×2): qty 250

## 2018-07-26 MED ORDER — ONDANSETRON HCL 4 MG PO TABS: 4.0000 mg | ORAL_TABLET | Freq: Four times a day (QID) | ORAL | Status: DC | PRN

## 2018-07-26 MED ORDER — ACETAMINOPHEN 650 MG RE SUPP: 650.0000 mg | Freq: Four times a day (QID) | RECTAL | Status: DC | PRN

## 2018-07-26 MED ORDER — ACETAMINOPHEN 325 MG PO TABS
650.0000 mg | ORAL_TABLET | Freq: Four times a day (QID) | ORAL | Status: DC | PRN
  Administered 2018-07-27 – 2018-07-28 (×5): 650 mg via ORAL
  Filled 2018-07-26 (×5): qty 2

## 2018-07-26 MED ORDER — AMLODIPINE BESYLATE 5 MG PO TABS
5.0000 mg | ORAL_TABLET | Freq: Every day | ORAL | Status: DC
  Administered 2018-07-26 – 2018-07-28 (×3): 5 mg via ORAL
  Filled 2018-07-26 (×3): qty 1

## 2018-07-26 MED ORDER — TRIAMTERENE-HCTZ 37.5-25 MG PO TABS
0.5000 | ORAL_TABLET | Freq: Every day | ORAL | Status: DC
  Administered 2018-07-26 – 2018-07-28 (×3): 0.5 via ORAL
  Filled 2018-07-26 (×3): qty 0.5

## 2018-07-26 MED ORDER — HEPARIN BOLUS VIA INFUSION
3800.0000 [IU] | Freq: Once | INTRAVENOUS | Status: AC
  Administered 2018-07-26: 3800 [IU] via INTRAVENOUS
  Filled 2018-07-26: qty 3800

## 2018-07-26 MED ORDER — SIMVASTATIN 20 MG PO TABS
20.0000 mg | ORAL_TABLET | Freq: Every day | ORAL | Status: DC
  Administered 2018-07-26 – 2018-07-28 (×3): 20 mg via ORAL
  Filled 2018-07-26 (×3): qty 1

## 2018-07-26 MED ORDER — SODIUM CHLORIDE 0.9 % IV SOLN: 250.0000 mL | INTRAVENOUS | Status: DC | PRN

## 2018-07-26 MED ORDER — POTASSIUM CHLORIDE CRYS ER 20 MEQ PO TBCR
40.0000 meq | EXTENDED_RELEASE_TABLET | Freq: Once | ORAL | Status: AC
  Administered 2018-07-26: 40 meq via ORAL
  Filled 2018-07-26: qty 2

## 2018-07-26 MED ORDER — QUETIAPINE FUMARATE 25 MG PO TABS
25.0000 mg | ORAL_TABLET | Freq: Two times a day (BID) | ORAL | Status: DC
  Administered 2018-07-26 – 2018-07-28 (×4): 25 mg via ORAL
  Filled 2018-07-26 (×4): qty 1

## 2018-07-26 NOTE — ED Notes (Signed)
Pt provided with sandwich and water per MD.

## 2018-07-26 NOTE — Progress Notes (Signed)
ANTICOAGULATION CONSULT NOTE - Initial Consult  Pharmacy Consult for Heparin Indication: VTE Treatment  Allergies  Allergen Reactions  . Aspirin Other (See Comments)    Has diverticulitis, caused bleeding.  . Advil [Ibuprofen] Swelling    "tongue swelling"  . Atorvastatin   . Indomethacin     REACTION: gi upset  . Morphine And Related Swelling    Swelling in mouth  . Penicillins   . Sulfa Antibiotics Itching    Patient Measurements: Height: 5\' 6"  (167.6 cm) Weight: 165 lb (74.8 kg) IBW/kg (Calculated) : 59.3 Heparin Dosing Weight: 74.3  Vital Signs: Temp: 97.5 F (36.4 C) (01/22 1159) Temp Source: Oral (01/22 1159) BP: 104/71 (01/22 1430) Pulse Rate: 71 (01/22 1430)  Labs: Recent Labs    07/26/18 1218  HGB 10.3*  HCT 35.2*  PLT 244  LABPROT 14.5  INR 1.14  CREATININE 0.98    Estimated Creatinine Clearance: 37.9 mL/min (by C-G formula based on SCr of 0.98 mg/dL).   Medical History: Past Medical History:  Diagnosis Date  . Allergic rhinitis 03/27/2008   Qualifier: Diagnosis of  By: Jonny Ruiz MD, Len Blalock   . ANEMIA-NOS 06/25/2009   Qualifier: Diagnosis of  By: Ewing CMA (AAMA), Robin    . Arthritis   . Bilateral foot-drop 04/23/2015  . Chronic low back pain 04/23/2015  . Essential hypertension 02/22/2007   Qualifier: Diagnosis of  By: Briscoe Burns CMA, Alvy Beal    . GOUT 09/23/2009   Qualifier: Diagnosis of  By: Jonny Ruiz MD, Len Blalock   . Hyperlipidemia 02/22/2007   Qualifier: Diagnosis of  By: Briscoe Burns CMA, Alvy Beal    . Hypertension   . OSTEOPENIA 03/27/2008   Qualifier: Diagnosis of  By: Jonny Ruiz MD, Len Blalock   . Psychosis (HCC) 01/21/2017    Assessment: 85 yo female with an acute DVT. Pharmacy consulted for Heparin initiation.  Goal of Therapy:  Heparin level 0.3-0.7 units/ml Monitor platelets by anticoagulation protocol: Yes   Plan:  Give 3800 units bolus x 1 Start heparin infusion at 1200 units/hr Check anti-Xa level in 8 hours and daily while on heparin Continue  to monitor H&H and platelets  Jeanella Cara, PharmD, [facility] Clinical Pharmacist Please see AMION for all Pharmacists' Contact Phone Numbers 07/26/2018, 5:36 PM

## 2018-07-26 NOTE — H&P (Addendum)
HISTORY AND PHYSICAL       PATIENT DETAILS Name: Sydney Lowery Age: 85 y.o. Sex: female Date of Birth: Jul 16, 1923 Admit Date: 07/26/2018 NFA:OZHY, Len Blalock, MD   Patient coming from: Home  CHIEF COMPLAINT:  Left lower extremity swelling since this past Saturday  HPI: Sydney Lowery is a 85 y.o. female with medical history significant of hypertension, dyslipidemia presenting to the hospital for evaluation of the above-noted complaints.  Please note-no family at bedside during my evaluation-patient able to provide most of the history.  Per patient-her home health aide cut her nails on Friday-she thinks her left lower extremity was normal during then, she woke up Saturday morning to find extensive swelling of the left lower extremity.  Patient claims that the swelling has worsened over the past few days.  She denies any pain in her lower extremities to me, denies any chest pain or shortness of breath.  She at baseline is not very active-claims that for the past 1 year she can only take a few steps and is mostly wheelchair bound.  She has no prior history of DVT or PE.  No prior history of CAD or CVA.  ED Course:  The emergency room, she was found to have extensive DVT throughout the left lower extremity on Doppler ultrasound.  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  She was also found to have bilateral common iliac artery aneurysms and a right popliteal artery aneurysm. The hospitalist service was then asked to admit this patient for further evaluation and treatment.  Note: Lives at: Home Mobility: Cane/WalkerWheelchair Chronic Indwelling Foley:no   REVIEW OF SYSTEMS:  Constitutional:   No   weight loss, night sweats,  Fevers, chills, fatigue.  HEENT:    No headaches, Dysphagia  Cardio-vascular: No chest pain,Orthopnea, PND,anasarca, palpitations  GI:  No heartburn, indigestion, abdominal pain, nausea, vomiting, diarrhea, melena or hematochezia  Resp: No shortness of breath, cough, hemoptysis,plueritic chest pain.   Skin:  No rash or lesions.  GU:  No dysuria, change in color of urine, no urgency or frequency.   Musculoskeletal: No joint pain or swelling.  No decreased range of motion.  Endocrine: No heat intolerance, no cold intolerance, no polyuria, no polydipsia  Psych: No change in mood or affect.   ALLERGIES:   Allergies  Allergen Reactions  . Aspirin Other (See Comments)    Has diverticulitis, caused bleeding.  . Advil [Ibuprofen] Swelling    "tongue swelling"  . Atorvastatin   . Indomethacin     REACTION: gi upset  . Morphine And Related Swelling    Swelling in mouth  . Penicillins   . Sulfa Antibiotics Itching    PAST MEDICAL HISTORY: Past Medical History:  Diagnosis Date  . Allergic rhinitis 03/27/2008   Qualifier: Diagnosis of  By: Jonny Ruiz MD, Len Blalock   . ANEMIA-NOS 06/25/2009   Qualifier: Diagnosis of  By: Ewing CMA (AAMA), Robin    . Arthritis   . Bilateral foot-drop 04/23/2015  . Chronic low back pain 04/23/2015  . Essential hypertension 02/22/2007   Qualifier: Diagnosis of  By: Briscoe Burns CMA, Alvy Beal    . GOUT 09/23/2009   Qualifier: Diagnosis  of  By: Jonny Ruiz MD, Len Blalock   . Hyperlipidemia 02/22/2007   Qualifier: Diagnosis of  By: Briscoe Burns CMA, Alvy Beal    . Hypertension   . OSTEOPENIA 03/27/2008   Qualifier: Diagnosis of  By: Jonny Ruiz MD, Len Blalock   . Psychosis (HCC) 01/21/2017    PAST SURGICAL HISTORY: Past Surgical History:  Procedure Laterality Date  . arm fracture Left 1967   Fx. in 3 places and had a dropped wrist    MEDICATIONS AT HOME: Prior to Admission medications   Medication Sig Start Date End Date Taking? Authorizing  Provider  amLODipine (NORVASC) 5 MG tablet Take 1 tablet (5 mg total) by mouth daily. Overdue for annual appt must see provider for future refills 02/22/18  Yes Corwin Levins, MD  Cholecalciferol (VITAMIN D-3 PO) Take 1,000 Int'l Units by mouth.   Yes [provider]  QUEtiapine (SEROQUEL) 25 MG tablet Take 1 tablet (25 mg total) by mouth 2 (two) times daily. 01/31/17  Yes Corwin Levins, MD  simvastatin (ZOCOR) 20 MG tablet TAKE 1 TABLET BY MOUTH  DAILY 05/02/18  Yes Corwin Levins, MD  Specialty Vitamins Products (ECHINACEA C COMPLETE PO) Take 1 tablet by mouth daily.   Yes [provider]  traMADol (ULTRAM) 50 MG tablet TAKE 1 TABLET BY MOUTH EVERY 6 HOURS Patient taking differently: Take 50 mg by mouth every 6 (six) hours as needed for moderate pain.  01/09/18  Yes Corwin Levins, MD  triamterene-hydrochlorothiazide [facility]) 37.5-25 MG tablet TAKE ONE-HALF TABLET BY  MOUTH DAILY 06/19/18  Yes Corwin Levins, MD  Turmeric 500 MG TABS Take 500 mg by mouth daily.   Yes [provider]    FAMILY HISTORY: Family History  Problem Relation Age of Onset  . Heart Problems Mother   . Osteoarthritis Mother   . Heart Problems Father   . Osteoarthritis Father   . Heart Problems Brother   . Osteoarthritis Brother   . Cancer Other      SOCIAL HISTORY:  reports that she has been smoking cigarettes. She has been smoking about 0.50 packs per day. She uses smokeless tobacco. She reports that she does not drink alcohol or use drugs.  PHYSICAL EXAM: Blood pressure 104/71, pulse 71, temperature (!) 97.5 F (36.4 C), temperature source Oral, resp. rate 20, height 5\' 6"  (1.676 m), weight 74.8 kg, SpO2 98 %.  General appearance :Awake, alert, not in any distress.  Eyes:, pupils equally reactive to light and accomodation,no scleral icterus.Pink conjunctiva HEENT: Atraumatic and Normocephalic Neck: supple, no JVD.  Resp:Good air entry bilaterally, no added sounds  CVS: S1 S2  regular GI: Bowel sounds present, Non tender and not distended with no gaurding, rigidity or rebound. Extremities: She has significant swelling of her left lower extremity-especially involving the thigh area which is significantly swollen.  However none of her compartments appear tense-she denies any tenderness. Neurology:  speech clear,Non focal, sensation is grossly intact. Psychiatric: Normal judgment and insight. Alert and oriented x 3. Musculoskeletal:gait appears to be normal.No digital cyanosis Skin:No Rash, warm and dry Wounds:N/A  LABS ON ADMISSION:  I have personally reviewed following labs and imaging studies  CBC: Recent Labs  Lab 07/26/18 1218  WBC 6.5  NEUTROABS 4.6  HGB 10.3*  HCT 35.2*  MCV 95.4  PLT 244    Basic Metabolic Panel: Recent Labs  Lab 07/26/18 1218  NA 138  K 3.3*  CL 101  CO2 26  GLUCOSE 199*  BUN 16  CREATININE 0.98  CALCIUM 9.0    GFR: Estimated Creatinine Clearance: 37.9 mL/min (by C-G formula based on SCr of 0.98 mg/dL).  Liver Function Tests: Recent Labs  Lab 07/26/18 1218  AST 18  ALT 11  ALKPHOS 64  BILITOT 0.6  PROT 7.3  ALBUMIN 3.0*   No results for input(s): LIPASE, AMYLASE in the last 168 hours. No results for input(s): AMMONIA in the last 168 hours.  Coagulation Profile: Recent Labs  Lab 07/26/18 1218  INR 1.14    Cardiac Enzymes: No results for input(s): CKTOTAL, CKMB, CKMBINDEX, TROPONINI in the last 168 hours.  BNP (last 3 results) No results for input(s): PROBNP in the last 8760 hours.  HbA1C: No results for input(s): HGBA1C in the last 72 hours.  CBG: No results for input(s): GLUCAP in the last 168 hours.  Lipid Profile: No results for input(s): CHOL, HDL, LDLCALC, TRIG, CHOLHDL, LDLDIRECT in the last 72 hours.  Thyroid Function Tests: No results for input(s): TSH, T4TOTAL, FREET4, T3FREE, THYROIDAB in the last 72 hours.  Anemia Panel: No results for input(s): VITAMINB12, FOLATE, FERRITIN,  TIBC, IRON, RETICCTPCT in the last 72 hours.  Urine analysis:    Component Value Date/Time   COLORURINE YELLOW 07/26/2018 1626   APPEARANCEUR CLEAR 07/26/2018 1626   LABSPEC 1.042 (H) 07/26/2018 1626   PHURINE 5.0 07/26/2018 1626   GLUCOSEU NEGATIVE 07/26/2018 1626   GLUCOSEU NEGATIVE 03/04/2017 1320   HGBUR NEGATIVE 07/26/2018 1626   BILIRUBINUR NEGATIVE 07/26/2018 1626   KETONESUR NEGATIVE 07/26/2018 1626   PROTEINUR NEGATIVE 07/26/2018 1626   UROBILINOGEN 0.2 03/04/2017 1320   NITRITE NEGATIVE 07/26/2018 1626   LEUKOCYTESUR MODERATE (A) 07/26/2018 1626    Sepsis Labs: Lactic Acid, Venous    Component Value Date/Time   LATICACIDVEN 1.4 07/26/2018 1413     Microbiology: No results found for this or any previous visit (from the past 240 hour(s)).    RADIOLOGIC STUDIES ON ADMISSION: Ct Angio Aortobifemoral W And/or Wo Contrast  Result Date: 07/26/2018 CLINICAL DATA:  84 year old female with a history of leg swelling EXAM: CT ANGIOGRAPHY OF ABDOMINAL AORTA WITH ILIOFEMORAL RUNOFF TECHNIQUE: Multidetector CT imaging of the abdomen, pelvis and lower extremities was performed using the standard protocol during bolus administration of intravenous contrast. Multiplanar CT image reconstructions and MIPs were obtained to evaluate the vascular anatomy. CONTRAST:  ISOVUE-370 IOPAMIDOL (ISOVUE-370) INJECTION 76% COMPARISON:  None. FINDINGS: VASCULAR Aorta: Moderate atherosclerotic changes with aneurysmal disease of the aorta. There is incidental imaging of a saccular thoracic aortic aneurysm at the T9-T10 level, projecting towards the right. The greatest diameter of the aortic lumen to the dome of the aneurysm on the coronal images is 8.2 cm. Axial images measures 8.5 cm, likely slightly over estimating the diameter given the angulation of the aorta. The saccular aneurysm is nearly completely thrombosed. Diameter of the thoracic aorta at the hiatus measures 4.6 cm. A second saccular  aneurysm of the infrarenal abdominal aorta is present diameter of the dome to contralateral aortic wall on the axial images measures 7.5 cm. The lumen of this saccular aneurysm is nearly completely thrombosed, with minimal residual internal flow. The most cephalad aspect of the aneurysm sac is continuous with the right renal artery. The native aortic diameter at the left renal artery measures 2.4 cm. Diameter of the infrarenal abdominal aorta at the bifurcation measures 2.3 cm. No periaortic fluid or inflammatory changes.  No edema. Celiac: Atherosclerotic changes at the celiac artery origin. There is a beaded appearance of  the celiac artery beyond the origin without inflammatory changes or dissection. Branch vessels are patent with typical branching pattern. SMA: Atherosclerotic changes at the SMA origin without significant stenosis. Branches are patent. Renals: The right renal artery demonstrates mild atherosclerotic changes at the origin. The artery is inseparable from the superior aspect of the aneurysm sac. Single right renal artery is visualized. Left renal artery demonstrates no significant atherosclerotic changes at the origin. Single left renal artery is identified. IMA: IMA remains patent with atherosclerotic changes at the origin. Right lower extremity: Mixed calcified and soft plaque of the right iliac system. Diameter of the common iliac artery measures at least 1.9 cm. Hypogastric artery is patent. External iliac artery is patent. Mild atherosclerotic changes of the common femoral artery. Profunda femoris and thigh branches are patent. Moderate calcified plaque of the length of the right superficial femoral artery. There is significant deterioration of the contrast bolus of the right superficial femoral artery within the abductor canal, extending through the popliteal artery and into the tibial arteries. This is asymmetric when compared to the left. Popliteal artery aneurysm is present measuring 2.2  cm. Mild tibial arterial calcifications. Left lower extremity: Mixed calcified and soft plaque of the left iliac system. No dissection. Diameter of the left common iliac artery measures 1.9 cm. Hypogastric artery remains patent. External iliac artery with mild atherosclerotic changes, patent. Common femoral artery is patent with mild atherosclerotic changes. Profunda femoris and thigh branches are patent. Moderate atherosclerotic calcifications of the left superficial femoral artery without CT evidence of high-grade stenosis. The contrast bolus density is maintained to the popliteal artery, which is asymmetric compared to the right. No popliteal artery aneurysm identified. No significant opacification of the left tibial arteries which demonstrate mild calcified disease. Veins: Unremarkable appearance of the IVC. The left common iliac vein is significantly compressed under the left iliac artery, in a variant May-Thurner configuration. Hazy infiltration of the fat surrounding the left iliac vein, left common femoral vein, left femoral vein through the length of the thigh, with asymmetric size and density compared to the right. Associated left thigh and leg swelling. Unremarkable appearance of the unopacified right-sided venous system. Review of the MIP images confirms the above findings. NON-VASCULAR Lower chest: Respiratory motion somewhat limits evaluation of the lower chest. Emphysema is present. No acute lower chest finding. Hepatobiliary: Arterial phase demonstrates hyperenhancing lesion in the left lobe, segment 3 measuring 10 mm, likely a perfusion anomaly. Unremarkable gallbladder Pancreas: Unremarkable pancreas Spleen: Unremarkable spleen Adrenals/Urinary Tract: Unremarkable appearance of adrenal glands. Right: No right-sided hydronephrosis. Nonobstructive stone at the lower pole collecting system measures 4 mm. Cyst at the upper pole measures fluid density and cystic structure at the lower pole measures  fluid density, both of which are consistent with Bosniak 1 cyst. There are additional small lesions which are too small to characterize. Left: No evidence of hydronephrosis. No left-sided nephrolithiasis. Symmetric perfusion to the right. Small rounded lesions are too small to characterize. Unremarkable appearance the bilateral ureters. Unremarkable urinary bladder. Stomach/Bowel: Unremarkable appearance of the stomach. Unremarkable appearance of small bowel. No evidence of obstruction. Normal appendix colonic diverticula without associated inflammatory changes. Lymphatic: No lymphadenopathy Mesenteric: No free fluid or air. No adenopathy. Reproductive: Unremarkable uterus Other: Small fat containing umbilical hernia Musculoskeletal: No acute displaced fracture. Focal medullary lesion in the proximal left femur, most likely enchondroma. No lytic lesions of the visualized skeletal elements. Degenerative changes of the spine. Extensive left leg swelling involving thigh and lower leg IMPRESSION: While  there is no direct evidence of a left femoral DVT, this is the suspected diagnosis, as there is edema surrounding left iliac vein, left common femoral vein, left femoral vein, and what appears to be asymmetric density and size of the vein compared to the contralateral. There is also significant left lower extremity swelling. Correlation with forthcoming duplex is recommended. 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 Significant aortic disease: A-Saccular aneurysm of the thoracic aorta estimated 8.2 cm, with near complete thrombosis of the exophytic sac. Aortic aneurysm NOS (ICD10-I71.9). B-saccular aneurysm of the infrarenal abdominal aorta estimated 7.5 cm, with near complete thrombosis of the exophytic sac. C-diameter of aorta at the hiatus measures 4.6 cm Referral for vascular evaluation recommended. These above results were called by  telephone at the time of interpretation on 07/26/2018 at 5:02 pm to nurse caring for the patient, Ms Meriam Sprague. 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 Multilevel atherosclerotic disease: -Aortic atherosclerosis Aortic Atherosclerosis (ICD10-I70.0). -mild-to-moderate iliac arterial disease bilaterally -moderate bilateral femoropopliteal atherosclerosis -there is absence of contrast within the right SFA and popliteal artery, which is asymmetric compared to the left. This may be related to a thrombosed popliteal artery aneurysm, versus late arrival of the contrast secondary to arterial occlusive disease. Tibial arteries not evaluated. Correlation with noninvasive may be useful. -there is poor contrast of the left popliteal artery, with no opacification of the tibial arteries. Correlation with noninvasive may be useful. Hyperenhancing focus of the left liver is favored to represent benign vascular anomaly Diverticular disease Right nephrolithiasis Signed, Yvone Neu. Reyne Dumas, RPVI [facility] Radiology Electronically Signed   By: Gilmer Mor D.O.   On: 07/26/2018 17:03   Vas [place] Lower Extremity Venous (dvt) (only [facility])  Result Date: 07/26/2018  Lower Venous Study Indications: Swelling.  Limitations: Poor ultrasound/tissue interface. Performing Technologist: Chanda Busing RVT  Examination Guidelines: A complete evaluation includes B-mode imaging, spectral Doppler, color Doppler, and power Doppler as needed of all accessible portions of each vessel. Bilateral testing is considered an integral part of a complete examination. Limited examinations for reoccurring indications may be performed as noted.  Right Venous Findings: +---+---------------+---------+-----------+----------+-------+    CompressibilityPhasicitySpontaneityPropertiesSummary  +---+---------------+---------+-----------+----------+-------+ CFVFull           Yes      Yes                          +---+---------------+---------+-----------+----------+-------+  Left Venous Findings: +---------------+---------------+---------+-----------+----------+-------------+                CompressibilityPhasicitySpontaneityPropertiesSummary       +---------------+---------------+---------+-----------+----------+-------------+ CFV            None           No       No                                 +---------------+---------------+---------+-----------+----------+-------------+ SFJ            Full                                                       +---------------+---------------+---------+-----------+----------+-------------+  FV Prox        None           No       No                                 +---------------+---------------+---------+-----------+----------+-------------+ FV Mid         None                                                       +---------------+---------------+---------+-----------+----------+-------------+ FV Distal      None                                                       +---------------+---------------+---------+-----------+----------+-------------+ PFV            None           No       No                                 +---------------+---------------+---------+-----------+----------+-------------+ POP            Partial        No       No                                 +---------------+---------------+---------+-----------+----------+-------------+ PTV            Full                                                       +---------------+---------------+---------+-----------+----------+-------------+ PERO           Full                                                       +---------------+---------------+---------+-----------+----------+-------------+ Soleal         None                                                        +---------------+---------------+---------+-----------+----------+-------------+ Gastroc        None                                                       +---------------+---------------+---------+-----------+----------+-------------+ External iliac None           No  No                                 vein                                                                      +---------------+---------------+---------+-----------+----------+-------------+ Common iliac                                                Not           vein                                                        visualized    +---------------+---------------+---------+-----------+----------+-------------+    Summary: Right: No evidence of common femoral vein obstruction. Left: Findings consistent with acute deep vein thrombosis involving the external iliac vein, left common femoral vein, left femoral vein, left proximal profunda vein, left popliteal vein, left gastrocnemius vein, and left soleal vein. No cystic structure  found in the popliteal fossa. Unable to visualize the common iliac vein due to bowel gas.  *See table(s) above for measurements and observations.    Preliminary     EKG:  Not done  ASSESSMENT AND PLAN: Extensive DVT involving the left lower extremity involving the external iliac vein, left common femoral vein, left femoral vein, left proximal profunda vein, left popliteal vein, left gastrocnemius vein, and left soleal vein: Start IV heparin-spoke with Dr. Florene Route surgery-he will evaluate tomorrow-we will keep n.p.o. post midnight.  CT angiogram suggest May Thurner configuration-but suspect DVT is likely provoked secondary to her sedentary/minimal ambulatory lifestyle.  Denies any recent falls-no family at bedside-but given extensive DVT-and no obvious contraindication-benefits of anticoagulation outweigh any risks at this time.   Will need to discuss with family-patient-regarding long term anticoagulation-especially in light of advanced age, frailty, and thoracic and abdominal aortic aneurysms.  Will await further recommendation from vascular surgery.  8.2 cm saccular aneurysm of the thoracic aorta with near complete thrombosis of the exophytic sac, saccular aneurysm of the infrarenal abdominal aorta measuring 7.5 cm with near complete thrombosis of the exophytic sac, 1.9 cm bilateral common iliac artery aneurysm, 2.1 cm right popliteal artery aneurysm: We will await further recommendations from vascular surgery-obviously advanced age/frailty be factors that will need to be considered.  Hypertension: Continue amlodipine and Maxzide-follow BP trend and adjust accordingly  Dyslipidemia: Continue statin  Further plan will depend as patient's clinical course evolves and further radiologic and laboratory data become available. Patient will be monitored closely.  Above noted plan was discussed with patient face to face at bedside, they were in agreement.   CONSULTS: None  DVT Prophylaxis: IV heparin  Code Status: Full Code  Disposition Plan:  Discharge back home vs SNF possibly in 2-3 days, depending on clinical course  Admission status: Inpatient going to tele  The medical decision making on this  patient was of high complexity and the patient is at high risk for clinical deterioration, therefore this is a level 3 visit.   Total time spent  55 minutes.Greater than 50% of this time was spent in counseling, explanation of diagnosis, planning of further management, and coordination of care.  Severity of illness: The appropriate patient status for this patient is INPATIENT. Inpatient status is judged to be reasonable and necessary in order to provide the required intensity of service to ensure the patient's safety. The patient's presenting symptoms, physical exam findings, and initial radiographic and laboratory data in  the context of their chronic comorbidities is felt to place them at high risk for further clinical deterioration. Furthermore, it is not anticipated that the patient will be medically stable for discharge from the hospital within 2 midnights of admission. The following factors support the patient status of inpatient.   " The patient's presenting symptoms include extensive swelling of the left lower extremity " The worrisome physical exam findings include extensive swelling of the left lower extremity " The initial radiographic and laboratory data are worrisome because of extensive left lower extremity DVT " The chronic co-morbidities include hypertension, dyslipidemia, advanced age   * I certify that at the point of admission it is my clinical judgment that the patient will require inpatient hospital care spanning beyond 2 midnights from the point of admission due to high intensity of service, high risk for further deterioration and high frequency of surveillance required.  Jeoffrey Massed [facility] Hospitalists Pager [phone]  If 7PM-7AM, please contact night-coverage [email or web address] Password [facility] 07/26/2018, 5:45 PM

## 2018-07-26 NOTE — ED Notes (Signed)
CRITICAL LAB LACTIC 3.3 REPORTED TO MD

## 2018-07-26 NOTE — ED Notes (Signed)
Patient transported to CT 

## 2018-07-26 NOTE — ED Notes (Signed)
Patient returned from Ultrasound. 

## 2018-07-26 NOTE — ED Notes (Signed)
Report attempted 

## 2018-07-26 NOTE — ED Provider Notes (Signed)
Care assumed from Dr. Erma Heritage at shift change.  Patient presents here with swelling in the left lower leg.  Work-up was initiated including ultrasound which shows extensive DVT throughout the left thigh and popliteal veins.  Patient awaiting results of a CT Angio of the chest, abdomen, pelvis, and lower extremity.  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  These findings were discussed with Dr. early from vascular surgery.  He is recommending leg elevation and anticoagulation.  I entered the room to discuss the results with the patient and her daughter.  I introduced myself and explained that I was relieving Dr. Erma Heritage and was going to go over the results of her tests.  The patient's daughter at bedside began making derogatory remarks and became quite irrational about Dr. Erma Heritage leaving without giving her mother blood thinners.  I then explained the results of the tests with the patient.  I also explained to them that I discussed these findings with the vascular surgeon on call.  I explained our plan of initiating blood thinning medications.  The next issue I began to discuss was that of the disposition.  I started to explain to the patient and daughter that either discharge with Eliquis or admission with heparin would be appropriate.  Before I could explain to her that admission and heparinization was an option, the daughter became extremely irate telling me that trying to send her home was "ridiculous".  She made comments stating that she "understood that this was no longer a healthcare facility and just a business, but sending her mother home with a clot that could kill her was ridiculous". I then told them I would call the admitting doctor and have her admitted to the hospital.  Several minutes later as I was entering another patient's room, the angry daughter flagged me down and asked who the admitting doctor was.  I explained to her I was not sure as they had not called me back as of  yet.  She then wanted to know who was in charge of the entire hospital.  When I told her I was unsure of what she was asking me, she slammed her purse on the counter and stated that she would "figure it out herself".   Geoffery Lyons, MD 07/26/18 1745

## 2018-07-26 NOTE — ED Triage Notes (Signed)
Presents to ed via [facility] c/o left leg swelling onset Sat. Denies injury. Able to doppler a pulse

## 2018-07-26 NOTE — ED Provider Notes (Addendum)
[facility] EMERGENCY DEPARTMENT Provider Note   CSN: [id number] Arrival date & time: 07/26/18  1159     History   Chief Complaint Chief Complaint  Patient presents with  . Leg Swelling    HPI Sydney Lowery is a 85 y.o. female.  HPI   85 yo F with PMHx as below here with left leg pain and swelling. Pt reports that over the past several days, she's noticed significant increase in LLE swelling.  She denies any preceding trauma.  She does note that she is not very active.  She states that over the last 24 hours, she said progressive worsening swelling, and now a diffuse, aching, throbbing, pain that is worse when she attempts to walk or move it.  She said some diffuse warmth in the leg as well.  Denies any associated chest pain or shortness of breath.  No joint redness or warmth.  Denies any preceding trauma.  Pain is worse with weightbearing as mentioned.  No alleviating factors.  Denies history of DVT or PE.  Past Medical History:  Diagnosis Date  . Allergic rhinitis 03/27/2008   Qualifier: Diagnosis of  By: Jonny Ruiz MD, Len Blalock   . ANEMIA-NOS 06/25/2009   Qualifier: Diagnosis of  By: Ewing CMA (AAMA), Robin    . Arthritis   . Bilateral foot-drop 04/23/2015  . Chronic low back pain 04/23/2015  . Essential hypertension 02/22/2007   Qualifier: Diagnosis of  By: Briscoe Burns CMA, Alvy Beal    . GOUT 09/23/2009   Qualifier: Diagnosis of  By: Jonny Ruiz MD, Len Blalock   . Hyperlipidemia 02/22/2007   Qualifier: Diagnosis of  By: Briscoe Burns CMA, Alvy Beal    . Hypertension   . OSTEOPENIA 03/27/2008   Qualifier: Diagnosis of  By: Jonny Ruiz MD, Len Blalock   . Psychosis (HCC) 01/21/2017    Patient Active Problem List   Diagnosis Date Noted  . Psychosis (HCC) 01/21/2017  . Right knee pain 09/15/2016  . Left ear hearing loss 09/15/2016  . Abdominal pain 08/20/2015  . Encounter for well adult exam with abnormal findings 04/23/2015  . Bilateral foot-drop 04/23/2015  . Chronic low back pain 04/23/2015  .  GOUT 09/23/2009  . Acute gouty arthropathy 06/25/2009  . ANEMIA-NOS 06/25/2009  . Pain in joint, ankle and foot 06/25/2009  . Allergic rhinitis 03/27/2008  . DIVERTICULOSIS, COLON 03/27/2008  . OSTEOPENIA 03/27/2008  . FREQUENCY, URINARY 03/26/2008  . Hyperlipidemia 02/22/2007  . Essential hypertension 02/22/2007    Past Surgical History:  Procedure Laterality Date  . arm fracture Left 1967   Fx. in 3 places and had a dropped wrist     OB History   No obstetric history on file.      Home Medications    Prior to Admission medications   Medication Sig Start Date End Date Taking? Authorizing Provider  amLODipine (NORVASC) 5 MG tablet Take 1 tablet (5 mg total) by mouth daily. Overdue for annual appt must see provider for future refills 02/22/18   Corwin Levins, MD  amLODipine (NORVASC) 5 MG tablet TAKE 1 TABLET BY MOUTH  DAILY 06/20/18   Corwin Levins, MD  Cholecalciferol (VITAMIN D-3 PO) Take 1,000 Int'l Units by mouth.    [provider]  colchicine 0.6 MG tablet Take 1 tablet (0.6 mg total) by mouth daily. 04/21/17   Corwin Levins, MD  nitrofurantoin, macrocrystal-monohydrate, (MACROBID) 100 MG capsule Take 1 capsule (100 mg total) by mouth 2 (two) times daily. 03/04/17  Corwin LevinsJohn, James W, MD  potassium chloride (K-DUR) 10 MEQ tablet Take 1 tablet (10 mEq total) by mouth daily. 02/09/17   Corwin LevinsJohn, James W, MD  predniSONE (DELTASONE) 10 MG tablet 3 tabs by mouth per day for 3 days,2tabs per day for 3 days,1tab per day for 3 days 03/31/17   Corwin LevinsJohn, James W, MD  QUEtiapine (SEROQUEL) 25 MG tablet Take 1 tablet (25 mg total) by mouth 2 (two) times daily. 01/31/17   Corwin LevinsJohn, James W, MD  simvastatin (ZOCOR) 20 MG tablet TAKE 1 TABLET BY MOUTH  DAILY 05/02/18   Corwin LevinsJohn, James W, MD  traMADol Janean Sark(ULTRAM) 50 MG tablet TAKE 1 TABLET BY MOUTH EVERY 6 HOURS 01/09/18   Corwin LevinsJohn, James W, MD  triamterene-hydrochlorothiazide [facility](MAXZIDE-25) 37.5-25 MG tablet TAKE ONE-HALF TABLET BY  MOUTH DAILY 06/19/18   Corwin LevinsJohn,  James W, MD    Family History Family History  Problem Relation Age of Onset  . Heart Problems Mother   . Osteoarthritis Mother   . Heart Problems Father   . Osteoarthritis Father   . Heart Problems Brother   . Osteoarthritis Brother   . Cancer Other     Social History Social History   Tobacco Use  . Smoking status: Current Every Day Smoker    Packs/day: 0.50    Types: Cigarettes  . Smokeless tobacco: Current User  Substance Use Topics  . Alcohol use: No  . Drug use: No     Allergies   Aspirin; Advil [ibuprofen]; Atorvastatin; Indomethacin; Morphine and related; Penicillins; and Sulfa antibiotics   Review of Systems Review of Systems  Constitutional: Negative for chills and fever.  HENT: Negative for congestion, rhinorrhea and sore throat.   Eyes: Negative for visual disturbance.  Respiratory: Negative for cough, shortness of breath and wheezing.   Cardiovascular: Positive for leg swelling. Negative for chest pain.  Gastrointestinal: Negative for abdominal pain, diarrhea, nausea and vomiting.  Genitourinary: Negative for dysuria, flank pain, vaginal bleeding and vaginal discharge.  Musculoskeletal: Negative for neck pain.  Skin: Negative for rash.  Allergic/Immunologic: Negative for immunocompromised state.  Neurological: Negative for syncope and headaches.  Hematological: Does not bruise/bleed easily.  All other systems reviewed and are negative.    Physical Exam Updated Vital Signs BP 113/67 (BP Location: Right Arm)   Pulse 90   Temp (!) 97.5 F (36.4 C) (Oral)   Resp 20   Ht 5\' 6"  (1.676 m)   Wt 74.8 kg   SpO2 98%   BMI 26.63 kg/m   Physical Exam Vitals signs and nursing note reviewed.  Constitutional:      General: She is not in acute distress.    Appearance: She is well-developed.  HENT:     Head: Normocephalic and atraumatic.  Eyes:     Conjunctiva/sclera: Conjunctivae normal.  Neck:     Musculoskeletal: Neck supple.  Cardiovascular:      Rate and Rhythm: Normal rate and regular rhythm.     Heart sounds: Normal heart sounds. No murmur. No friction rub.  Pulmonary:     Effort: Pulmonary effort is normal. No respiratory distress.     Breath sounds: Normal breath sounds. No wheezing or rales.  Abdominal:     General: There is no distension.     Palpations: Abdomen is soft.     Tenderness: There is no abdominal tenderness.  Skin:    General: Skin is warm.     Capillary Refill: Capillary refill takes less than 2 seconds.  Neurological:  Mental Status: She is alert and oriented to person, place, and time.     Motor: No abnormal muscle tone.     LOWER EXTREMITY EXAM: LEFT  INSPECTION & PALPATION: Marked asymmetric 3+ pitting edema of LLE diffusely, with diffuse warmth. No appreciable ankle, knee or other joint swelling.   SENSORY: sensation is intact to light touch in:  Superficial peroneal nerve distribution (over dorsum of foot) Deep peroneal nerve distribution (over first dorsal web space) Sural nerve distribution (over lateral aspect 5th metatarsal) Saphenous nerve distribution (over medial instep)  MOTOR:  + Motor EHL (great toe dorsiflexion) + FHL (great toe plantar flexion)  + TA (ankle dorsiflexion)  + GSC (ankle plantar flexion)  VASCULAR: 2+ dorsalis pedis and posterior tibialis pulses Capillary refill < 2 sec, toes warm and well-perfused  COMPARTMENTS: Soft, warm, well-perfused No pain with passive extension No parethesias   ED Treatments / Results  Labs (all labs ordered are listed, but only abnormal results are displayed) Labs Reviewed  COMPREHENSIVE METABOLIC PANEL - Abnormal; Notable for the following components:      Result Value   Potassium 3.3 (*)    Glucose, Bld 199 (*)    Albumin 3.0 (*)    GFR calc non Af Amer 50 (*)    GFR calc Af Amer 58 (*)    All other components within normal limits  LACTIC ACID, PLASMA - Abnormal; Notable for the following components:   Lactic Acid,  Venous 3.3 (*)    All other components within normal limits  CBC WITH DIFFERENTIAL/PLATELET - Abnormal; Notable for the following components:   RBC 3.69 (*)    Hemoglobin 10.3 (*)    HCT 35.2 (*)    MCHC 29.3 (*)    All other components within normal limits  PROTIME-INR  LACTIC ACID, PLASMA  URINALYSIS, ROUTINE W REFLEX MICROSCOPIC    EKG None  Radiology No results found.  Procedures Procedures (including critical care time)  Medications Ordered in ED Medications  sodium chloride flush (NS) 0.9 % injection 3 mL (has no administration in time range)     Initial Impression / Assessment and Plan / ED Course  I have reviewed the triage vital signs and the nursing notes.  Pertinent labs & imaging results that were available during my care of the patient were reviewed by me and considered in my medical decision making (see chart for details).     85 yo M with PMHx as above here with atraumatic left leg pain. Pt noted to have lactic acid of 3.3 on arrival. Give atraumatic leg pain with diminished pulse, CT angio, DVT study obtained and shows extensive DVT of LLE.  Ultrasound shows extensive DVT involving the common femoral and external iliac at the least.  CT angios is pending, but does show what appears to be a large saccular aneurysm of the infrarenal aorta.  Given her age and extent of DVT, suspect will need to be admitted for anticoagulation.  This was discussed with pt and daughter. While she needs anticoagulation certainly for her DVT, will wait for formal read of CT Angio to rule out any underlying dissection or bleeding prior to starting anticoagulation given her age. This was discussed with pt and daughter. Daughter was initially demanding that we start heparin immediately but risks of starting heparin without formal CT read given the degree of aneurysmal dilatation with mural thrombus and consideration of dissection was discussed with her, at which time she initially expressed  understanding. Feel that waiting  for CT results - which has already been completing, and I confirmed is being read by Radiologist with [facility] at this time -is reasonable given the risks of anticoagulation in this pt with atypical pain, at rest, and elevated lactic acid c/f dissection or arterial injury.  Dr. Judd Lienelo will f/u Ct results, plan to discuss with Vascular and admit. Pt remains NVI distally without signs of phlegmasia.  Final Clinical Impressions(s) / ED Diagnoses   Final diagnoses:  Acute deep vein thrombosis (DVT) of femoral vein of left lower extremity [facility](HCC)    ED Discharge Orders    None       Shaune PollackIsaacs, Cameron, MD 07/26/18 Ali [id number]    Isaacs, Cameron, MD 07/26/18 [place] [id number]    Shaune PollackIsaacs, Cameron, MD 07/26/18 [phone]

## 2018-07-26 NOTE — ED Notes (Signed)
Pt back from CT

## 2018-07-26 NOTE — ED Notes (Signed)
Per dtr pt is undercare of Dr. Radene Journey [phone] and Lomas RN [phone].

## 2018-07-26 NOTE — Progress Notes (Signed)
  Pt orientation to unit, room and routine. Information packet given to patient/family and safety video watched.  Admission INP armband ID verified with patient/family, and in place. SR up x 2, fall risk assessment complete with Patient and family verbalizing understanding of risks associated with falls. Pt verbalizes an understanding of how to use the call bell and to call for help before getting out of bed.  Skin, clean-dry- intact without evidence of bruising, or skin tears.   No evidence of skin break down noted on exam. Skin assessment verify  and reported with the night shift nurse.

## 2018-07-26 NOTE — Progress Notes (Signed)
Left lower extremity venous duplex has been completed. There is evidence of acute deep vein thrombosis involving the external iliac, common femoral, femoral, profunda femoral, popliteal, intramuscular gastrocnemius, and soleal veins of the left lower extremity. Results were given to Dr. Erma Heritage.  07/26/18 4:07 PM Olen Cordial RVT

## 2018-07-26 NOTE — ED Notes (Signed)
Patient transported to Ultrasound 

## 2018-07-27 DIAGNOSIS — I712 Thoracic aortic aneurysm, without rupture: Secondary | ICD-10-CM

## 2018-07-27 DIAGNOSIS — I724 Aneurysm of artery of lower extremity: Secondary | ICD-10-CM

## 2018-07-27 DIAGNOSIS — I714 Abdominal aortic aneurysm, without rupture: Secondary | ICD-10-CM

## 2018-07-27 LAB — BASIC METABOLIC PANEL
Anion gap: 9 (ref 5–15)
BUN: 16 mg/dL (ref 8–23)
CO2: 26 mmol/L (ref 22–32)
Calcium: 8.6 mg/dL — ABNORMAL LOW (ref 8.9–10.3)
Chloride: 104 mmol/L (ref 98–111)
Creatinine, Ser: 0.89 mg/dL (ref 0.44–1.00)
GFR calc Af Amer: >60 mL/min (ref >60)
GFR calc non Af Amer: 56 mL/min — ABNORMAL LOW (ref >60)
Glucose, Bld: 86 mg/dL (ref 70–99)
Potassium: 3.9 mmol/L (ref 3.5–5.1)
Sodium: 139 mmol/L (ref 135–145)

## 2018-07-27 LAB — CBC
HCT: 30.4 % — ABNORMAL LOW (ref 36.0–46.0)
Hemoglobin: 9.4 g/dL — ABNORMAL LOW (ref 12.0–15.0)
MCH: 28.3 pg (ref 26.0–34.0)
MCHC: 30.9 g/dL (ref 30.0–36.0)
MCV: 91.6 fL (ref 80.0–100.0)
Platelets: 216 10*3/uL (ref 150–400)
RBC: 3.32 MIL/uL — ABNORMAL LOW (ref 3.87–5.11)
RDW: 15.3 % (ref 11.5–15.5)
WBC: 6.6 10*3/uL (ref 4.0–10.5)
nRBC: 0 % (ref 0.0–0.2)

## 2018-07-27 LAB — HEPARIN LEVEL (UNFRACTIONATED)
Heparin Unfractionated: 0.66 IU/mL (ref 0.30–0.70)
Heparin Unfractionated: 0.82 IU/mL — ABNORMAL HIGH (ref 0.30–0.70)

## 2018-07-27 MED ORDER — APIXABAN 5 MG PO TABS: 5.0000 mg | ORAL_TABLET | Freq: Two times a day (BID) | ORAL | Status: DC

## 2018-07-27 MED ORDER — APIXABAN 5 MG PO TABS
10.0000 mg | ORAL_TABLET | Freq: Two times a day (BID) | ORAL | Status: DC
  Administered 2018-07-27 – 2018-07-28 (×2): 10 mg via ORAL
  Filled 2018-07-27 (×2): qty 2

## 2018-07-27 MED ORDER — APIXABAN 5 MG PO TABS
10.0000 mg | ORAL_TABLET | ORAL | Status: AC
  Administered 2018-07-27: 10 mg via ORAL
  Filled 2018-07-27: qty 2

## 2018-07-27 NOTE — Progress Notes (Signed)
PT Cancellation Note  Patient Details Name: KERRI-ANN WILDES MRN: [id number] DOB: October 23, 1923   Cancelled Treatment:    Reason Eval/Treat Not Completed: Fatigue/lethargy limiting ability to participate(Chart reviewed, RN consulted. per Pharmacy note, pt within anticoagulation goal, and most recent INR within safe range. Family in room reports pt too fatigued at this time to participate, asks to return later. ) PT to attempt evaluation again at later date/time as schedule permits.   10:23 AM, 07/27/18 Rosamaria Lints, PT, DPT Physical Therapist - [place] [phone] (Pager)  [phone] (Office)    Buccola,Allan C 07/27/2018, 10:23 AM

## 2018-07-27 NOTE — Consult Note (Signed)
Vascular and Vein Specialist of [facility]  Patient name: Sydney Lowery MRN: [id number] DOB: 07-29-1923 Sex: female  REASON FOR CONSULT: Thoracic, abdominal and right popliteal aneurysm and extensive left leg DVT  HPI: Sydney Lowery is a 85 y.o. female, who is seen for discussion of incidental finding of multiple aneurysms.  She is a 85 year old who presented with marked swelling in her entire left leg.  Duplex revealed extensive clot in her external iliac femoral and popliteal veins.  She underwent CT scan while in the emergency department of her abdomen and pelvis and runoff.  This revealed incidental finding of a saccular aneurysm just above her diaphragm in her thoracic aorta and also a saccular aneurysm in her infrarenal aorta at the level of the renal arteries and also a right popliteal artery aneurysm.  She had no prior knowledge of these.  Her daughter is present for discussion with this as well.  There is some suggestion of compression of her left iliac vein due to tortuosity and ectasia of her left iliac artery.  She reports that she transfers from bed to chair and is mostly in a wheelchair due to overall weakness.  Of note she worked at the [employer] for 38 years prior to retiring 20 years ago  Past Medical History:  Diagnosis Date  . Allergic rhinitis 03/27/2008   Qualifier: Diagnosis of  By: Jonny Ruiz MD, Len Blalock   . ANEMIA-NOS 06/25/2009   Qualifier: Diagnosis of  By: Ewing CMA (AAMA), Robin    . Arthritis   . Bilateral foot-drop 04/23/2015  . Chronic low back pain 04/23/2015  . Essential hypertension 02/22/2007   Qualifier: Diagnosis of  By: Briscoe Burns CMA, Alvy Beal    . GOUT 09/23/2009   Qualifier: Diagnosis of  By: Jonny Ruiz MD, Len Blalock   . Hyperlipidemia 02/22/2007   Qualifier: Diagnosis of  By: Briscoe Burns CMA, Alvy Beal    . Hypertension   . OSTEOPENIA 03/27/2008   Qualifier: Diagnosis of  By: Jonny Ruiz MD, Len Blalock   . Psychosis [facility]) 01/21/2017     Family History  Problem Relation Age of Onset  . Heart Problems Mother   . Osteoarthritis Mother   . Heart Problems Father   . Osteoarthritis Father   . Heart Problems Brother   . Osteoarthritis Brother   . Cancer Other     SOCIAL HISTORY: Social History   Socioeconomic History  . Marital status: Widowed    Spouse name: Not on file  . Number of children: Not on file  . Years of education: Not on file  . Highest education level: Not on file  Occupational History  . Not on file  Social Needs  . Financial resource strain: Not on file  . Food insecurity:    Worry: Not on file    Inability: Not on file  . Transportation needs:    Medical: Not on file    Non-medical: Not on file  Tobacco Use  . Smoking status: Current Every Day Smoker    Packs/day: 0.50    Types: Cigarettes  . Smokeless tobacco: Current User  Substance and Sexual Activity  . Alcohol use: No  . Drug use: No  . Sexual activity: Not on file  Lifestyle  . Physical activity:    Days per week: Not on file    Minutes per session: Not on file  . Stress: Not on file  Relationships  . Social connections:    Talks on phone: Not on file  Gets together: Not on file    Attends religious service: Not on file    Active member of club or organization: Not on file    Attends meetings of clubs or organizations: Not on file    Relationship status: Not on file  . Intimate partner violence:    Fear of current or ex partner: Not on file    Emotionally abused: Not on file    Physically abused: Not on file    Forced sexual activity: Not on file  Other Topics Concern  . Not on file  Social History Narrative  . Not on file    Allergies  Allergen Reactions  . Aspirin Other (See Comments)    Has diverticulitis, caused bleeding.  . Advil [Ibuprofen] Swelling    "tongue swelling"  . Atorvastatin   . Indomethacin     REACTION: gi upset  . Morphine And Related Swelling    Swelling in mouth  . Penicillins    . Sulfa Antibiotics Itching    Current Facility-Administered Medications  Medication Dose Route Frequency Provider Last Rate Last Dose  . 0.9 %  sodium chloride infusion  250 mL Intravenous PRN Maretta Bees, MD      . acetaminophen (TYLENOL) tablet 650 mg  650 mg Oral Q6H PRN Ghimire, Werner Lean, MD       Or  . acetaminophen (TYLENOL) suppository 650 mg  650 mg Rectal Q6H PRN Ghimire, Werner Lean, MD      . albuterol (PROVENTIL) (2.5 MG/3ML) 0.083% nebulizer solution 2.5 mg  2.5 mg Nebulization Q2H PRN Ghimire, Werner Lean, MD      . amLODipine (NORVASC) tablet 5 mg  5 mg Oral Daily Maretta Bees, MD   5 mg at 07/27/18 0923  . heparin ADULT infusion 100 units/mL (25000 units/222mL sodium chloride 0.45%)  1,200 Units/hr Intravenous Continuous Maretta Bees, MD 12 mL/hr at 07/27/18 0742 1,200 Units/hr at 07/27/18 0742  . ondansetron (ZOFRAN) tablet 4 mg  4 mg Oral Q6H PRN Ghimire, Werner Lean, MD       Or  . ondansetron [facility]) injection 4 mg  4 mg Intravenous Q6H PRN Maretta Bees, MD      . QUEtiapine (SEROQUEL) tablet 25 mg  25 mg Oral BID Maretta Bees, MD   25 mg at 07/27/18 8588  . simvastatin (ZOCOR) tablet 20 mg  20 mg Oral Daily Maretta Bees, MD   20 mg at 07/27/18 5027  . sodium chloride flush (NS) 0.9 % injection 3 mL  3 mL Intravenous Once Ghimire, Shanker M, MD      . sodium chloride flush (NS) 0.9 % injection 3 mL  3 mL Intravenous Q12H Maretta Bees, MD   3 mL at 07/26/18 2331  . sodium chloride flush (NS) 0.9 % injection 3 mL  3 mL Intravenous PRN Ghimire, Werner Lean, MD      . triamterene-hydrochlorothiazide (MAXZIDE-25) 37.5-25 MG per tablet 0.5 tablet  0.5 tablet Oral Daily Ghimire, Werner Lean, MD   0.5 tablet at 07/26/18 2317    REVIEW OF SYSTEMS:  Reviewed in her history and physical with nothing to add  PHYSICAL EXAM: Vitals:   07/26/18 1859 07/26/18 2042 07/26/18 2300 07/27/18 0521  BP: 123/60 (!) 122/104 118/72 109/75  Pulse: 84 83 85  80  Resp: 18 (!) 1  16  Temp: 98.6 F (37 C) 98.3 F (36.8 C)  (!) 97.5 F (36.4 C)  TempSrc: Axillary Oral  Oral  SpO2: 100% 100% 100% 100%  Weight: 69.8 kg     Height: 5\' 6"  (1.676 m)       GENERAL: The patient is a well-nourished female, in no acute distress. The vital signs are documented above. CARDIOVASCULAR: Palpable radial pulses palpable femoral and palpable popliteal pulses bilaterally.  I do not palpate pedal pulses PULMONARY: There is good air exchange  ABDOMEN: Soft and non-tender  MUSCULOSKELETAL: There are no major deformities or cyanosis. NEUROLOGIC: No focal weakness or paresthesias are detected. SKIN: There are no ulcers or rashes noted. PSYCHIATRIC: The patient has a normal affect.  DATA:  Saccular thoracic aneurysm with ammeter of the aneurysm and the native aorta at 8.2 cm.  Minimal flow in the saccular aneurysm. Saccular infrarenal aortic aneurysm maximal diameter of 7.5 cm with the saccular aneurysm and native aortic combined.  Again near complete thrombosis of the saccular portion.  The aneurysm begins at the renal arteries  MEDICAL ISSUES: I discussed these findings with the patient and her daughter.  She obviously is not a candidate for thoracoabdominal open repair.  Not a stent graft candidate due to the location of her aorta at the renal arteries.  Did discuss the risk of rupture of these aneurysms.  Due to her advanced age and comorbidities would not recommend any further follow-up or treatment.  Regarding her DVT, would treat as standard outpatient DVT.  Will not follow actively.  Please let us know if we can assist   Larina Earthlyodd F. Early, MD [facility] [place] Office Tel [phone] Pager [phone]

## 2018-07-27 NOTE — Discharge Instructions (Signed)
Information on my medicine - ELIQUIS (apixaban)  Why was Eliquis prescribed for you? Eliquis was prescribed to treat blood clots that may have been found in the veins of your legs (deep vein thrombosis) or in your lungs (pulmonary embolism) and to reduce the risk of them occurring again.  What do You need to know about Eliquis ? The starting dose is 10 mg (two 5 mg tablets) taken TWICE daily for the FIRST SEVEN (7) DAYS, then on (enter date) 08/03/2018  the dose is reduced to ONE 5 mg tablet taken TWICE daily.  Eliquis may be taken with or without food.   Try to take the dose about the same time in the morning and in the evening. If you have difficulty swallowing the tablet whole please discuss with your pharmacist how to take the medication safely.  Take Eliquis exactly as prescribed and DO NOT stop taking Eliquis without talking to the doctor who prescribed the medication.  Stopping may increase your risk of developing a new blood clot.  Refill your prescription before you run out.  After discharge, you should have regular check-up appointments with your healthcare provider that is prescribing your Eliquis.    What do you do if you miss a dose? If a dose of ELIQUIS is not taken at the scheduled time, take it as soon as possible on the same day and twice-daily administration should be resumed. The dose should not be doubled to make up for a missed dose.  Important Safety Information A possible side effect of Eliquis is bleeding. You should call your healthcare provider right away if you experience any of the following: ? Bleeding from an injury or your nose that does not stop. ? Unusual colored urine (red or dark brown) or unusual colored stools (red or black). ? Unusual bruising for unknown reasons. ? A serious fall or if you hit your head (even if there is no bleeding).  Some medicines may interact with Eliquis and might increase your risk of bleeding or clotting while on  Eliquis. To help avoid this, consult your healthcare provider or pharmacist prior to using any new prescription or non-prescription medications, including herbals, vitamins, non-steroidal anti-inflammatory drugs (NSAIDs) and supplements.  This website has more information on Eliquis (apixaban): http://www.eliquis.com/eliquis/home

## 2018-07-27 NOTE — Evaluation (Signed)
Physical Therapy Evaluation Patient Details Name: Sydney Lowery MRN: [id number] DOB: 26-May-1924 Today's Date: 07/27/2018   History of Present Illness  Patient is a 85 y.o. female with prior history of hypertension and dyslipidemia-presented with significant left lower extremity swelling-found to have extensive DVT involving multiple veins of her left lower extremity, started on anticoagulation and subsequently admitted to the hospitalist service.    Clinical Impression  Pt admitted with above diagnosis. Pt currently with functional limitations due to the deficits listed below (see PT Problem List). Pt was able to stand and pivot to 3N1 and back to bed with RW with min guard assist.  Pt close to baseline per daughter.  Daughter requested that CM come and discuss cost of meds.  She is also concerned about a dark area of skin on her back.  Nurse Is aware and to have MD talk to pt and daughter.  Feel that pt can benefit from PT at home but not too aggressive.  Pt will need to continue to mobilize to maintain strength.  Will follow acutely.   Pt will benefit from skilled PT to increase their independence and safety with mobility to allow discharge to the venue listed below.      Follow Up Recommendations Home health PT;Supervision/Assistance - 24 hour    Equipment Recommendations  None recommended by PT    Recommendations for Other Services       Precautions / Restrictions Precautions Precautions: Fall Restrictions Weight Bearing Restrictions: No      Mobility  Bed Mobility Overal bed mobility: Independent             General bed mobility comments: Came to EOB without assist. Somewhat slow  Transfers Overall transfer level: Needs assistance Equipment used: Rolling walker (2 wheeled) Transfers: Sit to/from [employer] Sit to Stand: Min guard Stand pivot transfers: Min guard       General transfer comment: Pt able to stand and pivot to 3N1 with RW without  physical assist.  Cues only.  Pt maintains flexed posture but daughter states this is how pt does it at home.   Ambulation/Gait                Stairs            Wheelchair Mobility    Modified Rankin (Stroke Patients Only)       Balance Overall balance assessment: Needs assistance Sitting-balance support: No upper extremity supported;Feet supported Sitting balance-Leahy Scale: Fair     Standing balance support: Bilateral upper extremity supported;During functional activity Standing balance-Leahy Scale: Poor Standing balance comment: relies on UE support and RW for standing                             Pertinent Vitals/Pain Pain Assessment: No/denies pain    Home Living Family/patient expects to be discharged to:: Private residence Living Arrangements: Children Available Help at Discharge: Personal care attendant;Family;Available 24 hours/day(aide 2 hours M-F) Type of Home: House Home Access: Ramped entrance     Home Layout: One level Home Equipment: Hospital bed;Wheelchair - [employer] - 2 wheels;Bedside commode      Prior Function Level of Independence: Needs assistance   Gait / Transfers Assistance Needed: wheelchair transfers only with RW  ADL's / Homemaking Assistance Needed: Sponge bathed        Hand Dominance        Extremity/Trunk Assessment   Upper Extremity Assessment Upper Extremity Assessment: Defer  to OT evaluation    Lower Extremity Assessment Lower Extremity Assessment: Generalized weakness    Cervical / Trunk Assessment Cervical / Trunk Assessment: Kyphotic  Communication   Communication: No difficulties  Cognition Arousal/Alertness: Awake/alert Behavior During Therapy: WFL for tasks assessed/performed Overall Cognitive Status: Within Functional Limits for tasks assessed                                        General Comments      Exercises     Assessment/Plan    PT Assessment  Patient needs continued PT services  PT Problem List Decreased balance;Decreased mobility;Decreased knowledge of use of DME;Decreased safety awareness;Decreased knowledge of precautions;Decreased activity tolerance       PT Treatment Interventions DME instruction;Gait training;Functional mobility training;Therapeutic activities;Therapeutic exercise;Balance training;Patient/family education    PT Goals (Current goals can be found in the Care Plan section)  Acute Rehab PT Goals Patient Stated Goal: to go home PT Goal Formulation: With patient Time For Goal Achievement: 08/10/18 Potential to Achieve Goals: Good    Frequency Min 3X/week   Barriers to discharge        Co-evaluation               AM-PAC PT "6 Clicks" Mobility  Outcome Measure Help needed turning from your back to your side while in a flat bed without using bedrails?: None Help needed moving from lying on your back to sitting on the side of a flat bed without using bedrails?: None Help needed moving to and from a bed to a chair (including a wheelchair)?: A Little Help needed standing up from a chair using your arms (e.g., wheelchair or bedside chair)?: A Little Help needed to walk in hospital room?: A Little Help needed climbing 3-5 steps with a railing? : A Lot 6 Click Score: 19    End of Session Equipment Utilized During Treatment: Gait belt Activity Tolerance: Patient limited by fatigue Patient left: in bed;with call bell/phone within reach;with family/visitor present Nurse Communication: Mobility status PT Visit Diagnosis: Unsteadiness on feet (R26.81);Muscle weakness (generalized) (M62.81)    Time: 6144-3154 PT Time Calculation (min) (ACUTE ONLY): 34 min   Charges:   PT Evaluation $PT Eval Moderate Complexity: 1 Mod PT Treatments $Neuromuscular Re-education: 8-22 mins         White,PT Acute Rehabilitation Services Pager:  [phone]  Office:  [phone]    Berline Lopes 07/27/2018,  2:09 PM

## 2018-07-27 NOTE — Progress Notes (Signed)
ANTICOAGULATION CONSULT NOTE - Initial Consult  Pharmacy Consult for IV heparin>Eliquis Indication: DVT  Allergies  Allergen Reactions  . Aspirin Other (See Comments)    Has diverticulitis, caused bleeding.  . Advil [Ibuprofen] Swelling    "tongue swelling"  . Atorvastatin   . Indomethacin     REACTION: gi upset  . Morphine And Related Swelling    Swelling in mouth  . Penicillins   . Sulfa Antibiotics Itching    Patient Measurements: Height: 5\' 6"  (167.6 cm) Weight: 153 lb 14.1 oz (69.8 kg) IBW/kg (Calculated) : 59.3 Heparin Dosing Weight:    Vital Signs: Temp: 97.5 F (36.4 C) (01/23 0521) Temp Source: Oral (01/23 0521) BP: 109/75 (01/23 0521) Pulse Rate: 80 (01/23 0521)  Labs: Recent Labs    07/26/18 1218 07/27/18 0238 07/27/18 1133  HGB 10.3* 9.4*  --   HCT 35.2* 30.4*  --   PLT 244 216  --   LABPROT 14.5  --   --   INR 1.14  --   --   HEPARINUNFRC  --  0.66 0.82*  CREATININE 0.98 0.89  --     Estimated Creatinine Clearance: 37.8 mL/min (by C-G formula based on SCr of 0.89 mg/dL).   Medical History: Past Medical History:  Diagnosis Date  . Allergic rhinitis 03/27/2008   Qualifier: Diagnosis of  By: Jonny Ruiz MD, Len Blalock   . ANEMIA-NOS 06/25/2009   Qualifier: Diagnosis of  By: Ewing CMA (AAMA), Robin    . Arthritis   . Bilateral foot-drop 04/23/2015  . Chronic low back pain 04/23/2015  . Essential hypertension 02/22/2007   Qualifier: Diagnosis of  By: Briscoe Burns CMA, Alvy Beal    . GOUT 09/23/2009   Qualifier: Diagnosis of  By: Jonny Ruiz MD, Len Blalock   . Hyperlipidemia 02/22/2007   Qualifier: Diagnosis of  By: Briscoe Burns CMA, Alvy Beal    . Hypertension   . OSTEOPENIA 03/27/2008   Qualifier: Diagnosis of  By: Jonny Ruiz MD, Len Blalock   . Psychosis [facility]) 01/21/2017   Assessment: CC/HPI: acute DVT  PMH: HTN, HLD,  Anticoag: Extensive DVT. HL 0.66>0.82,Hgb 10.3>9.4. Plts ok. Baseline INR 1.1. Transition to DOAC. Renal function WNL.  Goal of Therapy:  Therapeutic oral  anticoagulation    Plan:  D/c IV heparin Eliquis 10mg  BID x 7d then 5mg  BID  Crystal S. Merilynn Finland, PharmD, BCPS Clinical Staff Pharmacist Misty Stanley Stillinger 07/27/2018,1:00 PM

## 2018-07-27 NOTE — Progress Notes (Signed)
Nutrition Brief Note  RD approached in hallway by pt's daughter with questions regarding Heart Healthy diet.  Discussed with MD who approved liberalizing diet to Regular. Verbal with readback order placed. Discussed diet change with pt and daughter.  No additional nutrition interventions warranted at this time. If nutrition issues arise, please consult RD.     Earma Reading, MS, RD, LDN Inpatient Clinical Dietitian Pager: [phone] Weekend/After Hours: [phone].

## 2018-07-27 NOTE — Progress Notes (Signed)
ANTICOAGULATION CONSULT NOTE - Follow-up Consult  Pharmacy Consult for Heparin Indication: VTE Treatment  Allergies  Allergen Reactions  . Aspirin Other (See Comments)    Has diverticulitis, caused bleeding.  . Advil [Ibuprofen] Swelling    "tongue swelling"  . Atorvastatin   . Indomethacin     REACTION: gi upset  . Morphine And Related Swelling    Swelling in mouth  . Penicillins   . Sulfa Antibiotics Itching    Patient Measurements: Height: 5\' 6"  (167.6 cm) Weight: 153 lb 14.1 oz (69.8 kg) IBW/kg (Calculated) : 59.3 Heparin Dosing Weight: 74.3  Vital Signs: Temp: 98.3 F (36.8 C) (01/22 2042) Temp Source: Oral (01/22 2042) BP: 118/72 (01/22 2300) Pulse Rate: 85 (01/22 2300)  Labs: Recent Labs    07/26/18 1218 07/27/18 0238  HGB 10.3* 9.4*  HCT 35.2* 30.4*  PLT 244 216  LABPROT 14.5  --   INR 1.14  --   HEPARINUNFRC  --  0.66  CREATININE 0.98  --     Estimated Creatinine Clearance: 34.3 mL/min (by C-G formula based on SCr of 0.98 mg/dL).   Medical History: Past Medical History:  Diagnosis Date  . Allergic rhinitis 03/27/2008   Qualifier: Diagnosis of  By: Jonny Ruiz MD, Len Blalock   . ANEMIA-NOS 06/25/2009   Qualifier: Diagnosis of  By: Ewing CMA (AAMA), Robin    . Arthritis   . Bilateral foot-drop 04/23/2015  . Chronic low back pain 04/23/2015  . Essential hypertension 02/22/2007   Qualifier: Diagnosis of  By: Briscoe Burns CMA, Alvy Beal    . GOUT 09/23/2009   Qualifier: Diagnosis of  By: Jonny Ruiz MD, Len Blalock   . Hyperlipidemia 02/22/2007   Qualifier: Diagnosis of  By: Briscoe Burns CMA, Alvy Beal    . Hypertension   . OSTEOPENIA 03/27/2008   Qualifier: Diagnosis of  By: Jonny Ruiz MD, Len Blalock   . Psychosis (H[facility]) 01/21/2017    Assessment: 85 yo female with an acute DVT. Pharmacy consulted for Heparin initiation. Heparin level therapeutic (0.66) on gtt at 1200 units/hr. Hgb down to 9.4.  Goal of Therapy:  Heparin level 0.3-0.7 units/ml Monitor platelets by anticoagulation  protocol: Yes   Plan:  Continue heparin at 1200 units/hr Will f/u 6hr confirmatory heparin level  Christoper Fabian, PharmD, BCPS Clinical pharmacist  **Pharmacist phone directory can now be found on [email or web address] ([email or web address]).  Listed under [facility]. 07/27/2018, 3:23 AM

## 2018-07-27 NOTE — Progress Notes (Addendum)
PROGRESS NOTE        PATIENT DETAILS Name: Sydney Lowery Age: 85 y.o. Sex: female Date of Birth: [birth year] Admit Date: 07/26/2018 Admitting Physician Dewayne Shorter Levora Dredge, MD ZOX:WRUE, Len Blalock, MD  Brief Narrative: Patient is a 85 y.o. female with prior history of hypertension and dyslipidemia-presented with significant left lower extremity swelling-found to have extensive DVT involving multiple veins of her left lower extremity, started on anticoagulation and subsequently admitted to the hospitalist service.  See below for further details  Subjective: Continues to have significant swelling of her thigh and leg.  Assessment/Plan: Extensive DVT involving the left lower extremity involving the external iliac vein, left common femoral vein, left femoral vein, left proximal profunda vein, left popliteal vein, left gastrocnemius vein, and left soleal vein: Started on IV heparin on admission-spoke with vascular surgery (Dr Kearney Hard a candidate for catheter guided lytic therapy-leg essentially remains unchanged with significant swelling of the left thigh-discussed with patient's daughter-she does not prefer Coumadin-hence we will start Eliquis and stop IV heparin.  We will observe overnight-if she remains stable without worsening of left lower extremity-I suspect she will be stable to be discharged home tomorrow morning.  Daughter at bedside is aware of the difficult situation-patient is very frail, elderly-with significant fall risk due to unsteady gait-but given significant clot burden-benefits of anticoagulation outweigh the risks.    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  No further work-up required at this time given advanced age.  Hypertension: Continue amlodipine and Maxide-BP stable-follow.    Dyslipidemia:  Continue statin  Debility/deconditioning: Significantly debilitated at baseline-basically bed to wheelchair bound-apparently she has been in this condition for at least 1 year.  She apparently can take a few steps with assistance.  With worsening left lower extremity swelling due to DVT-she appears to be even more weak than her usual baseline-await PT evaluation.  DVT Prophylaxis: Full dose anticoagulation with Eliquis  Code Status: Full code   Family Communication: Daughter at bedside  Disposition Plan: Remain inpatient-but will plan on Home health vs SNF on discharge-await PT evaluation  Antimicrobial agents: Anti-infectives (From admission, onward)   None      Procedures: None  CONSULTS:  vascular surgery  Time spent: 25 minutes-Greater than 50% of this time was spent in counseling, explanation of diagnosis, planning of further management, and coordination of care.  MEDICATIONS: Scheduled Meds: . amLODipine  5 mg Oral Daily  . QUEtiapine  25 mg Oral BID  . simvastatin  20 mg Oral Daily  . sodium chloride flush  3 mL Intravenous Once  . sodium chloride flush  3 mL Intravenous Q12H  . triamterene-hydrochlorothiazide  0.5 tablet Oral Daily   Continuous Infusions: . sodium chloride    . heparin 1,200 Units/hr (07/27/18 0742)   PRN Meds:.sodium chloride, acetaminophen **OR** acetaminophen, albuterol, ondansetron **OR** ondansetron (ZOFRAN) IV, sodium chloride flush   PHYSICAL EXAM: Vital signs: Vitals:   07/26/18 1859 07/26/18 2042 07/26/18 2300 07/27/18 0521  BP: 123/60 (!) 122/104 118/72 109/75  Pulse: 84 83 85  80  Resp: 18 (!) 1  16  Temp: 98.6 F (37 C) 98.3 F (36.8 C)  (!) 97.5 F (36.4 C)  TempSrc: Axillary Oral  Oral  SpO2:  100% 100% 100% 100%  Weight: 69.8 kg     Height:  (1.676 m)      Filed Weights   07/26/18 1209 07/26/18 1859  Weight: 74.8 kg 69.8 kg   Body mass index is 24.84 kg/m.   General appearance :Awake, alert, not in any distress. Eyes:Pink conjunctiva HEENT: Atraumatic and Normocephalic Neck: supple Resp:Good air entry bilaterally, no added sounds  CVS: S1 S2 regular, no murmurs.  GI: Bowel sounds present, Non tender and not distended with no gaurding, rigidity or rebound.No organomegaly Extremities: Thigh with significant swelling, some 1+ edema in the left lower extremity as well Neurology:  speech clear,Non focal, sensation is grossly intact. Musculoskeletal:No digital cyanosis Skin:No Rash, warm and dry Wounds:N/A  I have personally reviewed following labs and imaging studies  LABORATORY DATA: CBC: Recent Labs  Lab 07/26/18 1218 07/27/18 0238  WBC 6.5 6.6  NEUTROABS 4.6  --   HGB 10.3* 9.4*  HCT 35.2* 30.4*  MCV 95.4 91.6  PLT 244 216    Basic Metabolic Panel: Recent Labs  Lab 07/26/18 1218 07/27/18 0238  NA 138 139  K 3.3* 3.9  CL 101 104  CO2 26 26  GLUCOSE 199* 86  BUN 16 16  CREATININE 0.98 0.89  CALCIUM 9.0 8.6*    GFR: Estimated Creatinine Clearance: 37.8 mL/min (by C-G formula based on SCr of 0.89 mg/dL).  Liver Function Tests: Recent Labs  Lab 07/26/18 1218  AST 18  ALT 11  ALKPHOS 64  BILITOT 0.6  PROT 7.3  ALBUMIN 3.0*   No results for input(s): LIPASE, AMYLASE in the last 168 hours. No results for input(s): AMMONIA in the last 168 hours.  Coagulation Profile: Recent Labs  Lab 07/26/18 1218  INR 1.14    Cardiac Enzymes: No results for input(s): CKTOTAL, CKMB, CKMBINDEX, TROPONINI in the last 168 hours.  BNP (last 3 results) No results for input(s): PROBNP in the last 8760 hours.  HbA1C: No results for input(s): HGBA1C in the last 72 hours.  CBG: No results for input(s): GLUCAP in the last 168 hours.  Lipid  Profile: No results for input(s): CHOL, HDL, LDLCALC, TRIG, CHOLHDL, LDLDIRECT in the last 72 hours.  Thyroid Function Tests: No results for input(s): TSH, T4TOTAL, FREET4, T3FREE, THYROIDAB in the last 72 hours.  Anemia Panel: No results for input(s): VITAMINB12, FOLATE, FERRITIN, TIBC, IRON, RETICCTPCT in the last 72 hours.  Urine analysis:    Component Value Date/Time   COLORURINE YELLOW 07/26/2018 1626   APPEARANCEUR CLEAR 07/26/2018 1626   LABSPEC 1.042 (H) 07/26/2018 1626   PHURINE 5.0 07/26/2018 1626   GLUCOSEU NEGATIVE 07/26/2018 1626   GLUCOSEU NEGATIVE 03/04/2017 1320   HGBUR NEGATIVE 07/26/2018 1626   BILIRUBINUR NEGATIVE 07/26/2018 1626   KETONESUR NEGATIVE 07/26/2018 1626   PROTEINUR NEGATIVE 07/26/2018 1626   UROBILINOGEN 0.2 03/04/2017 1320   NITRITE NEGATIVE 07/26/2018 1626   LEUKOCYTESUR MODERATE (A) 07/26/2018 1626    Sepsis Labs: Lactic Acid, Venous    Component Value Date/Time   LATICACIDVEN 1.4 07/26/2018 1413    MICROBIOLOGY: No results found for this or any previous visit (from the past 240 hour(s)).  RADIOLOGY STUDIES/RESULTS: Ct Angio Aortobifemoral W And/or Wo Contrast  Result Date: 07/26/2018 CLINICAL DATA:  85 year old female with a history of leg swelling EXAM: CT  ANGIOGRAPHY OF ABDOMINAL AORTA WITH ILIOFEMORAL RUNOFF TECHNIQUE: Multidetector CT imaging of the abdomen, pelvis and lower extremities was performed using the standard protocol during bolus administration of intravenous contrast. Multiplanar CT image reconstructions and MIPs were obtained to evaluate the vascular anatomy. CONTRAST:  ISOVUE-370 IOPAMIDOL (ISOVUE-370) INJECTION 76% COMPARISON:  None. FINDINGS: VASCULAR Aorta: Moderate atherosclerotic changes with aneurysmal disease of the aorta. There is incidental imaging of a saccular thoracic aortic aneurysm at the T9-T10 level, projecting towards the right. The greatest diameter of the aortic lumen to the dome of the aneurysm on  the coronal images is 8.2 cm. Axial images measures 8.5 cm, likely slightly over estimating the diameter given the angulation of the aorta. The saccular aneurysm is nearly completely thrombosed. Diameter of the thoracic aorta at the hiatus measures 4.6 cm. A second saccular aneurysm of the infrarenal abdominal aorta is present diameter of the dome to contralateral aortic wall on the axial images measures 7.5 cm. The lumen of this saccular aneurysm is nearly completely thrombosed, with minimal residual internal flow. The most cephalad aspect of the aneurysm sac is continuous with the right renal artery. The native aortic diameter at the left renal artery measures 2.4 cm. Diameter of the infrarenal abdominal aorta at the bifurcation measures 2.3 cm. No periaortic fluid or inflammatory changes.  No edema. Celiac: Atherosclerotic changes at the celiac artery origin. There is a beaded appearance of the celiac artery beyond the origin without inflammatory changes or dissection. Branch vessels are patent with typical branching pattern. SMA: Atherosclerotic changes at the SMA origin without significant stenosis. Branches are patent. Renals: The right renal artery demonstrates mild atherosclerotic changes at the origin. The artery is inseparable from the superior aspect of the aneurysm sac. Single right renal artery is visualized. Left renal artery demonstrates no significant atherosclerotic changes at the origin. Single left renal artery is identified. IMA: IMA remains patent with atherosclerotic changes at the origin. Right lower extremity: Mixed calcified and soft plaque of the right iliac system. Diameter of the common iliac artery measures at least 1.9 cm. Hypogastric artery is patent. External iliac artery is patent. Mild atherosclerotic changes of the common femoral artery. Profunda femoris and thigh branches are patent. Moderate calcified plaque of the length of the right superficial femoral artery. There is  significant deterioration of the contrast bolus of the right superficial femoral artery within the abductor canal, extending through the popliteal artery and into the tibial arteries. This is asymmetric when compared to the left. 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 Mild tibial arterial calcifications. Left lower extremity: Mixed calcified and soft plaque of the left iliac system. No dissection. Diameter of the left common iliac artery measures 1.9 cm. Hypogastric artery remains patent. External iliac artery with mild atherosclerotic changes, patent. Common femoral artery is patent with mild atherosclerotic changes. Profunda femoris and thigh branches are patent. Moderate atherosclerotic calcifications of the left superficial femoral artery without CT evidence of high-grade stenosis. The contrast bolus density is maintained to the popliteal artery, which is asymmetric compared to the right. No popliteal artery aneurysm identified. No significant opacification of the left tibial arteries which demonstrate mild calcified disease. Veins: Unremarkable appearance of the IVC. The left common iliac vein is significantly compressed under the left iliac artery, in a variant May-Thurner configuration. Hazy infiltration of the fat surrounding the left iliac vein, left common femoral vein, left femoral vein through the length of the thigh, with asymmetric size and density compared to the right.  Associated left thigh and leg swelling. Unremarkable appearance of the unopacified right-sided venous system. Review of the MIP images confirms the above findings. NON-VASCULAR Lower chest: Respiratory motion somewhat limits evaluation of the lower chest. Emphysema is present. No acute lower chest finding. Hepatobiliary: Arterial phase demonstrates hyperenhancing lesion in the left lobe, segment 3 measuring 10 mm, likely a perfusion anomaly. Unremarkable gallbladder Pancreas: Unremarkable pancreas Spleen: Unremarkable  spleen Adrenals/Urinary Tract: Unremarkable appearance of adrenal glands. Right: No right-sided hydronephrosis. Nonobstructive stone at the lower pole collecting system measures 4 mm. Cyst at the upper pole measures fluid density and cystic structure at the lower pole measures fluid density, both of which are consistent with Bosniak 1 cyst. There are additional small lesions which are too small to characterize. Left: No evidence of hydronephrosis. No left-sided nephrolithiasis. Symmetric perfusion to the right. Small rounded lesions are too small to characterize. Unremarkable appearance the bilateral ureters. Unremarkable urinary bladder. Stomach/Bowel: Unremarkable appearance of the stomach. Unremarkable appearance of small bowel. No evidence of obstruction. Normal appendix colonic diverticula without associated inflammatory changes. Lymphatic: No lymphadenopathy Mesenteric: No free fluid or air. No adenopathy. Reproductive: Unremarkable uterus Other: Small fat containing umbilical hernia Musculoskeletal: No acute displaced fracture. Focal medullary lesion in the proximal left femur, most likely enchondroma. No lytic lesions of the visualized skeletal elements. Degenerative changes of the spine. Extensive left leg swelling involving thigh and lower leg IMPRESSION: While there is no direct evidence of a left femoral DVT, this is the suspected diagnosis, as there is edema surrounding left iliac vein, left common femoral vein, left femoral vein, and what appears to be asymmetric density and size of the vein compared to the contralateral. There is also significant left lower extremity swelling. Correlation with forthcoming duplex is recommended. 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 Significant aortic disease: A-Saccular aneurysm of the thoracic aorta estimated 8.2 cm, with near complete thrombosis of the exophytic sac. Aortic aneurysm  NOS (ICD10-I71.9). B-saccular aneurysm of the infrarenal abdominal aorta estimated 7.5 cm, with near complete thrombosis of the exophytic sac. C-diameter of aorta at the hiatus measures 4.6 cm Referral for vascular evaluation recommended. These above results were called by telephone at the time of interpretation on 07/26/2018 at 5:02 pm to nurse caring for the patient, Ms Meriam Sprague. 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 Multilevel atherosclerotic disease: -Aortic atherosclerosis Aortic Atherosclerosis (ICD10-I70.0). -mild-to-moderate iliac arterial disease bilaterally -moderate bilateral femoropopliteal atherosclerosis -there is absence of contrast within the right SFA and popliteal artery, which is asymmetric compared to the left. This may be related to a thrombosed popliteal artery aneurysm, versus late arrival of the contrast secondary to arterial occlusive disease. Tibial arteries not evaluated. Correlation with noninvasive may be useful. -there is poor contrast of the left popliteal artery, with no opacification of the tibial arteries. Correlation with noninvasive may be useful. Hyperenhancing focus of the left liver is favored to represent benign vascular anomaly Diverticular disease Right nephrolithiasis Signed, Yvone Neu. Reyne Dumas, RPVI [facility] Electronically Signed   By: Gilmer Mor D.O.   On: 07/26/2018 17:03   Vas [place] Lower Extremity Venous (dvt) (only [facility])  Result Date: 07/26/2018  Lower Venous Study Indications: Swelling.  Limitations: Poor ultrasound/tissue interface. Performing Technologist: Chanda Busing RVT  Examination Guidelines: A complete evaluation includes B-mode imaging, spectral Doppler, color Doppler, and power Doppler  as needed of all accessible portions of each vessel. Bilateral testing is considered an integral part of a complete examination.  Limited examinations for reoccurring indications may be performed as noted.  Right Venous Findings: +---+---------------+---------+-----------+----------+-------+    CompressibilityPhasicitySpontaneityPropertiesSummary +---+---------------+---------+-----------+----------+-------+ CFVFull           Yes      Yes                          +---+---------------+---------+-----------+----------+-------+  Left Venous Findings: +---------------+---------------+---------+-----------+----------+-------------+                CompressibilityPhasicitySpontaneityPropertiesSummary       +---------------+---------------+---------+-----------+----------+-------------+ CFV            None           No       No                                 +---------------+---------------+---------+-----------+----------+-------------+ SFJ            Full                                                       +---------------+---------------+---------+-----------+----------+-------------+ FV Prox        None           No       No                                 +---------------+---------------+---------+-----------+----------+-------------+ FV Mid         None                                                       +---------------+---------------+---------+-----------+----------+-------------+ FV Distal      None                                                       +---------------+---------------+---------+-----------+----------+-------------+ PFV            None           No       No                                 +---------------+---------------+---------+-----------+----------+-------------+ POP            Partial        No       No                                 +---------------+---------------+---------+-----------+----------+-------------+ PTV            Full                                                        +---------------+---------------+---------+-----------+----------+-------------+  PERO           Full                                                       +---------------+---------------+---------+-----------+----------+-------------+ Soleal         None                                                       +---------------+---------------+---------+-----------+----------+-------------+ Gastroc        None                                                       +---------------+---------------+---------+-----------+----------+-------------+ External iliac None           No       No                                 vein                                                                      +---------------+---------------+---------+-----------+----------+-------------+ Common iliac                                                Not           vein                                                        visualized    +---------------+---------------+---------+-----------+----------+-------------+    Summary: Right: No evidence of common femoral vein obstruction. Left: Findings consistent with acute deep vein thrombosis involving the external iliac vein, left common femoral vein, left femoral vein, left proximal profunda vein, left popliteal vein, left gastrocnemius vein, and left soleal vein. No cystic structure  found in the popliteal fossa. Unable to visualize the common iliac vein due to bowel gas.  *See table(s) above for measurements and observations.    Preliminary      LOS: 1 day   Jeoffrey Massed, MD  [facility]  If 7PM-7AM, please contact night-coverage  Please page via [email or web address] [email or web address]-click on MD name and type text message  07/27/2018, 10:21 AM

## 2018-07-28 MED ORDER — APIXABAN 5 MG PO TABS: ORAL_TABLET | ORAL | 0 refills | Status: DC

## 2018-07-28 MED FILL — ELIQUIS 5 MG TABLET: 5 | 24 days supply | Qty: 60 | Fill #0

## 2018-07-28 NOTE — Care Management Important Message (Signed)
Important Message  Patient Details  Name: Sydney Lowery MRN: [id number] Date of Birth: [birth year]   Medicare Important Message Given:  Yes    Megan P Shular 07/28/2018, 11:29 AM

## 2018-07-28 NOTE — Discharge Summary (Signed)
Physician Discharge Summary  Sydney LoaMary E York WUJ:[id number] DOB: August 16, 1923 DOA: 07/26/2018  PCP: Corwin LevinsJohn, James W, MD  Admit date: 07/26/2018 Discharge date: 07/28/2018  Admitted From: Home Disposition: Home  Recommendations for Outpatient Follow-up:  1. Follow up with PCP in 1-2 weeks 2. Please obtain BMP/CBC in one week your next doctors visit.  3. Advised to take Eliquis as prescribed follow-up outpatient vascular as necessary, information provided   Discharge Condition: Stable CODE STATUS: Full code Diet recommendation: 2 g salt  Brief/Interim Summary: 85 year old female with past medical history of essential hypertension and hyperlipidemia came to the hospital with complains of leg pain and was found to have left lower extremity DVT which was extensive.  She was started on anticoagulation- IV heparin and then eventually transition to Eliquis.  Case was discussed by the previous provider with vascular surgery was determined patient is not a candidate for lytic therapy and recommended anticoagulation management.  After discussion with the family-they opted to treat this with Eliquis.  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 At this time patient is reached maximal benefit from hospital stay and stable for discharge with outpatient follow-up recommendations as stated above.   Discharge Diagnoses:  Principal Problem:   DVT of lower limb, acute (HCC) Active Problems:   Hyperlipidemia   Essential hypertension  Left lower extremity acute DVT, multiple veins with lower extremity swelling -Pain is improved.  Initially case was discussed by the provider with on-call vascular surgeon and recommended medical management and it was deemed she is not a candidate for lytic therapy.  Heparin was transitioned to Eliquis.  Recommended outpatient follow-up as necessary with the vascular surgery.  Needs to follow-up outpatient with  primary care provider as stated above.  8.2 cm saccular aneurysm of thoracic aorta Multiple areas of aneurysm -Case discussed by vascular surgery-not a candidate for endovascular repair.  Essential hypertension -Resume home meds  Hyperlipidemia -Continue statin  Generalized weakness and deconditioning - Physical therapy recommended home health.  Patient on IV heparin and later Eliquis for DVT during the hospitalization Discharge today Daughter at bedside, answered all the questions.  Discharge Instructions   Allergies as of 07/28/2018      Reactions   Aspirin Other (See Comments)   Has diverticulitis, caused bleeding.   Advil [ibuprofen] Swelling   "tongue swelling"   Atorvastatin    Indomethacin    REACTION: gi upset   Morphine And Related Swelling   Swelling in mouth   Penicillins    Sulfa Antibiotics Itching      Medication List    TAKE these medications   amLODipine 5 MG tablet Commonly known as:  NORVASC Take 1 tablet (5 mg total) by mouth daily. Overdue for annual appt must see provider for future refills   apixaban 5 MG Tabs tablet Commonly known as:  ELIQUIS Take 2 tablets (10 mg total) by mouth 2 (two) times daily for 6 days, THEN 1 tablet (5 mg total) 2 (two) times daily for 30 days. Start taking on:  July 28, 2018   ECHINACEA C COMPLETE PO Take 1 tablet by mouth daily.   QUEtiapine 25 MG tablet Commonly known as:  SEROQUEL Take 1 tablet (25 mg total) by mouth 2 (two) times daily.   simvastatin 20 MG tablet Commonly known as:  ZOCOR TAKE 1 TABLET BY MOUTH  DAILY   traMADol 50 MG tablet Commonly known as:  ULTRAM TAKE  1 TABLET BY MOUTH EVERY 6 HOURS What changed:    when to take this  reasons to take this   triamterene-hydrochlorothiazide 37.5-25 MG tablet Commonly known as:  MAXZIDE-25 TAKE ONE-HALF TABLET BY  MOUTH DAILY   Turmeric 500 MG Tabs Take 500 mg by mouth daily.   VITAMIN D-3 PO Take 1,000 Int'l Units by mouth.       Follow-up Information    [facility] Follow up.   Specialty:  Home Health Services Why:  home health services arranged Contact information: [address] [place] [address] [phone]        Corwin Levins, MD. Schedule an appointment as soon as possible for a visit in 1 week(s).   Specialties:  Internal Medicine, Radiology Contact information: [address] [place] [address] [phone]        Larina Earthly, MD. Schedule an appointment as soon as possible for a visit in 1 month(s).   Specialties:  Vascular Surgery, Cardiology Why:  As needed.  Contact information: [address] [place] [address] [phone]          Allergies  Allergen Reactions  . Aspirin Other (See Comments)    Has diverticulitis, caused bleeding.  . Advil [Ibuprofen] Swelling    "tongue swelling"  . Atorvastatin   . Indomethacin     REACTION: gi upset  . Morphine And Related Swelling    Swelling in mouth  . Penicillins   . Sulfa Antibiotics Itching    You were cared for by a hospitalist during your hospital stay. If you have any questions about your discharge medications or the care you received while you were in the hospital after you are discharged, you can call the unit and asked to speak with the hospitalist on call if the hospitalist that took care of you is not available. Once you are discharged, your primary care physician will handle any further medical issues. Please note that no refills for any discharge medications will be authorized once you are discharged, as it is imperative that you return to your primary care physician (or establish a relationship with a primary care physician if you do not have one) for your aftercare needs so that they can reassess your need for medications and monitor your lab values.  Consultations:  Vascular surgery   Procedures/Studies: Ct Angio Aortobifemoral W And/or Wo Contrast  Result  Date: 07/26/2018 CLINICAL DATA:  85 year old female with a history of leg swelling EXAM: CT ANGIOGRAPHY OF ABDOMINAL AORTA WITH ILIOFEMORAL RUNOFF TECHNIQUE: Multidetector CT imaging of the abdomen, pelvis and lower extremities was performed using the standard protocol during bolus administration of intravenous contrast. Multiplanar CT image reconstructions and MIPs were obtained to evaluate the vascular anatomy. CONTRAST:  ISOVUE-370 IOPAMIDOL (ISOVUE-370) INJECTION 76% COMPARISON:  None. FINDINGS: VASCULAR Aorta: Moderate atherosclerotic changes with aneurysmal disease of the aorta. There is incidental imaging of a saccular thoracic aortic aneurysm at the T9-T10 level, projecting towards the right. The greatest diameter of the aortic lumen to the dome of the aneurysm on the coronal images is 8.2 cm. Axial images measures 8.5 cm, likely slightly over estimating the diameter given the angulation of the aorta. The saccular aneurysm is nearly completely thrombosed. Diameter of the thoracic aorta at the hiatus measures 4.6 cm. A second saccular aneurysm of the infrarenal abdominal aorta is present diameter of the dome to contralateral aortic wall on the axial images measures 7.5 cm. The  lumen of this saccular aneurysm is nearly completely thrombosed, with minimal residual internal flow. The most cephalad aspect of the aneurysm sac is continuous with the right renal artery. The native aortic diameter at the left renal artery measures 2.4 cm. Diameter of the infrarenal abdominal aorta at the bifurcation measures 2.3 cm. No periaortic fluid or inflammatory changes.  No edema. Celiac: Atherosclerotic changes at the celiac artery origin. There is a beaded appearance of the celiac artery beyond the origin without inflammatory changes or dissection. Branch vessels are patent with typical branching pattern. SMA: Atherosclerotic changes at the SMA origin without significant stenosis. Branches are patent. Renals: The right  renal artery demonstrates mild atherosclerotic changes at the origin. The artery is inseparable from the superior aspect of the aneurysm sac. Single right renal artery is visualized. Left renal artery demonstrates no significant atherosclerotic changes at the origin. Single left renal artery is identified. IMA: IMA remains patent with atherosclerotic changes at the origin. Right lower extremity: Mixed calcified and soft plaque of the right iliac system. Diameter of the common iliac artery measures at least 1.9 cm. Hypogastric artery is patent. External iliac artery is patent. Mild atherosclerotic changes of the common femoral artery. Profunda femoris and thigh branches are patent. Moderate calcified plaque of the length of the right superficial femoral artery. There is significant deterioration of the contrast bolus of the right superficial femoral artery within the abductor canal, extending through the popliteal artery and into the tibial arteries. This is asymmetric when compared to the left. 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 Mild tibial arterial calcifications. Left lower extremity: Mixed calcified and soft plaque of the left iliac system. No dissection. Diameter of the left common iliac artery measures 1.9 cm. Hypogastric artery remains patent. External iliac artery with mild atherosclerotic changes, patent. Common femoral artery is patent with mild atherosclerotic changes. Profunda femoris and thigh branches are patent. Moderate atherosclerotic calcifications of the left superficial femoral artery without CT evidence of high-grade stenosis. The contrast bolus density is maintained to the popliteal artery, which is asymmetric compared to the right. No popliteal artery aneurysm identified. No significant opacification of the left tibial arteries which demonstrate mild calcified disease. Veins: Unremarkable appearance of the IVC. The left common iliac vein is significantly compressed under the  left iliac artery, in a variant May-Thurner configuration. Hazy infiltration of the fat surrounding the left iliac vein, left common femoral vein, left femoral vein through the length of the thigh, with asymmetric size and density compared to the right. Associated left thigh and leg swelling. Unremarkable appearance of the unopacified right-sided venous system. Review of the MIP images confirms the above findings. NON-VASCULAR Lower chest: Respiratory motion somewhat limits evaluation of the lower chest. Emphysema is present. No acute lower chest finding. Hepatobiliary: Arterial phase demonstrates hyperenhancing lesion in the left lobe, segment 3 measuring 10 mm, likely a perfusion anomaly. Unremarkable gallbladder Pancreas: Unremarkable pancreas Spleen: Unremarkable spleen Adrenals/Urinary Tract: Unremarkable appearance of adrenal glands. Right: No right-sided hydronephrosis. Nonobstructive stone at the lower pole collecting system measures 4 mm. Cyst at the upper pole measures fluid density and cystic structure at the lower pole measures fluid density, both of which are consistent with Bosniak 1 cyst. There are additional small lesions which are too small to characterize. Left: No evidence of hydronephrosis. No left-sided nephrolithiasis. Symmetric perfusion to the right. Small rounded lesions are too small to characterize. Unremarkable appearance the bilateral ureters. Unremarkable urinary bladder. Stomach/Bowel: Unremarkable appearance of the stomach. Unremarkable  appearance of small bowel. No evidence of obstruction. Normal appendix colonic diverticula without associated inflammatory changes. Lymphatic: No lymphadenopathy Mesenteric: No free fluid or air. No adenopathy. Reproductive: Unremarkable uterus Other: Small fat containing umbilical hernia Musculoskeletal: No acute displaced fracture. Focal medullary lesion in the proximal left femur, most likely enchondroma. No lytic lesions of the visualized skeletal  elements. Degenerative changes of the spine. Extensive left leg swelling involving thigh and lower leg IMPRESSION: While there is no direct evidence of a left femoral DVT, this is the suspected diagnosis, as there is edema surrounding left iliac vein, left common femoral vein, left femoral vein, and what appears to be asymmetric density and size of the vein compared to the contralateral. There is also significant left lower extremity swelling. Correlation with forthcoming duplex is recommended. 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 Significant aortic disease: A-Saccular aneurysm of the thoracic aorta estimated 8.2 cm, with near complete thrombosis of the exophytic sac. Aortic aneurysm NOS (ICD10-I71.9). B-saccular aneurysm of the infrarenal abdominal aorta estimated 7.5 cm, with near complete thrombosis of the exophytic sac. C-diameter of aorta at the hiatus measures 4.6 cm Referral for vascular evaluation recommended. These above results were called by telephone at the time of interpretation on 07/26/2018 at 5:02 pm to nurse caring for the patient, Ms Meriam Sprague. 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 Multilevel atherosclerotic disease: -Aortic atherosclerosis Aortic Atherosclerosis (ICD10-I70.0). -mild-to-moderate iliac arterial disease bilaterally -moderate bilateral femoropopliteal atherosclerosis -there is absence of contrast within the right SFA and popliteal artery, which is asymmetric compared to the left. This may be related to a thrombosed popliteal artery aneurysm, versus late arrival of the contrast secondary to arterial occlusive disease. Tibial arteries not evaluated. Correlation with noninvasive may be useful. -there is poor contrast of the left popliteal artery, with no opacification of the tibial arteries. Correlation with noninvasive may be  useful. Hyperenhancing focus of the left liver is favored to represent benign vascular anomaly Diverticular disease Right nephrolithiasis Signed, Yvone Neu. Reyne Dumas, RPVI [facility] Radiology Electronically Signed   By: Gilmer Mor D.O.   On: 07/26/2018 17:03   Vas [place] Lower Extremity Venous (dvt) (only [facility])  Result Date: 07/27/2018  Lower Venous Study Indications: Swelling.  Limitations: Poor ultrasound/tissue interface. Performing Technologist: Chanda Busing RVT  Examination Guidelines: A complete evaluation includes B-mode imaging, spectral Doppler, color Doppler, and power Doppler as needed of all accessible portions of each vessel. Bilateral testing is considered an integral part of a complete examination. Limited examinations for reoccurring indications may be performed as noted.  Right Venous Findings: +---+---------------+---------+-----------+----------+-------+    CompressibilityPhasicitySpontaneityPropertiesSummary +---+---------------+---------+-----------+----------+-------+ CFVFull           Yes      Yes                          +---+---------------+---------+-----------+----------+-------+  Left Venous Findings: +---------------+---------------+---------+-----------+----------+-------------+                CompressibilityPhasicitySpontaneityPropertiesSummary       +---------------+---------------+---------+-----------+----------+-------------+ CFV            None           No       No                                 +---------------+---------------+---------+-----------+----------+-------------+  SFJ            Full                                                       +---------------+---------------+---------+-----------+----------+-------------+ FV Prox        None           No       No                                 +---------------+---------------+---------+-----------+----------+-------------+  FV Mid         None                                                       +---------------+---------------+---------+-----------+----------+-------------+ FV Distal      None                                                       +---------------+---------------+---------+-----------+----------+-------------+ PFV            None           No       No                                 +---------------+---------------+---------+-----------+----------+-------------+ POP            Partial        No       No                                 +---------------+---------------+---------+-----------+----------+-------------+ PTV            Full                                                       +---------------+---------------+---------+-----------+----------+-------------+ PERO           Full                                                       +---------------+---------------+---------+-----------+----------+-------------+ Soleal         None                                                       +---------------+---------------+---------+-----------+----------+-------------+ Gastroc        None                                                       +---------------+---------------+---------+-----------+----------+-------------+  External iliac None           No       No                                 vein                                                                      +---------------+---------------+---------+-----------+----------+-------------+ Common iliac                                                Not           vein                                                        visualized    +---------------+---------------+---------+-----------+----------+-------------+    Summary: Right: No evidence of common femoral vein obstruction. Left: Findings consistent with acute deep vein thrombosis involving the external iliac vein, left  common femoral vein, left femoral vein, left proximal profunda vein, left popliteal vein, left gastrocnemius vein, and left soleal vein. No cystic structure  found in the popliteal fossa. Unable to visualize the common iliac vein due to bowel gas.  *See table(s) above for measurements and observations. Electronically signed by Sherald Hess MD on 07/27/2018 at 2:04:09 PM.    Final       Subjective: Feels better, no complaints.  Daughter at bedside and I answered all of her questions.   Discharge Exam: Vitals:   07/27/18 2137 07/28/18 0449  BP: 106/64 98/76  Pulse: 77 70  Resp: 18 18  Temp: 98.1 F (36.7 C) 97.9 F (36.6 C)  SpO2: 100% 100%   Vitals:   07/27/18 0521 07/27/18 1509 07/27/18 2137 07/28/18 0449  BP: 109/75 90/60 106/64 98/76  Pulse: 80 68 77 70  Resp: 16 18 18 18   Temp: (!) 97.5 F (36.4 C)  98.1 F (36.7 C) 97.9 F (36.6 C)  TempSrc: Oral  Oral   SpO2: 100%  100% 100%  Weight:      Height:        General: Pt is alert, awake, not in acute distress, elderly frail. Cardiovascular: RRR, S1/S2 +, no rubs, no gallops Respiratory: CTA bilaterally, no wheezing, no rhonchi Abdominal: Soft, NT, ND, bowel sounds + Extremities: no edema, no cyanosis    The results of significant diagnostics from this hospitalization (including imaging, microbiology, ancillary and laboratory) are listed below for reference.     Microbiology: No results found for this or any previous visit (from the past 240 hour(s)).   Labs: BNP (last 3 results) No results for input(s): BNP in the last 8760 hours. Basic Metabolic Panel: Recent Labs  Lab 07/26/18 1218 07/27/18 0238  NA 138 139  K 3.3* 3.9  CL 101 104  CO2 26 26  GLUCOSE 199* 86  BUN 16 16  CREATININE 0.98 0.89  CALCIUM 9.0 8.6*   Liver Function Tests: Recent Labs  Lab 07/26/18 1218  AST 18  ALT 11  ALKPHOS 64  BILITOT 0.6  PROT 7.3  ALBUMIN 3.0*   No results for input(s): LIPASE, AMYLASE in the last 168  hours. No results for input(s): AMMONIA in the last 168 hours. CBC: Recent Labs  Lab 07/26/18 1218 07/27/18 0238  WBC 6.5 6.6  NEUTROABS 4.6  --   HGB 10.3* 9.4*  HCT 35.2* 30.4*  MCV 95.4 91.6  PLT 244 216   Cardiac Enzymes: No results for input(s): CKTOTAL, CKMB, CKMBINDEX, TROPONINI in the last 168 hours. BNP: Invalid input(s): POCBNP CBG: No results for input(s): GLUCAP in the last 168 hours. D-Dimer No results for input(s): DDIMER in the last 72 hours. Hgb A1c No results for input(s): HGBA1C in the last 72 hours. Lipid Profile No results for input(s): CHOL, HDL, LDLCALC, TRIG, CHOLHDL, LDLDIRECT in the last 72 hours. Thyroid function studies No results for input(s): TSH, T4TOTAL, T3FREE, THYROIDAB in the last 72 hours.  Invalid input(s): FREET3 Anemia work up No results for input(s): VITAMINB12, FOLATE, FERRITIN, TIBC, IRON, RETICCTPCT in the last 72 hours. Urinalysis    Component Value Date/Time   COLORURINE YELLOW 07/26/2018 1626   APPEARANCEUR CLEAR 07/26/2018 1626   LABSPEC 1.042 (H) 07/26/2018 1626   PHURINE 5.0 07/26/2018 1626   GLUCOSEU NEGATIVE 07/26/2018 1626   GLUCOSEU NEGATIVE 03/04/2017 1320   HGBUR NEGATIVE 07/26/2018 1626   BILIRUBINUR NEGATIVE 07/26/2018 1626   KETONESUR NEGATIVE 07/26/2018 1626   PROTEINUR NEGATIVE 07/26/2018 1626   UROBILINOGEN 0.2 03/04/2017 1320   NITRITE NEGATIVE 07/26/2018 1626   LEUKOCYTESUR MODERATE (A) 07/26/2018 1626   Sepsis Labs Invalid input(s): PROCALCITONIN,  WBC,  LACTICIDVEN Microbiology No results found for this or any previous visit (from the past 240 hour(s)).   Time coordinating discharge:  I have spent 35 minutes face to face with the patient and on the ward discussing the patients care, assessment, plan and disposition with other care givers. >50% of the time was devoted counseling the patient about the risks and benefits of treatment/Discharge disposition and coordinating care.   SIGNED:   Dimple NanasAnkit  Chirag Amin, MD  [facility] 07/28/2018, 12:20 PM   If 7PM-7AM, please contact night-coverage [email or web address]

## 2018-07-28 NOTE — Consult Note (Signed)
            [facility] CM Primary Care Navigator  07/28/2018  ELEANOR BRYERS 1924/07/04 [id number]   Went to see patient at the bedside to identify possible discharge needs but patient and visitors in the room are in the midst of praying and more visitors just arrived in the room to visit with patient.   Will attempt to see patient at another time for further [facility]- CM needs oncesheis available in the room.    Addendum (07/31/18):  Attempt to go back and seepatient at the bedside to identify possible discharge needs but she was already discharged hometowards the weekend. Patient went home with home health services.  Per MD note,patientpresentedwith complaints of leg pain and was found to have left lower extremity DVT which was extensive, was started on anticoagulation. (DVT of left lower limb, hyperlipidemia, HTN) Per Inpatient CM note, daughter will have primary care provider complete the patient assistance program with [employer] Fisher since patient is on a fixed income and copay cost is going to be expensive.  Primary care provider's office is listed as providing transition of care (TOC) follow-up.  Patient has discharge instruction to follow-up with primary care provider in 1- 2 weeks and cardiology follow-up in 4 weeks.   For additional questions please contact:  Karin Golden A. Ajel, BSN, RN-BC [facility] PRIMARY CARE Navigator Cell: [phone]

## 2018-07-28 NOTE — Care Management Note (Addendum)
Case Management Note  Patient Details  Name: Sydney Lowery MRN: [id number] Date of Birth: 1924/05/13  Subjective/Objective:     Left lower extremity swelling, DVT               Action/Plan: NCM spoke to pt and dtr, Natalie at bedside. Offered choice for HH/CMS list provided and placed on chart. Dtr is agreeable to [facility] for [facility] Has RW and wheelchair at home. Contacted [facility] to make aware of dc home today. PTAR arranged. Pt received Eliquis from [facility] pharmacy. Provided dtr with application for Patient Assistance program with [employer] Fisher. States pt is on a fixed income and copay cost is going to be expensive. Dtr will have PCP completed PAP.  Expected Discharge Date:  07/28/18               Expected Discharge Plan:  Home w Home Health Services  In-House Referral:  NA  Discharge planning Services  CM Consult  Post Acute Care Choice:  NA Choice offered to:  Patient, Adult Children  DME Arranged:  N/A DME Agency:  NA  HH Arranged:  RN, PT HH Agency:  [facility]  Status of Service:  Completed, signed off  If discussed at Long Length of Stay Meetings, dates discussed:    Additional Comments:  Elliot Cousin, RN 07/28/2018, 12:23 PM

## 2018-07-28 NOTE — Progress Notes (Signed)
CSW spoke with patient's daughter at bedside regarding transportation questions. CSW explained that we submit a medical necessity for to PTAR but cannot guarantee that PTAR will be paid for.  Osborne Casco Rayyan LCSW [phone]

## 2018-08-01 ENCOUNTER — Telehealth: Payer: Self-pay | Admitting: *Deleted

## 2018-08-01 DIAGNOSIS — M6281 Muscle weakness (generalized): Secondary | ICD-10-CM | POA: Diagnosis not present

## 2018-08-01 DIAGNOSIS — R0902 Hypoxemia: Secondary | ICD-10-CM | POA: Diagnosis not present

## 2018-08-01 DIAGNOSIS — J449 Chronic obstructive pulmonary disease, unspecified: Secondary | ICD-10-CM | POA: Diagnosis not present

## 2018-08-01 DIAGNOSIS — L8990 Pressure ulcer of unspecified site, unspecified stage: Secondary | ICD-10-CM | POA: Diagnosis not present

## 2018-08-01 NOTE — Telephone Encounter (Signed)
Pt was on TCM report was d/c 07/28/18. Tried calling pt # on file is not in service. Called pt daughter Sydney Lowery) no answer LMOM for RTC. Pt need to make hosp f/u w/Dr. Jonny Ruiz. Per chart pt has not seen MD since 2018. Not even sure if she still see him..sent CRM to [facility] for FYI...Raechel Chute

## 2018-08-02 NOTE — Telephone Encounter (Signed)
Tried calling pt/daughter again still no answer LMOM RTC...lmb

## 2018-08-03 DIAGNOSIS — M79661 Pain in right lower leg: Secondary | ICD-10-CM | POA: Diagnosis not present

## 2018-08-03 DIAGNOSIS — L8992 Pressure ulcer of unspecified site, stage 2: Secondary | ICD-10-CM | POA: Diagnosis not present

## 2018-08-03 DIAGNOSIS — N750 Cyst of Bartholin's gland: Secondary | ICD-10-CM | POA: Diagnosis not present

## 2018-08-03 DIAGNOSIS — Z0000 Encounter for general adult medical examination without abnormal findings: Secondary | ICD-10-CM | POA: Diagnosis not present

## 2018-08-03 DIAGNOSIS — I82409 Acute embolism and thrombosis of unspecified deep veins of unspecified lower extremity: Secondary | ICD-10-CM | POA: Diagnosis not present

## 2018-08-03 NOTE — Telephone Encounter (Signed)
Tried calling pt/daughter again still no answer. Per chart they have not called back to schedule f/u appt. Pt actually haven't seen MD since 2018. Closing encounter.Marland KitchenRaechel Chute

## 2018-08-07 ENCOUNTER — Telehealth: Payer: Self-pay | Admitting: Internal Medicine

## 2018-08-07 NOTE — Telephone Encounter (Signed)
Copied from CRM [id number]. Topic: Referral - Status >> Aug 07, 2018  1:51 PM Maia Petties wrote: Reason for CRM: Bjorn Loser called to verify the phone #s for the pt. Provided her pt #, which she said is non-working, and pts daughter ER contact, which she states she has left messages for the past week and not received a return call. They will not be able to start care for the patient.

## 2018-08-07 NOTE — Telephone Encounter (Signed)
Noted  

## 2018-08-19 DIAGNOSIS — J449 Chronic obstructive pulmonary disease, unspecified: Secondary | ICD-10-CM | POA: Diagnosis not present

## 2018-09-17 DIAGNOSIS — R092 Respiratory arrest: Secondary | ICD-10-CM | POA: Diagnosis not present

## 2018-09-17 DIAGNOSIS — J449 Chronic obstructive pulmonary disease, unspecified: Secondary | ICD-10-CM | POA: Diagnosis not present

## 2018-09-17 DIAGNOSIS — M6281 Muscle weakness (generalized): Secondary | ICD-10-CM | POA: Diagnosis not present

## 2018-09-18 ENCOUNTER — Other Ambulatory Visit: Payer: Self-pay | Admitting: Internal Medicine

## 2018-10-13 DIAGNOSIS — L8990 Pressure ulcer of unspecified site, unspecified stage: Secondary | ICD-10-CM | POA: Diagnosis not present

## 2018-10-18 DIAGNOSIS — J449 Chronic obstructive pulmonary disease, unspecified: Secondary | ICD-10-CM | POA: Diagnosis not present

## 2018-10-18 DIAGNOSIS — R0902 Hypoxemia: Secondary | ICD-10-CM | POA: Diagnosis not present

## 2018-10-18 DIAGNOSIS — M6281 Muscle weakness (generalized): Secondary | ICD-10-CM | POA: Diagnosis not present

## 2018-10-26 DIAGNOSIS — I82409 Acute embolism and thrombosis of unspecified deep veins of unspecified lower extremity: Secondary | ICD-10-CM | POA: Diagnosis not present

## 2018-10-26 DIAGNOSIS — Z7901 Long term (current) use of anticoagulants: Secondary | ICD-10-CM | POA: Diagnosis not present

## 2018-10-26 DIAGNOSIS — J449 Chronic obstructive pulmonary disease, unspecified: Secondary | ICD-10-CM | POA: Diagnosis not present

## 2018-10-26 DIAGNOSIS — I712 Thoracic aortic aneurysm, without rupture: Secondary | ICD-10-CM | POA: Diagnosis not present

## 2018-11-09 DIAGNOSIS — Z7901 Long term (current) use of anticoagulants: Secondary | ICD-10-CM | POA: Diagnosis not present

## 2018-11-09 DIAGNOSIS — M1009 Idiopathic gout, multiple sites: Secondary | ICD-10-CM | POA: Diagnosis not present

## 2018-11-09 DIAGNOSIS — Z7189 Other specified counseling: Secondary | ICD-10-CM | POA: Diagnosis not present

## 2018-11-09 DIAGNOSIS — J449 Chronic obstructive pulmonary disease, unspecified: Secondary | ICD-10-CM | POA: Diagnosis not present

## 2018-11-09 DIAGNOSIS — L03119 Cellulitis of unspecified part of limb: Secondary | ICD-10-CM | POA: Diagnosis not present

## 2018-11-17 DIAGNOSIS — R0902 Hypoxemia: Secondary | ICD-10-CM | POA: Diagnosis not present

## 2018-11-17 DIAGNOSIS — J449 Chronic obstructive pulmonary disease, unspecified: Secondary | ICD-10-CM | POA: Diagnosis not present

## 2018-11-17 DIAGNOSIS — M6281 Muscle weakness (generalized): Secondary | ICD-10-CM | POA: Diagnosis not present

## 2018-12-09 DIAGNOSIS — Z7901 Long term (current) use of anticoagulants: Secondary | ICD-10-CM | POA: Diagnosis not present

## 2018-12-09 DIAGNOSIS — M79661 Pain in right lower leg: Secondary | ICD-10-CM | POA: Diagnosis not present

## 2018-12-09 DIAGNOSIS — J449 Chronic obstructive pulmonary disease, unspecified: Secondary | ICD-10-CM | POA: Diagnosis not present

## 2018-12-09 DIAGNOSIS — I10 Essential (primary) hypertension: Secondary | ICD-10-CM | POA: Diagnosis not present

## 2018-12-18 DIAGNOSIS — R0902 Hypoxemia: Secondary | ICD-10-CM | POA: Diagnosis not present

## 2018-12-18 DIAGNOSIS — M6281 Muscle weakness (generalized): Secondary | ICD-10-CM | POA: Diagnosis not present

## 2018-12-18 DIAGNOSIS — J449 Chronic obstructive pulmonary disease, unspecified: Secondary | ICD-10-CM | POA: Diagnosis not present

## 2019-01-08 DIAGNOSIS — I10 Essential (primary) hypertension: Secondary | ICD-10-CM | POA: Diagnosis not present

## 2019-01-08 DIAGNOSIS — J449 Chronic obstructive pulmonary disease, unspecified: Secondary | ICD-10-CM | POA: Diagnosis not present

## 2019-01-08 DIAGNOSIS — I2781 Cor pulmonale (chronic): Secondary | ICD-10-CM | POA: Diagnosis not present

## 2019-01-08 DIAGNOSIS — Z7901 Long term (current) use of anticoagulants: Secondary | ICD-10-CM | POA: Diagnosis not present

## 2019-01-17 DIAGNOSIS — J449 Chronic obstructive pulmonary disease, unspecified: Secondary | ICD-10-CM | POA: Diagnosis not present

## 2019-01-17 DIAGNOSIS — M6281 Muscle weakness (generalized): Secondary | ICD-10-CM | POA: Diagnosis not present

## 2019-01-17 DIAGNOSIS — R0902 Hypoxemia: Secondary | ICD-10-CM | POA: Diagnosis not present

## 2019-03-06 DIAGNOSIS — G894 Chronic pain syndrome: Secondary | ICD-10-CM | POA: Diagnosis not present

## 2019-03-06 DIAGNOSIS — Z7901 Long term (current) use of anticoagulants: Secondary | ICD-10-CM | POA: Diagnosis not present

## 2019-03-06 DIAGNOSIS — I10 Essential (primary) hypertension: Secondary | ICD-10-CM | POA: Diagnosis not present

## 2019-03-06 DIAGNOSIS — J449 Chronic obstructive pulmonary disease, unspecified: Secondary | ICD-10-CM | POA: Diagnosis not present

## 2019-04-10 DIAGNOSIS — Z23 Encounter for immunization: Secondary | ICD-10-CM | POA: Diagnosis not present

## 2019-04-10 DIAGNOSIS — J449 Chronic obstructive pulmonary disease, unspecified: Secondary | ICD-10-CM | POA: Diagnosis not present

## 2019-04-10 DIAGNOSIS — I10 Essential (primary) hypertension: Secondary | ICD-10-CM | POA: Diagnosis not present

## 2019-04-10 DIAGNOSIS — R0902 Hypoxemia: Secondary | ICD-10-CM | POA: Diagnosis not present

## 2019-04-10 DIAGNOSIS — G894 Chronic pain syndrome: Secondary | ICD-10-CM | POA: Diagnosis not present

## 2019-04-10 DIAGNOSIS — Z7901 Long term (current) use of anticoagulants: Secondary | ICD-10-CM | POA: Diagnosis not present

## 2019-04-13 DIAGNOSIS — J449 Chronic obstructive pulmonary disease, unspecified: Secondary | ICD-10-CM | POA: Diagnosis not present

## 2019-10-24 IMAGING — CT CT ANGIO AOBIFEM WO/W CM
1 of 10 series · 4 of 16 positions shown, 5 images · IV contrast (OMNI 350)
Comparison: None.

CLINICAL DATA: [AGE] female with a history of leg swelling

EXAM:
CT ANGIOGRAPHY OF ABDOMINAL AORTA WITH ILIOFEMORAL RUNOFF
TECHNIQUE: Multidetector CT imaging of the abdomen, pelvis and lower
extremities was performed using the standard protocol during bolus
administration of intravenous contrast. Multiplanar CT image
reconstructions and MIPs were obtained to evaluate the vascular
anatomy.
CONTRAST:  100mL ITSLD7-5ZT IOPAMIDOL (ITSLD7-5ZT) INJECTION 76%

[Series 7: cta runoff (id) · axial · 0.90mm/px · z∈[+270,+1062]mm · 4 of 440 slices shown, 5 images]
[im 88/440  soft-tissue]
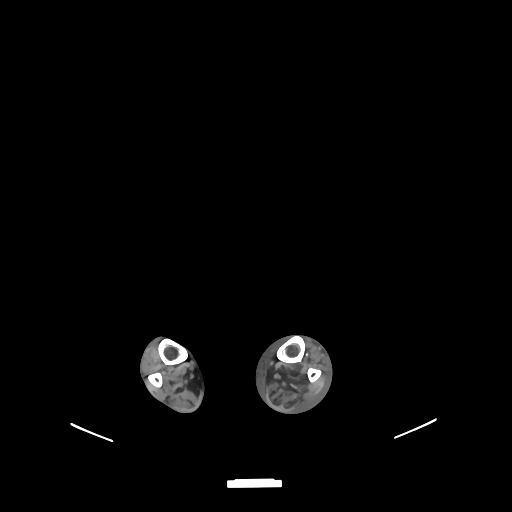
[im 88/440  bone]
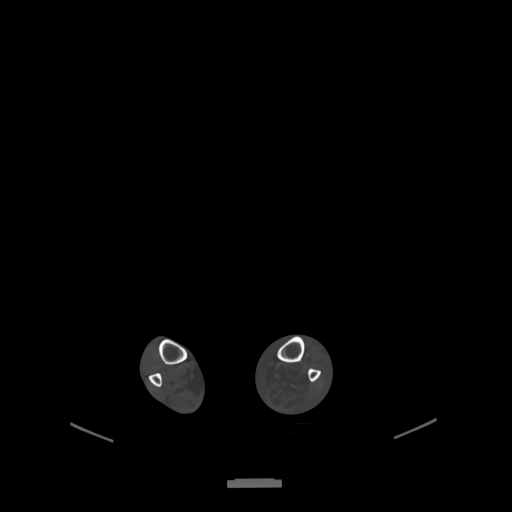
[im 176/440  soft-tissue]
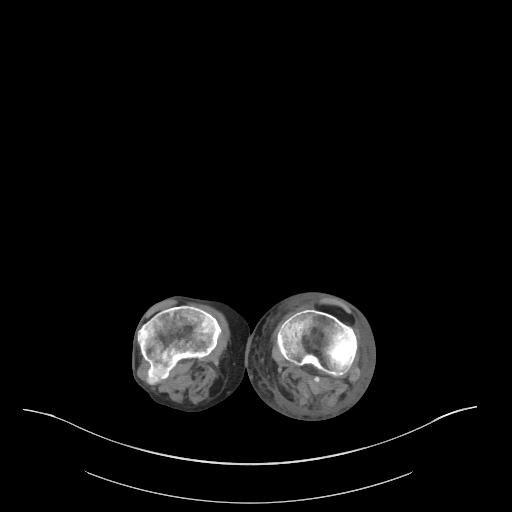
[im 264/440  soft-tissue]
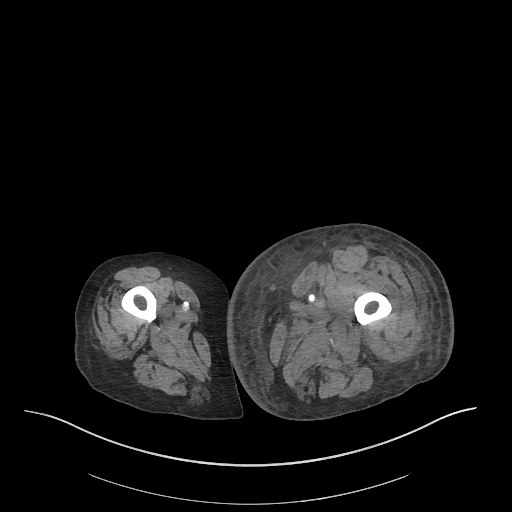
[im 352/440  soft-tissue]
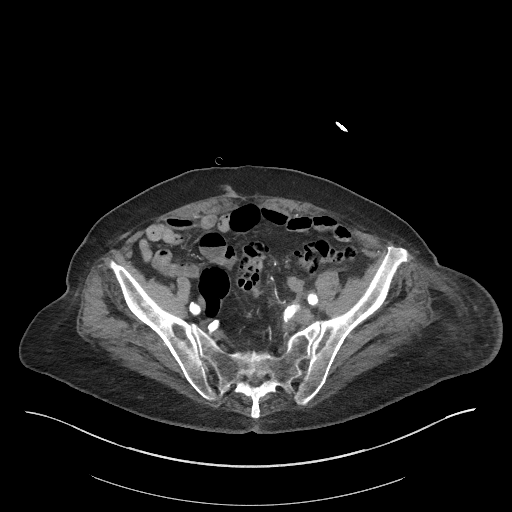

[4 of 16 positions shown; findings below may reference images not displayed]

FINDINGS: VASCULAR

Aorta: Moderate atherosclerotic changes with aneurysmal disease of
the aorta.

There is incidental imaging of a saccular thoracic aortic aneurysm
at the T9-T10 level, projecting towards the right. The greatest
diameter of the aortic lumen to the dome of the aneurysm on the
coronal images is 8.2 cm. Axial images measures 8.5 cm, likely
slightly over estimating the diameter given the angulation of the
aorta. The saccular aneurysm is nearly completely thrombosed.
Diameter of the thoracic aorta at the hiatus measures 4.6 cm.

A second saccular aneurysm of the infrarenal abdominal aorta is
present diameter of the dome to contralateral aortic wall on the
axial images measures 7.5 cm. The lumen of this saccular aneurysm is
nearly completely thrombosed, with minimal residual internal flow.
The most cephalad aspect of the aneurysm sac is continuous with the
right renal artery. The native aortic diameter at the left renal
artery measures 2.4 cm. Diameter of the infrarenal abdominal aorta
at the bifurcation measures 2.3 cm.

No periaortic fluid or inflammatory changes.  No edema.

Celiac: Atherosclerotic changes at the celiac artery origin. There
is a beaded appearance of the celiac artery beyond the origin
without inflammatory changes or dissection. Branch vessels are
patent with typical branching pattern.

SMA: Atherosclerotic changes at the SMA origin without significant
stenosis. Branches are patent.

Renals: The right renal artery demonstrates mild atherosclerotic
changes at the origin. The artery is inseparable from the superior
aspect of the aneurysm sac. Single right renal artery is visualized.

Left renal artery demonstrates no significant atherosclerotic
changes at the origin. Single left renal artery is identified.

IMA: IMA remains patent with atherosclerotic changes at the origin.

Right lower extremity:

Mixed calcified and soft plaque of the right iliac system. Diameter
of the common iliac artery measures at least 1.9 cm. Hypogastric
artery is patent. External iliac artery is patent.

Mild atherosclerotic changes of the common femoral artery.

Profunda femoris and thigh branches are patent.

Moderate calcified plaque of the length of the right superficial
femoral artery. There is significant deterioration of the contrast
bolus of the right superficial femoral artery within the abductor
canal, extending through the popliteal artery and into the tibial
arteries. This is asymmetric when compared to the left.

Popliteal artery aneurysm is present measuring 2.2 cm. Mild tibial
arterial calcifications.

Left lower extremity:

Mixed calcified and soft plaque of the left iliac system. No
dissection. Diameter of the left common iliac artery measures
cm. Hypogastric artery remains patent. External iliac artery with
mild atherosclerotic changes, patent.

Common femoral artery is patent with mild atherosclerotic changes.

Profunda femoris and thigh branches are patent.

Moderate atherosclerotic calcifications of the left superficial
femoral artery without CT evidence of high-grade stenosis.

The contrast bolus density is maintained to the popliteal artery,
which is asymmetric compared to the right. No popliteal artery
aneurysm identified.

No significant opacification of the left tibial arteries which
demonstrate mild calcified disease.

Veins: Unremarkable appearance of the IVC. The left common iliac
vein is significantly compressed under the left iliac artery, in a
variant [place] configuration. Hazy infiltration of the fat
surrounding the left iliac vein, left common femoral vein, left
femoral vein through the length of the thigh, with asymmetric size
and density compared to the right. Associated left thigh and leg
swelling.

Unremarkable appearance of the unopacified right-sided venous
system.

Review of the MIP images confirms the above findings.

NON-VASCULAR

Lower chest: Respiratory motion somewhat limits evaluation of the
lower chest. Emphysema is present. No acute lower chest finding.

Hepatobiliary: Arterial phase demonstrates hyperenhancing lesion in
the left lobe, segment 3 measuring 10 mm, likely a perfusion
anomaly. Unremarkable gallbladder

Pancreas: Unremarkable pancreas

Spleen: Unremarkable spleen

Adrenals/Urinary Tract: Unremarkable appearance of adrenal glands.

Right:

No right-sided hydronephrosis. Nonobstructive stone at the lower
pole collecting system measures 4 mm. Cyst at the upper pole
measures fluid density and cystic structure at the lower pole
measures fluid density, both of which are consistent with Bosniak 1
cyst. There are additional small lesions which are too small to
characterize.

Left:

No evidence of hydronephrosis. No left-sided nephrolithiasis.
Symmetric perfusion to the right. Small rounded lesions are too
small to characterize.

Unremarkable appearance the bilateral ureters. Unremarkable urinary
bladder.

Stomach/Bowel: Unremarkable appearance of the stomach. Unremarkable
appearance of small bowel. No evidence of obstruction. Normal
appendix colonic diverticula without associated inflammatory
changes.

Lymphatic: No lymphadenopathy

Mesenteric: No free fluid or air. No adenopathy.

Reproductive: Unremarkable uterus

Other: Small fat containing umbilical hernia

Musculoskeletal: No acute displaced fracture. Focal medullary lesion
in the proximal left femur, most likely enchondroma. No lytic
lesions of the visualized skeletal elements. Degenerative changes of
the spine. Extensive left leg swelling involving thigh and lower leg
IMPRESSION: While there is no direct evidence of a left femoral DVT, this is the
suspected diagnosis, as there is edema surrounding left iliac vein,
left common femoral vein, left femoral vein, and what appears to be
asymmetric density and size of the vein compared to the
contralateral. There is also significant left lower extremity
swelling. Correlation with forthcoming duplex is recommended.

Variant [place] configuration of the left iliac vein, which is
compressed by the tortuous left common iliac artery.

Significant aortic disease:

A-Saccular aneurysm of the thoracic aorta estimated 8.2 cm, with
near complete thrombosis of the exophytic sac. Aortic aneurysm NOS
(S0J8C-OQ8.B).

B-saccular aneurysm of the infrarenal abdominal aorta estimated
cm, with near complete thrombosis of the exophytic sac.

C-diameter of aorta at the hiatus measures 4.6 cm

Referral for vascular evaluation recommended.

These above results were called by telephone at the time of
interpretation on 07/26/2018 at [DATE] to nurse caring for the
patient, Ms Shaka Partee.

Bilateral common iliac artery aneurysm measuring 1.9 cm

Right popliteal artery aneurysm measuring 2.1 cm.

Multilevel atherosclerotic disease:

-Aortic atherosclerosis Aortic Atherosclerosis (S0J8C-GPS.S).

-mild-to-moderate iliac arterial disease bilaterally

-moderate bilateral femoropopliteal atherosclerosis

-there is absence of contrast within the right SFA and popliteal
artery, which is asymmetric compared to the left. This may be
related to a thrombosed popliteal artery aneurysm, versus late
arrival of the contrast secondary to arterial occlusive disease.
Tibial arteries not evaluated. Correlation with noninvasive may be
useful.

-there is poor contrast of the left popliteal artery, with no
opacification of the tibial arteries. Correlation with noninvasive
may be useful.

Hyperenhancing focus of the left liver is favored to represent
benign vascular anomaly

Diverticular disease

Right nephrolithiasis

## 2020-10-06 ENCOUNTER — Emergency Department

## 2020-10-06 ENCOUNTER — Inpatient Hospital Stay
Admission: EM | Admit: 2020-10-06 | Discharge: 2020-10-08 | DRG: 689 | Disposition: A | Discharge status: Hospice | Attending: Internal Medicine | Admitting: Internal Medicine

## 2020-10-06 DIAGNOSIS — R404 Transient alteration of awareness: Secondary | ICD-10-CM | POA: Diagnosis not present

## 2020-10-06 DIAGNOSIS — F17210 Nicotine dependence, cigarettes, uncomplicated: Secondary | ICD-10-CM | POA: Diagnosis present

## 2020-10-06 DIAGNOSIS — N179 Acute kidney failure, unspecified: Secondary | ICD-10-CM | POA: Diagnosis present

## 2020-10-06 DIAGNOSIS — Z7901 Long term (current) use of anticoagulants: Secondary | ICD-10-CM | POA: Diagnosis not present

## 2020-10-06 DIAGNOSIS — E785 Hyperlipidemia, unspecified: Secondary | ICD-10-CM | POA: Diagnosis present

## 2020-10-06 DIAGNOSIS — Z79899 Other long term (current) drug therapy: Secondary | ICD-10-CM | POA: Diagnosis not present

## 2020-10-06 DIAGNOSIS — Z86718 Personal history of other venous thrombosis and embolism: Secondary | ICD-10-CM | POA: Diagnosis not present

## 2020-10-06 DIAGNOSIS — Z20822 Contact with and (suspected) exposure to covid-19: Secondary | ICD-10-CM | POA: Diagnosis present

## 2020-10-06 DIAGNOSIS — G9341 Metabolic encephalopathy: Secondary | ICD-10-CM | POA: Diagnosis present

## 2020-10-06 DIAGNOSIS — E876 Hypokalemia: Secondary | ICD-10-CM | POA: Diagnosis present

## 2020-10-06 DIAGNOSIS — F29 Unspecified psychosis not due to a substance or known physiological condition: Secondary | ICD-10-CM | POA: Diagnosis present

## 2020-10-06 DIAGNOSIS — L8990 Pressure ulcer of unspecified site, unspecified stage: Secondary | ICD-10-CM | POA: Insufficient documentation

## 2020-10-06 DIAGNOSIS — D509 Iron deficiency anemia, unspecified: Secondary | ICD-10-CM | POA: Diagnosis present

## 2020-10-06 DIAGNOSIS — I10 Essential (primary) hypertension: Secondary | ICD-10-CM | POA: Diagnosis present

## 2020-10-06 DIAGNOSIS — R4182 Altered mental status, unspecified: Secondary | ICD-10-CM | POA: Diagnosis present

## 2020-10-06 DIAGNOSIS — B9689 Other specified bacterial agents as the cause of diseases classified elsewhere: Secondary | ICD-10-CM | POA: Diagnosis present

## 2020-10-06 DIAGNOSIS — R1310 Dysphagia, unspecified: Secondary | ICD-10-CM | POA: Diagnosis present

## 2020-10-06 DIAGNOSIS — L89322 Pressure ulcer of left buttock, stage 2: Secondary | ICD-10-CM | POA: Diagnosis present

## 2020-10-06 DIAGNOSIS — D519 Vitamin B12 deficiency anemia, unspecified: Secondary | ICD-10-CM | POA: Diagnosis present

## 2020-10-06 DIAGNOSIS — N390 Urinary tract infection, site not specified: Secondary | ICD-10-CM | POA: Diagnosis not present

## 2020-10-06 DIAGNOSIS — L89312 Pressure ulcer of right buttock, stage 2: Secondary | ICD-10-CM | POA: Diagnosis present

## 2020-10-06 LAB — CBC WITH DIFFERENTIAL/PLATELET
Abs Immature Granulocytes: 0.08 10*3/uL — ABNORMAL HIGH (ref 0.00–0.07)
Basophils Absolute: 0 10*3/uL (ref 0.0–0.1)
Basophils Relative: 0 %
Eosinophils Absolute: 0 10*3/uL (ref 0.0–0.5)
Eosinophils Relative: 0 %
HCT: 33.4 % — ABNORMAL LOW (ref 36.0–46.0)
Hemoglobin: 9.7 g/dL — ABNORMAL LOW (ref 12.0–15.0)
Immature Granulocytes: 1 %
Lymphocytes Relative: 11 %
Lymphs Abs: 1.4 10*3/uL (ref 0.7–4.0)
MCH: 27.7 pg (ref 26.0–34.0)
MCHC: 29 g/dL — ABNORMAL LOW (ref 30.0–36.0)
MCV: 95.4 fL (ref 80.0–100.0)
Monocytes Absolute: 1 10*3/uL (ref 0.1–1.0)
Monocytes Relative: 8 %
Neutro Abs: 10.2 10*3/uL — ABNORMAL HIGH (ref 1.7–7.7)
Neutrophils Relative %: 80 %
Platelets: 300 10*3/uL (ref 150–400)
RBC: 3.5 MIL/uL — ABNORMAL LOW (ref 3.87–5.11)
RDW: 17.2 % — ABNORMAL HIGH (ref 11.5–15.5)
WBC: 12.8 10*3/uL — ABNORMAL HIGH (ref 4.0–10.5)
nRBC: 0 % (ref 0.0–0.2)

## 2020-10-06 LAB — COMPREHENSIVE METABOLIC PANEL
ALT: 9 U/L (ref 0–44)
AST: 16 U/L (ref 15–41)
Albumin: 2.6 g/dL — ABNORMAL LOW (ref 3.5–5.0)
Alkaline Phosphatase: 61 U/L (ref 38–126)
Anion gap: 9 (ref 5–15)
BUN: 27 mg/dL — ABNORMAL HIGH (ref 8–23)
CO2: 31 mmol/L (ref 22–32)
Calcium: 8.9 mg/dL (ref 8.9–10.3)
Chloride: 95 mmol/L — ABNORMAL LOW (ref 98–111)
Creatinine, Ser: 1.06 mg/dL — ABNORMAL HIGH (ref 0.44–1.00)
GFR, Estimated: 49 mL/min — ABNORMAL LOW (ref >60)
Glucose, Bld: 108 mg/dL — ABNORMAL HIGH (ref 70–99)
Potassium: 3.9 mmol/L (ref 3.5–5.1)
Sodium: 135 mmol/L (ref 135–145)
Total Bilirubin: 0.9 mg/dL (ref 0.3–1.2)
Total Protein: 6.9 g/dL (ref 6.5–8.1)

## 2020-10-06 LAB — URINALYSIS, COMPLETE (UACMP) WITH MICROSCOPIC
Bilirubin Urine: NEGATIVE
Glucose, UA: NEGATIVE mg/dL
Ketones, ur: NEGATIVE mg/dL
Nitrite: NEGATIVE
Protein, ur: 100 mg/dL — AB
Specific Gravity, Urine: 1.017 (ref 1.005–1.030)
WBC, UA: >50 WBC/hpf — ABNORMAL HIGH (ref 0–5)
pH: 7 (ref 5.0–8.0)

## 2020-10-06 LAB — FERRITIN: Ferritin: 234 ng/mL (ref 11–307)

## 2020-10-06 LAB — IRON AND TIBC
Iron: 16 ug/dL — ABNORMAL LOW (ref 28–170)
Saturation Ratios: 9 % — ABNORMAL LOW (ref 10.4–31.8)
TIBC: 174 ug/dL — ABNORMAL LOW (ref 250–450)
UIBC: 158 ug/dL

## 2020-10-06 LAB — RESP PANEL BY RT-PCR (FLU A&B, COVID) ARPGX2
Influenza A by PCR: NEGATIVE
Influenza B by PCR: NEGATIVE
SARS Coronavirus 2 by RT PCR: NEGATIVE

## 2020-10-06 LAB — TROPONIN I (HIGH SENSITIVITY): Troponin I (High Sensitivity): 15 ng/L (ref <18)

## 2020-10-06 LAB — LACTIC ACID, PLASMA
Lactic Acid, Venous: 2 mmol/L (ref 0.5–1.9)
Lactic Acid, Venous: 1.6 mmol/L (ref 0.5–1.9)

## 2020-10-06 LAB — FOLATE: Folate: 9.2 ng/mL (ref >5.9)

## 2020-10-06 LAB — PROTIME-INR
INR: 1.2 (ref 0.8–1.2)
Prothrombin Time: 14.7 seconds (ref 11.4–15.2)

## 2020-10-06 LAB — APTT: aPTT: 31 seconds (ref 24–36)

## 2020-10-06 MED ORDER — SODIUM CHLORIDE 0.9 % IV SOLN
1.0000 g | Freq: Once | INTRAVENOUS | Status: AC
  Administered 2020-10-06: 1 g via INTRAVENOUS
  Filled 2020-10-06: qty 10

## 2020-10-06 MED ORDER — SODIUM CHLORIDE 0.9 % IV BOLUS
500.0000 mL | Freq: Once | INTRAVENOUS | Status: AC
  Administered 2020-10-06: 500 mL via INTRAVENOUS

## 2020-10-06 MED ORDER — SODIUM CHLORIDE 0.9 % IV SOLN
1.0000 g | INTRAVENOUS | Status: DC
  Administered 2020-10-07 – 2020-10-08 (×2): 1 g via INTRAVENOUS
  Filled 2020-10-06 (×2): qty 1

## 2020-10-06 MED ORDER — SODIUM CHLORIDE 0.9 % IV SOLN: INTRAVENOUS | Status: DC

## 2020-10-06 MED ORDER — ALBUTEROL SULFATE (2.5 MG/3ML) 0.083% IN NEBU: 2.5000 mg | INHALATION_SOLUTION | Freq: Four times a day (QID) | RESPIRATORY_TRACT | Status: DC | PRN

## 2020-10-06 MED ORDER — TRIAMTERENE-HCTZ 37.5-25 MG PO TABS
0.5000 | ORAL_TABLET | Freq: Every day | ORAL | Status: DC
  Filled 2020-10-06: qty 0.5

## 2020-10-06 MED ORDER — MAGNESIUM CITRATE PO SOLN
1.0000 | Freq: Once | ORAL | Status: DC | PRN
  Filled 2020-10-06: qty 296

## 2020-10-06 MED ORDER — ACETAMINOPHEN 325 MG PO TABS
650.0000 mg | ORAL_TABLET | Freq: Four times a day (QID) | ORAL | Status: DC | PRN
  Filled 2020-10-06: qty 2

## 2020-10-06 MED ORDER — BISACODYL 5 MG PO TBEC: 5.0000 mg | DELAYED_RELEASE_TABLET | Freq: Every day | ORAL | Status: DC | PRN

## 2020-10-06 MED ORDER — SENNOSIDES-DOCUSATE SODIUM 8.6-50 MG PO TABS: 1.0000 | ORAL_TABLET | Freq: Every evening | ORAL | Status: DC | PRN

## 2020-10-06 MED ORDER — ACETAMINOPHEN 650 MG RE SUPP: 650.0000 mg | Freq: Four times a day (QID) | RECTAL | Status: DC | PRN

## 2020-10-06 MED ORDER — APIXABAN 5 MG PO TABS
5.0000 mg | ORAL_TABLET | Freq: Two times a day (BID) | ORAL | Status: DC
  Administered 2020-10-07: 10:00:00 5 mg via ORAL
  Filled 2020-10-06 (×2): qty 1

## 2020-10-06 MED ORDER — TRAMADOL HCL 50 MG PO TABS: 50.0000 mg | ORAL_TABLET | Freq: Four times a day (QID) | ORAL | Status: DC | PRN

## 2020-10-06 MED ORDER — ONDANSETRON HCL 4 MG/2ML IJ SOLN: 4.0000 mg | Freq: Four times a day (QID) | INTRAMUSCULAR | Status: DC | PRN

## 2020-10-06 MED ORDER — QUETIAPINE FUMARATE 25 MG PO TABS
25.0000 mg | ORAL_TABLET | Freq: Two times a day (BID) | ORAL | Status: DC
  Administered 2020-10-07 – 2020-10-08 (×3): 25 mg via ORAL
  Filled 2020-10-06 (×4): qty 1

## 2020-10-06 MED ORDER — IPRATROPIUM BROMIDE 0.02 % IN SOLN: 0.5000 mg | Freq: Four times a day (QID) | RESPIRATORY_TRACT | Status: DC | PRN

## 2020-10-06 MED ORDER — SIMVASTATIN 20 MG PO TABS
20.0000 mg | ORAL_TABLET | Freq: Every day | ORAL | Status: DC
  Administered 2020-10-07: 22:00:00 20 mg via ORAL
  Filled 2020-10-06 (×2): qty 1

## 2020-10-06 MED ORDER — AMLODIPINE BESYLATE 5 MG PO TABS: 5.0000 mg | ORAL_TABLET | Freq: Every day | ORAL | Status: DC

## 2020-10-06 MED ORDER — ONDANSETRON HCL 4 MG PO TABS: 4.0000 mg | ORAL_TABLET | Freq: Four times a day (QID) | ORAL | Status: DC | PRN

## 2020-10-06 NOTE — ED Provider Notes (Signed)
[facility] Emergency Department Provider Note  Time seen: 3:42 PM  I have reviewed the triage vital signs and the nursing notes.   HISTORY  Chief Complaint Altered Mental Status   HPI Sydney Lowery is a 87 y.o. female with a past medical history of anemia, hypertension, hyperlipidemia, psychosis, presents to the emergency department for altered mental status x1 day.  Per report patient very somnolent/lethargic today which is atypical.  They are unable to describe however her baseline mental state.  Given the report of altered mental state we will obtain labs, CT scan of the head and continue to closely monitor.  Patient is mildly hypotensive 89/54 we will dose 500 cc of normal saline while awaiting for the results.  We will discuss with [facility] as the patient is reportedly on hospice.   Patient cannot contribute to her current history or review of systems.  Past Medical History:  Diagnosis Date   Allergic rhinitis 03/27/2008   Qualifier: Diagnosis of  By: Jonny Ruiz MD, Len Blalock    ANEMIA-NOS 06/25/2009   Qualifier: Diagnosis of  By: Ewing CMA (AAMA), Robin     Arthritis    Bilateral foot-drop 04/23/2015   Chronic low back pain 04/23/2015   Essential hypertension 02/22/2007   Qualifier: Diagnosis of  By: Briscoe Burns CMA, Alvy Beal     GOUT 09/23/2009   Qualifier: Diagnosis of  By: Jonny Ruiz MD, Len Blalock    Hyperlipidemia 02/22/2007   Qualifier: Diagnosis of  By: Briscoe Burns CMA, Lakisha     Hypertension    OSTEOPENIA 03/27/2008   Qualifier: Diagnosis of  By: Jonny Ruiz MD, Len Blalock    Psychosis (HCC) 01/21/2017    Patient Active Problem List   Diagnosis Date Noted   DVT of lower limb, acute (HCC) 07/26/2018   Psychosis (HCC) 01/21/2017   Right knee pain 09/15/2016   Left ear hearing loss 09/15/2016   Abdominal pain 08/20/2015   Encounter for well adult exam with abnormal findings 04/23/2015   Bilateral foot-drop 04/23/2015   Chronic low back pain 04/23/2015   GOUT 09/23/2009    Acute gouty arthropathy 06/25/2009   ANEMIA-NOS 06/25/2009   Pain in joint, ankle and foot 06/25/2009   Allergic rhinitis 03/27/2008   DIVERTICULOSIS, COLON 03/27/2008   OSTEOPENIA 03/27/2008   FREQUENCY, URINARY 03/26/2008   Hyperlipidemia 02/22/2007   Essential hypertension 02/22/2007    Past Surgical History:  Procedure Laterality Date   arm fracture Left 1967   Fx. in 3 places and had a dropped wrist    Prior to Admission medications   Medication Sig Start Date End Date Taking? Authorizing Provider  amLODipine (NORVASC) 5 MG tablet TAKE 1 TABLET BY MOUTH  DAILY 09/18/18   Corwin Levins, MD  apixaban (ELIQUIS) 5 MG TABS tablet Take 2 tablets (10 mg total) by mouth 2 (two) times daily for 6 days, THEN 1 tablet (5 mg total) 2 (two) times daily for 30 days. 07/28/18 09/02/18  Amin, Loura Halt, MD  Cholecalciferol (VITAMIN D-3 PO) Take 1,000 Int'l Units by mouth.    [provider]  QUEtiapine (SEROQUEL) 25 MG tablet Take 1 tablet (25 mg total) by mouth 2 (two) times daily. 01/31/17   Corwin Levins, MD  simvastatin (ZOCOR) 20 MG tablet TAKE 1 TABLET BY MOUTH  DAILY 09/18/18   Corwin Levins, MD  Specialty Vitamins Products (ECHINACEA C COMPLETE PO) Take 1 tablet by mouth daily.    [provider]  traMADol (ULTRAM) 50 MG tablet TAKE  1 TABLET BY MOUTH EVERY 6 HOURS Patient taking differently: Take 50 mg by mouth every 6 (six) hours as needed for moderate pain.  01/09/18   Corwin Levins, MD  triamterene-hydrochlorothiazide [facility]) 37.5-25 MG tablet TAKE ONE-HALF TABLET BY  MOUTH DAILY 06/19/18   Corwin Levins, MD  Turmeric 500 MG TABS Take 500 mg by mouth daily.    [provider]    Allergies  Allergen Reactions   Aspirin Other (See Comments)    Has diverticulitis, caused bleeding.   Advil [Ibuprofen] Swelling    "tongue swelling"   Atorvastatin    Indomethacin     REACTION: gi upset   Morphine And Related Swelling    Swelling in mouth   Penicillins     Sulfa Antibiotics Itching    Family History  Problem Relation Age of Onset   Heart Problems Mother    Osteoarthritis Mother    Heart Problems Father    Osteoarthritis Father    Heart Problems Brother    Osteoarthritis Brother    Cancer Other     Social History Social History   Tobacco Use   Smoking status: Current Every Day Smoker    Packs/day: 0.50    Types: Cigarettes   Smokeless tobacco: Current User  Substance Use Topics   Alcohol use: No   Drug use: No    Review of Systems Unable to obtain an adequate/accurate review of systems secondary to altered mental status.  ____________________________________________   PHYSICAL EXAM:  VITAL SIGNS: ED Triage Vitals [10/06/20 1358]  Enc Vitals Group     BP 107/64     Pulse Rate 80     Resp (!) 25     Temp      Temp src      SpO2 94 %     Weight 154 lb 5.2 oz (70 kg)     Height 5\' 6"  (1.676 m)     Head Circumference      Peak Flow      Pain Score      Pain Loc      Pain Edu?      Excl. in GC?    Constitutional: Alert and oriented. Well appearing and in no distress. Eyes: Normal exam ENT      Head: Normocephalic and atraumatic.      Mouth/Throat: Mucous membranes are moist. Cardiovascular: Normal rate, regular rhythm.  Respiratory: Normal respiratory effort without tachypnea nor retractions. Breath sounds are clear  Gastrointestinal: Soft and nontender. No distention.  Musculoskeletal: Nontender with normal range of motion in all extremities.  Neurologic:  Normal speech and language. No gross focal neurologic deficits  Skin:  Skin is warm, dry and intact.  Psychiatric: Mood and affect are normal.   ____________________________________________    RADIOLOGY  Chest x-ray is negative.  ____________________________________________   INITIAL IMPRESSION / ASSESSMENT AND PLAN / ED COURSE  Pertinent labs & imaging results that were available during my care of the patient were reviewed by me and  considered in my medical decision making (see chart for details).   Patient presents to the emergency department for altered mental state described as weakness/somnolence.  Here the patient is very somnolent, will awaken to mild physical stimuli or strong verbal stimuli, will mumble unintelligible words before falling back asleep.  Does not appear to be in any distress or discomfort.  We will check labs, CT scan head, IV hydrate obtain a urine sample and continue to closely monitor.   E Walters was evaluated in Emergency Department on 10/06/2020 for the symptoms described in the history of present illness. She was evaluated in the context of the global COVID-19 pandemic, which necessitated consideration that the patient might be at risk for infection with the SARS-CoV-2 virus that causes COVID-19. Institutional protocols and algorithms that pertain to the evaluation of patients at risk for COVID-19 are in a state of rapid change based on information released by regulatory bodies including the CDC and federal and state organizations. These policies and algorithms were followed during the patient's care in the ED.  ____________________________________________   FINAL CLINICAL IMPRESSION(S) / ED DIAGNOSES  Altered mental status   Minna Antis, MD 10/09/20 2053

## 2020-10-06 NOTE — ED Triage Notes (Signed)
Patient coming from [facility] for AMS for 1 day. EMS and Staff unable to provide information about patients baseline. Patient disoriented and lethargic on arrival to ER. Vitals within normal limits.

## 2020-10-06 NOTE — Progress Notes (Signed)
[facility] Room ED14 Civil engineer, contracting [facility]) Hospital Liaison Hospitalized Hospice patient RN note:  Sydney Lowery is a current hospice patient with a terminal diagnosis of Hypertensive Stage V Chronic Kidney Disease. She was at [facility] for a respite stay while her daughter, Sydney Lowery had surgery. At baseline, she alternates between A&O x 2 and lethargy. She is bedbound with a Stage 2 pressure ulcer on her sacrum. Her diet is a soft diet with pills crushed. Family and the Hospice Home had noticed a decline in her mental status. She was brought to [facility] via EMS because patient is a Full Code.  Visited patient in ED. She was not responsive to verbal or painful stimuli. Spoke with ED MD. Plan is to stay overnight. As of 1730 patient has not been admitted yet.  Vital signs: BP 85/53; HR 80; Resp 25  Abnormal labs: Chloride: 95 (L) Glucose: 108 (H) BUN: 27 (H) Creatinine: 1.06 (H) Albumin: 2.6 (L) GFR, Estimated: 49 (L) WBC: 12.8 (H) RBC: 3.50 (L) Hemoglobin: 9.7 (L) HCT: 33.4 (L) MCHC: 29.0 (L) RDW: 17.2 (H) NEUT#: 10.2 (H) Abs Immature Granulocytes: 0.08 (H)  Awaiting results of urinalysis.  Please call with any hospice related question or concerns.  Cyndra Numbers, RN [facility] Liaison [phone]

## 2020-10-06 NOTE — H&P (Signed)
History and Physical   [facility] - [place] @ [facility] Admission History and Physical [employer], D.O.    Patient Name: Sydney Lowery MR#: [id number] Date of Birth: 1923/12/06 Date of Admission: 10/06/2020  Referring MD/NP/PA: Dr. Lenard Lance Primary Care Physician: Corwin Levins, MD  Chief Complaint:  Chief Complaint  Patient presents with  . Altered Mental Status  CAVEAT:  Patient unable to provide history due to AMS.   Majority of the history was obtained from ER physician and patient records.    HPI: Sydney Lowery is a 87 y.o. female with a known history of allergies, arthritis, HTN, HLD,  Psychosis currently lives at home with daughter and is on home hospice presents to the emergency department for evaluation of AMS for the past day.  Patient was in a usual state of health until today when she was noted to be more somnolent than usual.   EMS/ED Course: Patient received Rocephin, NS. Medical admission has been requested for further management of AMS likely 2/2 UTI.  Review of Systems:  Unable to obtain 2/2 AMS   Past Medical History:  Diagnosis Date  . Allergic rhinitis 03/27/2008   Qualifier: Diagnosis of  By: Jonny Ruiz MD, Len Blalock   . ANEMIA-NOS 06/25/2009   Qualifier: Diagnosis of  By: Ewing CMA (AAMA), Robin    . Arthritis   . Bilateral foot-drop 04/23/2015  . Chronic low back pain 04/23/2015  . Essential hypertension 02/22/2007   Qualifier: Diagnosis of  By: Briscoe Burns CMA, Alvy Beal    . GOUT 09/23/2009   Qualifier: Diagnosis of  By: Jonny Ruiz MD, Len Blalock   . Hyperlipidemia 02/22/2007   Qualifier: Diagnosis of  By: Briscoe Burns CMA, Alvy Beal    . Hypertension   . OSTEOPENIA 03/27/2008   Qualifier: Diagnosis of  By: Jonny Ruiz MD, Len Blalock   . Psychosis (HCC) 01/21/2017    Past Surgical History:  Procedure Laterality Date  . arm fracture Left 1967   Fx. in 3 places and had a dropped wrist     reports that she has been smoking cigarettes. She has been smoking about 0.50 packs per day.  She uses smokeless tobacco. She reports that she does not drink alcohol and does not use drugs.  Allergies  Allergen Reactions  . Aspirin Other (See Comments)    Has diverticulitis, caused bleeding.  . Advil [Ibuprofen] Swelling    "tongue swelling"  . Atorvastatin   . Indomethacin     REACTION: gi upset  . Morphine And Related Swelling    Swelling in mouth  . Penicillins   . Sulfa Antibiotics Itching    Family History  Problem Relation Age of Onset  . Heart Problems Mother   . Osteoarthritis Mother   . Heart Problems Father   . Osteoarthritis Father   . Heart Problems Brother   . Osteoarthritis Brother   . Cancer Other     Prior to Admission medications   Medication Sig Start Date End Date Taking? Authorizing Provider  amLODipine (NORVASC) 5 MG tablet TAKE 1 TABLET BY MOUTH  DAILY 09/18/18   Corwin Levins, MD  apixaban (ELIQUIS) 5 MG TABS tablet Take 2 tablets (10 mg total) by mouth 2 (two) times daily for 6 days, THEN 1 tablet (5 mg total) 2 (two) times daily for 30 days. 07/28/18 09/02/18  Amin, Loura Halt, MD  Cholecalciferol (VITAMIN D-3 PO) Take 1,000 Int'l Units by mouth.    [provider]  QUEtiapine (SEROQUEL) 25 MG tablet Take 1  tablet (25 mg total) by mouth 2 (two) times daily. 01/31/17   Corwin Levins, MD  simvastatin (ZOCOR) 20 MG tablet TAKE 1 TABLET BY MOUTH  DAILY 09/18/18   Corwin Levins, MD  Specialty Vitamins Products (ECHINACEA C COMPLETE PO) Take 1 tablet by mouth daily.    [provider]  traMADol (ULTRAM) 50 MG tablet TAKE 1 TABLET BY MOUTH EVERY 6 HOURS Patient taking differently: Take 50 mg by mouth every 6 (six) hours as needed for moderate pain.  01/09/18   Corwin Levins, MD  triamterene-hydrochlorothiazide [facility]) 37.5-25 MG tablet TAKE ONE-HALF TABLET BY  MOUTH DAILY 06/19/18   Corwin Levins, MD  Turmeric 500 MG TABS Take 500 mg by mouth daily.    [provider]    Physical Exam: Vitals:   10/06/20 1751 10/06/20  1800 10/06/20 1830 10/06/20 1930  BP: 106/64 103/62 (!) 90/59 (!) 88/56  Pulse: 70 71 73 77  Resp: (!) 23 (!) 23 19 (!) 22  Temp:      TempSrc:      SpO2: 100% 98% 99% 99%  Weight:      Height:        GENERAL: 87 y.o.-year-old female patient, chronically ill appearing, lying in the bed in no acute distress.  Pleasant and cooperative.   HEENT: Head atraumatic, normocephalic. Pupils equal. Mucus membranes moist. NECK: Supple. No JVD. CHEST: Normal breath sounds bilaterally. No wheezing, rales, rhonchi or crackles. No use of accessory muscles of respiration.   CARDIOVASCULAR: S1, S2 normal. No murmurs, rubs, or gallops. Cap refill <2 seconds. Pulses intact distally.  ABDOMEN: Soft, nondistended, nontender. No rebound, guarding, rigidity. Normoactive bowel sounds present in all four quadrants.  EXTREMITIES: No pedal edema, cyanosis, or clubbing. No calf tenderness or Homan's sign.  NEUROLOGIC: The patient is arousable but does not converse PSYCHIATRIC:  Normal affect, mood, thought content. SKIN: Warm, dry, and intact without obvious rash, lesion, or ulcer.    Labs on Admission:  CBC: Recent Labs  Lab 10/06/20 1453  WBC 12.8*  NEUTROABS 10.2*  HGB 9.7*  HCT 33.4*  MCV 95.4  PLT 300   Basic Metabolic Panel: Recent Labs  Lab 10/06/20 1453  NA 135  K 3.9  CL 95*  CO2 31  GLUCOSE 108*  BUN 27*  CREATININE 1.06*  CALCIUM 8.9   GFR: Estimated Creatinine Clearance: 30.4 mL/min (A) (by C-G formula based on SCr of 1.06 mg/dL (H)). Liver Function Tests: Recent Labs  Lab 10/06/20 1453  AST 16  ALT 9  ALKPHOS 61  BILITOT 0.9  PROT 6.9  ALBUMIN 2.6*   No results for input(s): LIPASE, AMYLASE in the last 168 hours. No results for input(s): AMMONIA in the last 168 hours. Coagulation Profile: Recent Labs  Lab 10/06/20 1453  INR 1.2   Cardiac Enzymes: No results for input(s): CKTOTAL, CKMB, CKMBINDEX, TROPONINI in the last 168 hours. BNP (last 3 results) No results  for input(s): PROBNP in the last 8760 hours. HbA1C: No results for input(s): HGBA1C in the last 72 hours. CBG: No results for input(s): GLUCAP in the last 168 hours. Lipid Profile: No results for input(s): CHOL, HDL, LDLCALC, TRIG, CHOLHDL, LDLDIRECT in the last 72 hours. Thyroid Function Tests: No results for input(s): TSH, T4TOTAL, FREET4, T3FREE, THYROIDAB in the last 72 hours. Anemia Panel: No results for input(s): VITAMINB12, FOLATE, FERRITIN, TIBC, IRON, RETICCTPCT in the last 72 hours. Urine analysis:    Component Value Date/Time   COLORURINE AMBER (  A) 10/06/2020 1548   APPEARANCEUR TURBID (A) 10/06/2020 1548   LABSPEC 1.017 10/06/2020 1548   PHURINE 7.0 10/06/2020 1548   GLUCOSEU NEGATIVE 10/06/2020 1548   GLUCOSEU NEGATIVE 03/04/2017 1320   HGBUR SMALL (A) 10/06/2020 1548   BILIRUBINUR NEGATIVE 10/06/2020 1548   KETONESUR NEGATIVE 10/06/2020 1548   PROTEINUR 100 (A) 10/06/2020 1548   UROBILINOGEN 0.2 03/04/2017 1320   NITRITE NEGATIVE 10/06/2020 1548   LEUKOCYTESUR MODERATE (A) 10/06/2020 1548   Sepsis Labs: @LABRCNTIP (procalcitonin:4,lacticidven:4) ) Recent Results (from the past 240 hour(s))  Resp Panel by RT-PCR (Flu A&B, Covid) Nasopharyngeal Swab     Status: None   Collection Time: 10/06/20  3:48 PM   Specimen: Nasopharyngeal Swab; Nasopharyngeal(NP) swabs in vial transport medium  Result Value Ref Range Status   SARS Coronavirus 2 by RT PCR NEGATIVE NEGATIVE Final    Comment: (NOTE) SARS-CoV-2 target nucleic acids are NOT DETECTED.  The SARS-CoV-2 RNA is generally detectable in upper respiratory specimens during the acute phase of infection. The lowest concentration of SARS-CoV-2 viral copies this assay can detect is 138 copies/mL. A negative result does not preclude SARS-Cov-2 infection and should not be used as the sole basis for treatment or other patient management decisions. A negative result may occur with  improper specimen collection/handling,  submission of specimen other than nasopharyngeal swab, presence of viral mutation(s) within the areas targeted by this assay, and inadequate number of viral copies(<138 copies/mL). A negative result must be combined with clinical observations, patient history, and epidemiological information. The expected result is Negative.  Fact Sheet for Patients:  12/06/20  Fact Sheet for Healthcare Providers:  BloggerCourse.com  This test is no t yet approved or cleared by the [employer] FDA and  has been authorized for detection and/or diagnosis of SARS-CoV-2 by FDA under an Emergency Use Authorization (EUA). This EUA will remain  in effect (meaning this test can be used) for the duration of the COVID-19 declaration under Section 564(b)(1) of the Act, 21 U.S.C.section 360bbb-3(b)(1), unless the authorization is terminated  or revoked sooner.       Influenza A by PCR NEGATIVE NEGATIVE Final   Influenza B by PCR NEGATIVE NEGATIVE Final    Comment: (NOTE) The Xpert Xpress SARS-CoV-2/FLU/RSV plus assay is intended as an aid in the diagnosis of influenza from Nasopharyngeal swab specimens and should not be used as a sole basis for treatment. Nasal washings and aspirates are unacceptable for Xpert Xpress SARS-CoV-2/FLU/RSV testing.  Fact Sheet for Patients: [place]  Fact Sheet for Healthcare Providers: BloggerCourse.com  This test is not yet approved or cleared by the [employer]ious[employer] FDA and has been authorized for detection and/or diagnosis of SARS-CoV-2 by FDA under an Emergency Use Authorization (EUA). This EUA will remain in effect (meaning this test can be used) for the duration of the COVID-19 declaration under Section 564(b)(1) of the Act, 21 U.S.C. section 360bbb-3(b)(1), unless the authorization is terminated or revoked.  Performed at [facility], [address] [place], [place]      Radiological Exams on Admission: DG [facility] 1 View  Result Date: 10/06/2020 CLINICAL DATA:  Altered mental status x1 day, possible sepsis EXAM: PORTABLE CHEST 1 VIEW COMPARISON:  None. FINDINGS: Lungs are clear.  No pleural effusion or pneumothorax. Cardiomegaly.  Thoracic aortic atherosclerosis. IMPRESSION: No evidence of acute cardiopulmonary disease. Electronically Signed   By: 12/06/2020 M.D.   On: 10/06/2020 15:02    Assessment/Plan  This is a 87 y.o. female  with a history of allergies, arthritis, HTN, HLD,  Psychosis now being admitted with:  #. AMS likely 2/2 UTI - Admit to inpatient with telemetry monitoring - IV antibiotics: Rocephin - IV fluid hydration - Follow up blood,urine & sputum cultures - Neurochecks - Repeat CBC in am.   #. Mild AKI - Gentle IVF hydration  #. Normocytic anemia - Check FOBT, iron, B12, folate  #. History of HTN - Continue amlodipine, maxzide  #. History of HLD - Continue Zocor  #. History of psychosis - Continue Seroquel  Admission status: IP, tele IV Fluids: NS Diet/Nutrition: NPO Consults called: None  DVT Px: Eliquisand early ambulation. Code Status: Full Code, hospice involved Disposition Plan: To home in 1-2 days  All the records are reviewed and case discussed with ED provider. Management plans discussed with the patient and/or family who express understanding and agree with plan of care.  Sydney Lowery D.O. on 10/06/2020 at 7:55 PM CC: Primary care physician; Corwin LevinsJohn, James W, MD   10/06/2020, 7:55 PM

## 2020-10-06 NOTE — ED Notes (Signed)
Phlebotomy at bedside for [facility] draw

## 2020-10-07 ENCOUNTER — Inpatient Hospital Stay

## 2020-10-07 DIAGNOSIS — R4182 Altered mental status, unspecified: Secondary | ICD-10-CM

## 2020-10-07 LAB — COMPREHENSIVE METABOLIC PANEL
ALT: 9 U/L (ref 0–44)
AST: 17 U/L (ref 15–41)
Albumin: 2.4 g/dL — ABNORMAL LOW (ref 3.5–5.0)
Alkaline Phosphatase: 70 U/L (ref 38–126)
Anion gap: 9 (ref 5–15)
BUN: 25 mg/dL — ABNORMAL HIGH (ref 8–23)
CO2: 29 mmol/L (ref 22–32)
Calcium: 8.6 mg/dL — ABNORMAL LOW (ref 8.9–10.3)
Chloride: 102 mmol/L (ref 98–111)
Creatinine, Ser: 0.94 mg/dL (ref 0.44–1.00)
GFR, Estimated: 56 mL/min — ABNORMAL LOW (ref >60)
Glucose, Bld: 104 mg/dL — ABNORMAL HIGH (ref 70–99)
Potassium: 3.9 mmol/L (ref 3.5–5.1)
Sodium: 140 mmol/L (ref 135–145)
Total Bilirubin: 0.8 mg/dL (ref 0.3–1.2)
Total Protein: 6.6 g/dL (ref 6.5–8.1)

## 2020-10-07 LAB — CBC
HCT: 32.9 % — ABNORMAL LOW (ref 36.0–46.0)
Hemoglobin: 9.7 g/dL — ABNORMAL LOW (ref 12.0–15.0)
MCH: 28.2 pg (ref 26.0–34.0)
MCHC: 29.5 g/dL — ABNORMAL LOW (ref 30.0–36.0)
MCV: 95.6 fL (ref 80.0–100.0)
Platelets: 312 10*3/uL (ref 150–400)
RBC: 3.44 MIL/uL — ABNORMAL LOW (ref 3.87–5.11)
RDW: 17 % — ABNORMAL HIGH (ref 11.5–15.5)
WBC: 12.5 10*3/uL — ABNORMAL HIGH (ref 4.0–10.5)
nRBC: 0 % (ref 0.0–0.2)

## 2020-10-07 LAB — VITAMIN B12: Vitamin B-12: 227 pg/mL (ref 180–914)

## 2020-10-07 MED ORDER — FREE WATER: 100.0000 mL | Freq: Four times a day (QID) | Status: DC

## 2020-10-07 MED ORDER — GUAIFENESIN ER 600 MG PO TB12
600.0000 mg | ORAL_TABLET | Freq: Two times a day (BID) | ORAL | Status: DC
  Administered 2020-10-07 – 2020-10-08 (×3): 600 mg via ORAL
  Filled 2020-10-07 (×3): qty 1

## 2020-10-07 MED ORDER — ENOXAPARIN SODIUM 40 MG/0.4ML ~~LOC~~ SOLN
40.0000 mg | SUBCUTANEOUS | Status: DC
  Administered 2020-10-07: 22:00:00 40 mg via SUBCUTANEOUS
  Filled 2020-10-07: qty 0.4

## 2020-10-07 NOTE — Consult Note (Signed)
WOC Nurse Consult Note: Reason for Consult: Pressure injuries to bilateral buttocks, Stage 2.  POA. Wound type: Pressure plus moisture Pressure Injury POA: Yes Measurement: To be obtained by Bedside RN today and documented on the Nursing Flow Sheet Wound bed: 100% pink, moist Drainage (amount, consistency, odor) Scant serous Periwound: Intact, dry Dressing procedure/placement/frequency: Stage 2 pressure injury interventions are part of a skin care protocol and may be implemented by Nursing. As I was consulted, I have entered in guidance via the Orders for turning and repositioning to avoid the affected area, floatation of her intact heels, and topical care consisting of an antimicrobial nonadherent wound contact layer (xeroform) topped with dry gauze and covered with a silicone form bordered dressing.   WOC nursing team will not follow, but will remain available to this patient, the nursing and medical teams.  Please re-consult if needed. Thanks, Ladona Mow, MSN, RN, GNP, [place]  Pager# [phone]

## 2020-10-07 NOTE — Progress Notes (Addendum)
[facility]  PROGRESS NOTE  Sydney Lowery RJJ:[id number] DOB: [birth year] DOA: 10/06/2020 PCP: Corwin Levins, MD   Brief HPI:   87 year old female with history of allergies, arthritis, hypertension, hyperlipidemia who lives at home with daughter and is on home hospice was brought to the ED for altered mental status of 1 day duration.  She was found to have abnormal UA and started on antibiotics.    Subjective   Patient seen and examined, she is oriented to self and place only.  Denies any complaints.  She was seen by speech therapy this morning and started on dysphagia 1 diet with thin liquid.   Assessment/Plan:     1. Altered mental status due to UTI-patient has abnormal UA, started on IV Rocephin for UTI.  Mental status has improved. 2. UTI-as above patient has abnormal UA, started on IV Rocephin.  Follow urine culture results. 3. Acute kidney injury-patient presented with mild AKI with creatinine 1.06, she was started on IV LR at 100 mL/h.  Creatinine is down to 0.94.  Patient is able to take by mouth.  Will change IV fluids to [facility] as she has developed mild pulmonary vascular congestion. 4. History of lower extremity DVT-patient was started on Eliquis in January 2020, however she was taken off Eliquis in November 2021 as per daughter due to hematuria.  Will discontinue Eliquis. 5. History of hypertension-blood pressure is soft in the hospital.  Will hold amlodipine, Maxzide. 6. History of hyperlipidemia-continue Zocor 7. History of psychosis-continue Seroquel    Scheduled medications:   . guaiFENesin  600 mg Oral BID  . QUEtiapine  25 mg Oral BID  . simvastatin  20 mg Oral QHS         Data Reviewed:   CBG:  No results for input(s): GLUCAP in the last 168 hours.  SpO2: 95 % O2 Flow Rate (L/min): 2 L/min    Vitals:   10/07/20 0342 10/07/20 0735 10/07/20 1010 10/07/20 1133  BP: 97/63 92/67 93/66  (!) 98/58  Pulse: 86 87 90 91  Resp: 20 20  16   Temp: 97.8 F  (36.6 C) 99.5 F (37.5 C)  99 F (37.2 C)  TempSrc:      SpO2: 100% 95% 100% 95%  Weight:      Height:         Intake/Output Summary (Last 24 hours) at 10/07/2020 1320 Last data filed at 10/07/2020 1100 Gross per 24 hour  Intake --  Output 100 ml  Net -100 ml    No intake/output data recorded.  Filed Weights   10/06/20 1358  Weight: 70 kg    CBC:  Recent Labs  Lab 10/06/20 1453 10/07/20 0406  WBC 12.8* 12.5*  HGB 9.7* 9.7*  HCT 33.4* 32.9*  PLT 300 312  MCV 95.4 95.6  MCH 27.7 28.2  MCHC 29.0* 29.5*  RDW 17.2* 17.0*  LYMPHSABS 1.4  --   MONOABS 1.0  --   EOSABS 0.0  --   BASOSABS 0.0  --     Complete metabolic panel:  Recent Labs  Lab 10/06/20 1427 10/06/20 1453 10/06/20 1627 10/07/20 0406  NA  --  135  --  140  K  --  3.9  --  3.9  CL  --  95*  --  102  CO2  --  31  --  29  GLUCOSE  --  108*  --  104*  BUN  --  27*  --  25*  CREATININE  --  1.06*  --  0.94  CALCIUM  --  8.9  --  8.6*  AST  --  16  --  17  ALT  --  9  --  9  ALKPHOS  --  61  --  70  BILITOT  --  0.9  --  0.8  ALBUMIN  --  2.6*  --  2.4*  LATICACIDVEN 2.0*  --  1.6  --   INR  --  1.2  --   --     No results for input(s): LIPASE, AMYLASE in the last 168 hours.  Recent Labs  Lab 10/06/20 1548  SARSCOV2NAA NEGATIVE    ------------------------------------------------------------------------------------------------------------------ No results for input(s): CHOL, HDL, LDLCALC, TRIG, CHOLHDL, LDLDIRECT in the last 72 hours.  Lab Results  Component Value Date   HGBA1C  05/20/2009    6.0 (NOTE) The ADA recommends the following therapeutic goal for glycemic control related to Hgb A1c measurement: Goal of therapy: <6.5 Hgb A1c  Reference: American Diabetes Association: Clinical Practice Recommendations 2010, Diabetes Care, 2010, 33: (Suppl  1).   ------------------------------------------------------------------------------------------------------------------ No results for  input(s): TSH, T4TOTAL, T3FREE, THYROIDAB in the last 72 hours.  Invalid input(s): FREET3 ------------------------------------------------------------------------------------------------------------------ Recent Labs    10/06/20 1757 10/07/20 0406  VITAMINB12  --  227  FOLATE 9.2  --   FERRITIN 234  --   TIBC 174*  --   IRON 16*  --     Coagulation profile  Recent Labs  Lab 10/06/20 1453  INR 1.2    No results for input(s): DDIMER in the last 72 hours.  Cardiac Enzymes  No results for input(s): CKMB, TROPONINI, MYOGLOBIN in the last 168 hours.  Invalid input(s): CK ------------------------------------------------------------------------------------------------------------------ No results found for: BNP   Antibiotics: Anti-infectives (From admission, onward)   Start     Dose/Rate Route Frequency Ordered Stop   10/07/20 1800  cefTRIAXone (ROCEPHIN) 1 g in sodium chloride 0.9 % 100 mL IVPB        1 g 200 mL/hr over 30 Minutes Intravenous Every 24 hours 10/06/20 2007     10/06/20 1800  cefTRIAXone (ROCEPHIN) 1 g in sodium chloride 0.9 % 100 mL IVPB        1 g 200 mL/hr over 30 Minutes Intravenous  Once 10/06/20 1747 10/06/20 1952       Radiology Reports  CT HEAD WO CONTRAST  Result Date: 10/07/2020 CLINICAL DATA:  Altered mental status EXAM: CT HEAD WITHOUT CONTRAST TECHNIQUE: Contiguous axial images were obtained from the base of the skull through the vertex without intravenous contrast. COMPARISON:  None. FINDINGS: Brain: There is moderate diffuse atrophy. There is no intracranial mass, hemorrhage, extra-axial fluid collection, or midline shift. There is mild decreased attenuation in the centra semiovale bilaterally. No acute appearing infarct is evident. Vascular: No hyperdense vessel. There is calcification in each carotid siphon region. Skull: Bones are hyperostotic.  Bony calvarium appears intact. Sinuses/Orbits: There is mucosal thickening in the posterior right  sphenoid region. There is mucosal thickening in several ethmoid air cells. Visualized orbits appear grossly symmetric bilaterally. Other: Visualized mastoid air cells are clear. There is debris in the left external auditory canal. IMPRESSION: Atrophy with relatively mild periventricular small vessel disease. No acute infarct. No mass or hemorrhage. Foci of arterial vascular calcification noted. Areas of paranasal sinus disease noted. Probable cerumen in the left external auditory canal. Electronically Signed   By: Bretta Bang III M.D.   On: 10/07/2020 11:11   DG Chest Port 1  View  Result Date: 10/06/2020 CLINICAL DATA:  Altered mental status x1 day, possible sepsis EXAM: PORTABLE CHEST 1 VIEW COMPARISON:  None. FINDINGS: Lungs are clear.  No pleural effusion or pneumothorax. Cardiomegaly.  Thoracic aortic atherosclerosis. IMPRESSION: No evidence of acute cardiopulmonary disease. Electronically Signed   By: Charline BillsSriyesh  Krishnan M.D.   On: 10/06/2020 15:02      DVT prophylaxis: Lovenox  Code Status: Full code  Family Communication: Discussed with patient's daughter on phone   Consultants:    Procedures:      Objective    Physical Examination:    General: Appears in no acute distress  Cardiovascular: S1-S2, regular, no murmur auscultated  Respiratory: Clear to auscultation bilaterally  Abdomen: Abdomen is soft, nontender, no organomegaly  Extremities: No edema in lower extremities  Neurologic: Alert, oriented to self and place only   Status is: Inpatient  Dispo: The patient is from: Home              Anticipated d/c is to: Home              Anticipated d/c date is: 10/09/2020              Patient currently not stable for discharge  Barrier to discharge-altered mental status, UTI  COVID-19 Labs  Recent Labs    10/06/20 1757  FERRITIN 234    Lab Results  Component Value Date   SARSCOV2NAA NEGATIVE 10/06/2020    Microbiology  Recent Results (from the  past 240 hour(s))  Blood culture (routine single)     Status: None (Preliminary result)   Collection Time: 10/06/20  2:53 PM   Specimen: BLOOD  Result Value Ref Range Status   Specimen Description BLOOD BLOOD RIGHT FOREARM  Final   Special Requests   Final    BOTTLES DRAWN AEROBIC AND ANAEROBIC Blood Culture results may not be optimal due to an inadequate volume of blood received in culture bottles   Culture   Final    NO GROWTH < 12 HOURS Performed at [facility], [address] [place], [place] [address]    Report Status PENDING  Incomplete  Resp Panel by RT-PCR (Flu A&B, Covid) Nasopharyngeal Swab     Status: None   Collection Time: 10/06/20  3:48 PM   Specimen: Nasopharyngeal Swab; Nasopharyngeal(NP) swabs in vial transport medium  Result Value Ref Range Status   SARS Coronavirus 2 by RT PCR NEGATIVE NEGATIVE Final    Comment: (NOTE) SARS-CoV-2 target nucleic acids are NOT DETECTED.  The SARS-CoV-2 RNA is generally detectable in upper respiratory specimens during the acute phase of infection. The lowest concentration of SARS-CoV-2 viral copies this assay can detect is 138 copies/mL. A negative result does not preclude SARS-Cov-2 infection and should not be used as the sole basis for treatment or other patient management decisions. A negative result may occur with  improper specimen collection/handling, submission of specimen other than nasopharyngeal swab, presence of viral mutation(s) within the areas targeted by this assay, and inadequate number of viral copies(<138 copies/mL). A negative result must be combined with clinical observations, patient history, and epidemiological information. The expected result is Negative.  Fact Sheet for Patients:  BloggerCourse.comhttps://www.fda.gov/media/152166/download  Fact Sheet for Healthcare Providers:  SeriousBroker.ithttps://www.fda.gov/media/152162/download  This test is no t yet approved or cleared by the [place] FDA and  has been  authorized for detection and/or diagnosis of SARS-CoV-2 by FDA under an Emergency Use Authorization (EUA). This EUA will remain  in effect (meaning this  test can be used) for the duration of the COVID-19 declaration under Section 564(b)(1) of the Act, 21 U.S.C.section 360bbb-3(b)(1), unless the authorization is terminated  or revoked sooner.       Influenza A by PCR NEGATIVE NEGATIVE Final   Influenza B by PCR NEGATIVE NEGATIVE Final    Comment: (NOTE) The Xpert Xpress SARS-CoV-2/FLU/RSV plus assay is intended as an aid in the diagnosis of influenza from Nasopharyngeal swab specimens and should not be used as a sole basis for treatment. Nasal washings and aspirates are unacceptable for Xpert Xpress SARS-CoV-2/FLU/RSV testing.  Fact Sheet for Patients: BloggerCourse.com  Fact Sheet for Healthcare Providers: SeriousBroker.it  This test is not yet approved or cleared by the [place] FDA and has been authorized for detection and/or diagnosis of SARS-CoV-2 by FDA under an Emergency Use Authorization (EUA). This EUA will remain in effect (meaning this test can be used) for the duration of the COVID-19 declaration under Section 564(b)(1) of the Act, 21 U.S.C. section 360bbb-3(b)(1), unless the authorization is terminated or revoked.  Performed at [facility], [address] [place], [place] [address]     Pressure Injury 10/06/20 Buttocks Left Stage 2 -  Partial thickness loss of dermis presenting as a shallow open injury with a red, pink wound bed without slough. Stage 2 on left buttocks (Active)  10/06/20 2335  Location: Buttocks  Location Orientation: Left  Staging: Stage 2 -  Partial thickness loss of dermis presenting as a shallow open injury with a red, pink wound bed without slough.  Wound Description (Comments): Stage 2 on left buttocks  Present on Admission: Yes     Pressure Injury 10/06/20 Right Stage 2 -   Partial thickness loss of dermis presenting as a shallow open injury with a red, pink wound bed without slough. Stage 2 on right buttocks (Active)  10/06/20 2341  Location:   Location Orientation: Right  Staging: Stage 2 -  Partial thickness loss of dermis presenting as a shallow open injury with a red, pink wound bed without slough.  Wound Description (Comments): Stage 2 on right buttocks  Present on Admission: Yes          Lulamae Skorupski S Jasnoor Trussell   [facility] If 7PM-7AM, please contact night-coverage at [email or web address], Office  [phone]   10/07/2020, 1:20 PM  LOS: 1 day

## 2020-10-07 NOTE — Progress Notes (Signed)
[facility] Room 105 Civil engineer, contracting [facility]) Hospital Liaison Hospitalized hospice patient RN note:  Sydney Lowery is a current hospice patient with a terminal diagnosis of Hypertensive Stage V Chronic Kidney Disease. She was at [facility] for a respite stay while her daughter, Sydney Lowery had surgery. At baseline, she alternates between A&O x 2 and lethargy. She is bedbound with a Stage 2 pressure ulcer on her sacrum. Her diet is a soft diet with pills crushed. Family and the Hospice Home had noticed a decline in her mental status. She was brought to [facility] via EMS because patient is a Full Code. She was admitted to [facility] room 105 on 04.04 for altered mental status secondary to UTI. Per Dr. Dan Humphreys, with [facility], this is a related admission.  Visited with patient at bedside. She was more alert than yesterday. She was in bed and speaking with daughter, Sydney Lowery via phone. She appeared to speak and answer questions appropriately. Speech therapy present to do assessment. ACC RN spoke with daughter for a brief moment. She is recovering from surgery at the hospital and requests call back later. Report exchanged with hospital care team. Per speech recommendations, patient started on soft diet.  Vital Signs: BP 98/58; HR 91; Resp 16; Temp 99; O2 sat 95% on 2l [place]  I&O: none/189ml  Abnormal Labs: Glucose: 104 (H) BUN: 25 (H) Calcium: 8.6 (L) Albumin: 2.4 (L) GFR, Estimated: 56 (L) WBC: 12.5 (H) RBC: 3.44 (L) Hemoglobin: 9.7 (L) HCT: 32.9 (L) MCHC: 29.5 (L) RDW: 17.0 (H)  Diagnostics: CXR: IMPRESSION: No evidence of acute cardiopulmonary disease.  CT HEAD IMPRESSION: Atrophy with relatively mild periventricular small vessel disease. No acute infarct. No mass or hemorrhage.  Foci of arterial vascular calcification noted. Areas of paranasal sinus disease noted. Probable cerumen in the left external auditory Canal.  IV/PRN Meds: 0.9 % sodium chloride infusion Rate: 10 mL/hr Freq:  Continuous Route: IV Start: 10/06/20 2015  cefTRIAXone (ROCEPHIN) 1 g in sodium chloride 0.9 % 100 mL IVPB Dose: 1 g Freq: Every 24 hours Route: IV Start: 10/07/20 1800  sodium chloride 0.9 % bolus 500 mL Dose: 500 mL Freq: Once Route: IV Last Dose: Stopped (10/06/20 1637) Start: 10/06/20 1600 End: 10/06/20   Problem List: This is a 87 y.o. female with a history of allergies, arthritis, HTN, HLD,  Psychosis now being admitted with:  #. AMS likely 2/2 UTI - Admit to inpatient with telemetry monitoring - IV antibiotics: Rocephin - IV fluid hydration - Follow up blood,urine & sputum cultures - Neurochecks - Repeat CBC in am.   #. Mild AKI - Gentle IVF hydration  #. Normocytic anemia - Check FOBT, iron, B12, folate  #. History of HTN - Continue amlodipine, maxzide  #. History of HLD - Continue Zocor  #. History of psychosis - Continue Seroquel  Discharge Planning: Patient will discharge back to the Hospice Home when medically ready to complete Respite stay. Spoke with Sydney Lowery at the [facility] to confirm.  Family contact: Spoke with daughter, Sydney Lowery over the phone. Voiced concerns that her mother was NPO. Explained that speech eval was being done at that time.  IDG: Updated  Goals of care: Clear--Full Code  Medication list and Transfer Summary placed on Shadow Chart.  Please call with any hospice related questions or concerns.  Cyndra Numbers, RN [facility] Liaison [phone]

## 2020-10-07 NOTE — Evaluation (Signed)
Clinical/Bedside Swallow Evaluation Patient Details  Name: Sydney Lowery MRN: [id number] Date of Birth: 12-11-1923  Today's Date: 10/07/2020 Time: SLP Start Time (ACUTE ONLY): 0930 SLP Stop Time (ACUTE ONLY): 1030 SLP Time Calculation (min) (ACUTE ONLY): 60 min  Past Medical History:  Past Medical History:  Diagnosis Date  . Allergic rhinitis 03/27/2008   Qualifier: Diagnosis of  By: Jonny Ruiz MD, Len Blalock   . ANEMIA-NOS 06/25/2009   Qualifier: Diagnosis of  By: Ewing CMA (AAMA), Robin    . Arthritis   . Bilateral foot-drop 04/23/2015  . Chronic low back pain 04/23/2015  . Essential hypertension 02/22/2007   Qualifier: Diagnosis of  By: Briscoe Burns CMA, Alvy Beal    . GOUT 09/23/2009   Qualifier: Diagnosis of  By: Jonny Ruiz MD, Len Blalock   . Hyperlipidemia 02/22/2007   Qualifier: Diagnosis of  By: Briscoe Burns CMA, Alvy Beal    . Hypertension   . OSTEOPENIA 03/27/2008   Qualifier: Diagnosis of  By: Jonny Ruiz MD, Len Blalock   . Psychosis [facility]) 01/21/2017   Past Surgical History:  Past Surgical History:  Procedure Laterality Date  . arm fracture Left 1967   Fx. in 3 places and had a dropped wrist   HPI:  Pt is a 87 y.o. female with a known history of allergies, arthritis, HTN, HLD,  Psychosis currently lives at home with daughter and is on Home Hospice presents to the emergency department for evaluation of AMS -- AMS likely 2/2 UTI, AKI.  Patient was in a usual state of health until today when she was noted to be more somnolent than usual.  Pt is a current hospice patient with a terminal diagnosis of Hypertensive Stage V Chronic Kidney Disease. She was at [facility] for a respite stay while her daughter, [name] had surgery. At baseline, she alternates between A&O x 2 and lethargy. She is bedbound with a Stage 2 pressure ulcer on her sacrum. Her diet is a soft diet with pills crushed. Family and the Hospice Home had noticed a decline in her mental status in general.  Head CT: No acute infarct. No mass or hemorrhage.  Atrophy.  CXR: no acute disease.   Assessment / Plan / Recommendation Clinical Impression  Pt appears to present w/ oropharyngeal phase dysphagia in light of Declined Cognitive status; this has been noted at home per report. Declined Cognition impacts overall awareness/engagement and safety during po tasks which can increase risk for aspiration, choking as well as decreased oral intake impacting Hydration and Nutrition. Pt's risk for aspiration, as well as concern for meeting Nutritional needs fully, is present but can be reduced when following general aspiration precautions, Modifying diet consistency, and giving feeding assistance. She required Mod+ verbal/ visual cues for orientation to bolus presentation, and during feeding. Pt consumed trials of ice chip, purees, and thin liquids via Cup then mostly Straw w/ No immediate, overt clinical s/s of aspiration noted; no decline in vocal quality during few verbalizations, no cough, and no decline in respiratory status during/post trials. O2 sats remained 94-99%. Oral phase was c/b Min+ Confusion during oral prep stage but then grossly adequate bolus management and oral clearing of the boluses given.  NSG gave Pills in Puree which were tolerated well -- pt Crushes meds in Puree at baseline. Pinched straw delivery was utilized to limit Impulsive sucking and large boluses. OM Exam was cursory/observation, but No unilateral weakness noted. Pt's oral cavity was dry, sticky w/ residue on tongue. During oral care, she appeared min Confused  during oral care.  D/t pt's declined Cognition and her Edentulous status, recommend initiation of the dysphagia level 1(Puree) w/ Thin liquids; aspiration precautions; reduce Distractions during meals and only give po's when pt is fully awake. Pills Crushed in Puree for safer swallowing. Support w/ feeding at meals -- check for oral clearing during/post intake as needed. NSG updated. ST services recommends follow w/ Palliative Care for  GOC in general d/t age and declined Cognition which can impact overall oral intake for nutrition and hydration. Recommend Dietiican f/u as indicated. Precautions posted in room. This diet consistency is most beneficial for pt during admit and at Discharge. Hospice Liason informed as well. SLP Visit Diagnosis: Dysphagia, oral phase (R13.11) (declined Cognition)    Aspiration Risk  Mild aspiration risk;Risk for inadequate nutrition/hydration    Diet Recommendation  Dysphagia level 1 (PUREE) w/ gravies to moisten foods; Thin liquids -- monitor for Impulsive drinking and Pinch straw to limit large sips. Aspiration precautions. Feeding support at all meals. Reduce distractions at meals.  Medication Administration: Crushed with puree (baseline for safer swallowing)    Other  Recommendations Recommended Consults:  (Palliative Care for GOC; Dietician) Oral Care Recommendations: Oral care BID;Oral care before and after PO;Staff/trained caregiver to provide oral care Other Recommendations:  (n/a)   Follow up Recommendations None      Frequency and Duration  (n/a)   (n/a)       Prognosis Prognosis for Safe Diet Advancement: Fair Barriers to Reach Goals: Cognitive deficits;Time post onset;Severity of deficits (Edentulous status)      Swallow Study   General Date of Onset: 10/06/20 HPI: Pt is a 87 y.o. female with a known history of allergies, arthritis, HTN, HLD,  Psychosis currently lives at home with daughter and is on Home Hospice presents to the emergency department for evaluation of AMS -- AMS likely 2/2 UTI, AKI.  Patient was in a usual state of health until today when she was noted to be more somnolent than usual.  Pt is a current hospice patient with a terminal diagnosis of Hypertensive Stage V Chronic Kidney Disease. She was at [facility] for a respite stay while her daughter, [name] had surgery. At baseline, she alternates between A&O x 2 and lethargy. She is bedbound with a Stage 2  pressure ulcer on her sacrum. Her diet is a soft diet with pills crushed. Family and the Hospice Home had noticed a decline in her mental status in general.  Head CT: No acute infarct. No mass or hemorrhage. Atrophy.  CXR: no acute disease. Type of Study: Bedside Swallow Evaluation Previous Swallow Assessment: none noted Diet Prior to this Study: NPO (soft diet at home per report) Temperature Spikes Noted: No (wbc 12.5 declining) Respiratory Status: Nasal cannula (2L) History of Recent Intubation: No Behavior/Cognition: Alert;Cooperative;Pleasant mood;Confused;Distractible;Requires cueing Oral Cavity Assessment: Dry;Dried secretions (sticky; lingual dried secretions as seen w/ dehydration) Oral Care Completed by SLP: Yes Oral Cavity - Dentition: Edentulous Vision: Functional for self-feeding (adequate but confused -- does not appear that she feeds self) Self-Feeding Abilities: Total assist Patient Positioning: Upright in bed (needed full positioning) Baseline Vocal Quality: Low vocal intensity (muttered/mumbled speech) Volitional Cough: Cognitively unable to elicit (but noted cnogested cough at baseline) Volitional Swallow: Unable to elicit    Oral/Motor/Sensory Function Overall Oral Motor/Sensory Function: Within functional limits (during movemeents and oral clearing)   Ice Chips Ice chips: Within functional limits Presentation: Spoon (fed; 3 trials)   Thin Liquid Thin Liquid: Impaired (min) Presentation: Straw (  supported; ~3-4 ozs total) Oral Phase Impairments: Poor awareness of bolus (during prep stage) Oral Phase Functional Implications:  (none) Pharyngeal  Phase Impairments:  (none) Other Comments: confusion w/ self-feeding and prep stage    Nectar Thick Nectar Thick Liquid: Not tested   Honey Thick Honey Thick Liquid: Not tested   Puree Puree: Within functional limits (adequate) Presentation: Spoon (fed; 10+ trials) Other Comments: min oral prep deficits d/t decreased awareness  overally   Solid     Solid: Not tested Other Comments: edentulous        Jerilynn Som, MS, CCC-SLP Speech Language Pathologist Rehab Services [phone] Taima Rada 10/07/2020,2:39 PM

## 2020-10-07 NOTE — Progress Notes (Addendum)
Pt home meds secured with pharmacy. I put patient's daughter name, Butch Penny, as contact since patient is here with AMS. I put the home number that was listed for Butch Penny, however she is currently in the hospital at [facility] for surgery and the number she used to contact me for an update was [phone]. Just wanted to make note of both numbers since we are unsure how long patient's daughter will be in the hospital, so we have an additional contact number.

## 2020-10-08 DIAGNOSIS — L8990 Pressure ulcer of unspecified site, unspecified stage: Secondary | ICD-10-CM | POA: Insufficient documentation

## 2020-10-08 DIAGNOSIS — G9341 Metabolic encephalopathy: Secondary | ICD-10-CM

## 2020-10-08 LAB — BASIC METABOLIC PANEL
Anion gap: 10 (ref 5–15)
BUN: 20 mg/dL (ref 8–23)
CO2: 29 mmol/L (ref 22–32)
Calcium: 8.6 mg/dL — ABNORMAL LOW (ref 8.9–10.3)
Chloride: 97 mmol/L — ABNORMAL LOW (ref 98–111)
Creatinine, Ser: 0.72 mg/dL (ref 0.44–1.00)
GFR, Estimated: >60 mL/min (ref >60)
Glucose, Bld: 95 mg/dL (ref 70–99)
Potassium: 3.4 mmol/L — ABNORMAL LOW (ref 3.5–5.1)
Sodium: 136 mmol/L (ref 135–145)

## 2020-10-08 LAB — GLUCOSE, CAPILLARY
Glucose-Capillary: 93 mg/dL (ref 70–99)
Glucose-Capillary: 132 mg/dL — ABNORMAL HIGH (ref 70–99)
Glucose-Capillary: 87 mg/dL (ref 70–99)

## 2020-10-08 LAB — CBC
HCT: 31.3 % — ABNORMAL LOW (ref 36.0–46.0)
Hemoglobin: 9.3 g/dL — ABNORMAL LOW (ref 12.0–15.0)
MCH: 28.4 pg (ref 26.0–34.0)
MCHC: 29.7 g/dL — ABNORMAL LOW (ref 30.0–36.0)
MCV: 95.7 fL (ref 80.0–100.0)
Platelets: 307 10*3/uL (ref 150–400)
RBC: 3.27 MIL/uL — ABNORMAL LOW (ref 3.87–5.11)
RDW: 16.8 % — ABNORMAL HIGH (ref 11.5–15.5)
WBC: 10.4 10*3/uL (ref 4.0–10.5)
nRBC: 0 % (ref 0.0–0.2)

## 2020-10-08 MED ORDER — POLYSACCHARIDE IRON COMPLEX 150 MG PO CAPS
150.0000 mg | ORAL_CAPSULE | Freq: Every day | ORAL | Status: DC
  Administered 2020-10-08: 150 mg via ORAL
  Filled 2020-10-08: qty 1

## 2020-10-08 MED ORDER — CYANOCOBALAMIN 1000 MCG PO TABS: 1000.0000 ug | ORAL_TABLET | Freq: Every day | ORAL | 0 refills | Status: AC

## 2020-10-08 MED ORDER — POLYSACCHARIDE IRON COMPLEX 150 MG PO CAPS: 150.0000 mg | ORAL_CAPSULE | Freq: Every day | ORAL | 0 refills | Status: AC

## 2020-10-08 MED ORDER — COMBIVENT RESPIMAT 20-100 MCG/ACT IN AERS: 1.0000 | INHALATION_SPRAY | Freq: Four times a day (QID) | RESPIRATORY_TRACT | 0 refills | Status: AC

## 2020-10-08 MED ORDER — FOSFOMYCIN TROMETHAMINE 3 G PO PACK
3.0000 g | PACK | Freq: Once | ORAL | Status: AC
  Administered 2020-10-08: 11:00:00 3 g via ORAL
  Filled 2020-10-08: qty 3

## 2020-10-08 MED ORDER — POTASSIUM CHLORIDE 20 MEQ PO PACK
40.0000 meq | PACK | Freq: Once | ORAL | Status: AC
  Administered 2020-10-08: 10:00:00 40 meq via ORAL
  Filled 2020-10-08: qty 2

## 2020-10-08 MED ORDER — VITAMIN B-12 1000 MCG PO TABS
1000.0000 ug | ORAL_TABLET | Freq: Every day | ORAL | Status: DC
  Administered 2020-10-08: 10:00:00 1000 ug via ORAL
  Filled 2020-10-08: qty 1

## 2020-10-08 NOTE — Progress Notes (Signed)
[facility] Room 105 [facility]) Hospital Liaison Hospitalized Hospice patient RN note:  Visited with patient in room. She is alert and responsive to questions, some slurred speech but back to baseline. Spoke with daughter, [name] over the phone. Discussed that patient would be returning to the Hospice Home today. She is appreciative of call and has no questions. Discharge summary has been faxed to the Hospice Home and transport was arranged for 5pm.  Please call with any hospice related questions or concerns.  Cyndra Numbers, RN [facility] Liaison [phone]

## 2020-10-08 NOTE — Progress Notes (Signed)
Speech Language Pathology Treatment: Dysphagia  Patient Details Name: Sydney Lowery MRN: [id number] DOB: [birth year] Today's Date: 10/08/2020 Time: 0865-7846 SLP Time Calculation (min) (ACUTE ONLY): 30 min  Assessment / Plan / Recommendation Clinical Impression  Pt seen for ongoing assessment of swallowing and toleration of recommended diet consistency -- Dysphaiga level 1 (puree) w/ thins. Pt appears to present w/ oropharyngeal phase dysphagia in light of Declined Cognitive status; this has been noted at home per report. Declined Cognition impacts overall awareness/engagement and safety during po tasks which can increase risk for aspiration, choking as well as decreased oral intake impacting Hydration and Nutrition. Pt's risk for aspiration, as well as concern for meeting Nutritional needs fully, is present but can be reduced when following general aspiration precautions, Modifying diet consistency, and giving feeding assistance. She required Mod+ verbal/ visual cues for orientation to bolus presentation, and during feeding. Pt consumed few trials of ice chip, purees, and thin liquids via Straw w/ sip size monitored w/ No immediate, overt clinical s/s of aspiration noted; no decline in vocal quality during few verbalizations, no cough, and no decline in respiratory status during/post trials. Oral phase was c/b Min+ Confusion during oral prep stage but then grossly adequate bolus management and oral clearing of the boluses given.   D/t pt's declined Cognition and her Edentulous status, recommend continue a Dysphagia level 1(Puree) w/ Thin liquids; aspiration precautions; reduce Distractions during meals and only give po's when pt is fully awake. Pills Crushed in Puree for safer swallowing. Support w/ feeding at meals -- check for oral clearing during/post intake as needed. ST services recommends follow w/ Palliative Care for [name] in general d/t age and declined Cognition which can impact overall oral intake  for nutrition and hydration. Recommend Dietiican f/u as indicated. Precautions posted in room. This diet consistency is most beneficial for pt during admit and at Discharge for safer swallowing and meeting nutritional needs.     HPI HPI: Pt is a 87 y.o. female with a known history of allergies, arthritis, HTN, HLD,  Psychosis currently lives at home with daughter and is on [name] presents to the emergency department for evaluation of AMS -- AMS likely 2/2 UTI, AKI.  Patient was in a usual state of health until today when she was noted to be more somnolent than usual.  Pt is a current hospice patient with a terminal diagnosis of Hypertensive Stage V Chronic Kidney Disease. She was at [facility] for a respite stay while her daughter, [name] had surgery. At baseline, she alternates between A&O x 2 and lethargy. She is bedbound with a Stage 2 pressure ulcer on her sacrum. Her diet is a soft diet with pills crushed. Family and the Hospice Home had noticed a decline in her mental status in general.  Head CT: No acute infarct. No mass or hemorrhage. Atrophy.  CXR: no acute disease.      SLP Plan  All goals met       Recommendations  Diet recommendations: Dysphagia 1 (puree);Thin liquid Liquids provided via: Straw;Cup Medication Administration: Crushed with puree (for safer swallowing) Supervision: Staff to assist with self feeding;Full supervision/cueing for compensatory strategies Compensations: Minimize environmental distractions;Slow rate;Small sips/bites;Lingual sweep for clearance of pocketing;Follow solids with liquid Postural Changes and/or Swallow Maneuvers: Seated upright 90 degrees;Upright 30-60 min after meal;Out of bed for meals                General recommendations:  (Dietician f/u; Palliative Care f/u for [place])  Oral Care Recommendations: Oral care BID;Oral care before and after PO;Staff/trained caregiver to provide oral care Follow up Recommendations: None SLP Visit  Diagnosis: Dysphagia, oral phase (R13.11) (Cognitive decline) Plan: All goals met       GO                 Orinda Kenner, MS, CCC-SLP Speech Language Pathologist Rehab Services [phone] [facility] 10/08/2020, 4:37 PM

## 2020-10-08 NOTE — Discharge Summary (Addendum)
Physician Discharge Summary  Patient ID: Sydney Lowery MRN: [id number] DOB/AGE: 02/01/1924 87 y.o.  Admit date: 10/06/2020 Discharge date: 10/08/2020  Admission Diagnoses:  Discharge Diagnoses:  Active Problems:   Altered mental status   Pressure injury of skin   Discharged Condition: fair  Hospital Course:  87 year old female with history of allergies, arthritis, hypertension, hyperlipidemia who lives at home with daughter and is on home hospice was brought to the ED for altered mental status of 1 day duration.  She was found to have abnormal UA and started on antibiotics. Urine culture came back with gram-negative rods, blood culture negative.  I discussed with pharmacy, patient be given 1 more dose of Rocephin to finish a 3-day course.  She will be also giving 1 dose of fosfomycin before discharge in case she has a resistant bacteria. Today, patient mental status back to baseline, she is medically stable to be discharged  #1.  Acute metabolic encephalopathy with altered mental status secondary to UTI. Gram-negative rods UTI. Mild acute renal insufficiency.  Not consistent with AKI. Patient condition has improved.  Treatment as above  2.  History of a lower extremity DVT PE Not currently on Eliquis.  #3.  Dysphagia. Patient has been evaluated by speech therapy, placed on dysphagia 1 diet.  Will continue.  Patient has a weak cough, but no evidence of aspiration pneumonia.  #4.  Hypokalemia Give a dose of potassium supplement  5.  Anemia secondary to iron deficiency and B12 deficiency. We will give oral supplements per  6. POA pressure ulcer Pressure Injury 10/06/20 Buttocks Left Stage 2 -  Partial thickness loss of dermis presenting as a shallow open injury with a red, pink wound bed without slough. Stage 2 on left buttocks (Active)  10/06/20 2335  Location: Buttocks  Location Orientation: Left  Staging: Stage 2 -  Partial thickness loss of dermis presenting as a shallow open  injury with a red, pink wound bed without slough.  Wound Description (Comments): Stage 2 on left buttocks  Present on Admission: Yes     Pressure Injury 10/06/20 Right Stage 2 -  Partial thickness loss of dermis presenting as a shallow open injury with a red, pink wound bed without slough. Stage 2 on right buttocks (Active)  10/06/20 2341  Location:   Location Orientation: Right  Staging: Stage 2 -  Partial thickness loss of dermis presenting as a shallow open injury with a red, pink wound bed without slough.  Wound Description (Comments): Stage 2 on right buttocks  Present on Admission: Yes         Consults: None  Significant Diagnostic Studies:   Treatments: Rocephin  Discharge Exam: Blood pressure 129/73, pulse 88, temperature 98.4 F (36.9 C), temperature source Oral, resp. rate 18, height 5\' 6"  (1.676 m), weight 70 kg, SpO2 100 %. General appearance: alert and cooperative Resp: Decreased breathing sounds bilaterally Cardio: regular rate and rhythm, S1, S2 normal, no murmur, click, rub or gallop GI: soft, non-tender; bowel sounds normal; no masses,  no organomegaly Extremities: extremities normal, atraumatic, no cyanosis or edema  Disposition: Discharge disposition: 50-Hospice/Home       Discharge Instructions    Diet - low sodium heart healthy   Complete by: As directed    Dysphagia 1 diet   Discharge wound care:   Complete by: As directed    Follow with hospice nurse.   Increase activity slowly   Complete by: As directed      Allergies as of 10/08/2020  Reactions   Aspirin Other (See Comments)   Has diverticulitis, caused bleeding.   Advil [ibuprofen] Swelling   "tongue swelling"   Atorvastatin    Indomethacin    REACTION: gi upset   Morphine And Related Swelling   Swelling in mouth   Penicillins    Sulfa Antibiotics Itching      Medication List    TAKE these medications   Combivent Respimat 20-100 MCG/ACT Aers respimat Generic drug:  Ipratropium-Albuterol Inhale 1 puff into the lungs every 6 (six) hours.   cyanocobalamin 1000 MCG tablet Take 1 tablet (1,000 mcg total) by mouth daily. Start taking on: October 09, 2020   ECHINACEA C COMPLETE PO Take 1 tablet by mouth daily.   iron polysaccharides 150 MG capsule Commonly known as: NIFEREX Take 1 capsule (150 mg total) by mouth daily. Start taking on: October 09, 2020   LORazepam 0.5 MG tablet Commonly known as: ATIVAN Take by mouth daily. Daughter states that pt was started on lorazepam (strength unknown) by hospice once daily.   oxyCODONE 5 MG immediate release tablet Commonly known as: Oxy IR/ROXICODONE Take 5 mg by mouth daily. Pt's daughter states that she usually gives her 1 tablet daily but will give her an additional tablet during the day if her mother states that she's in pain.   QUEtiapine 25 MG tablet Commonly known as: SEROquel Take 1 tablet (25 mg total) by mouth 2 (two) times daily. What changed: additional instructions   simvastatin 20 MG tablet Commonly known as: ZOCOR TAKE 1 TABLET BY MOUTH  DAILY What changed:   how much to take  how to take this  when to take this  additional instructions   triamterene-hydrochlorothiazide 37.5-25 MG tablet Commonly known as: MAXZIDE-25 TAKE ONE-HALF TABLET BY  MOUTH DAILY   Turmeric 500 MG Tabs Take 500 mg by mouth daily.   VITAMIN D-3 PO Take 1,000 Int'l Units by mouth.            Discharge Care Instructions  (From admission, onward)         Start     Ordered   10/08/20 0000  Discharge wound care:       Comments: Follow with hospice nurse.   10/08/20 1020          Follow-up Information    Corwin Levins, MD Follow up in 1 week(s).   Specialties: Internal Medicine, Radiology Contact information: [address] [place] [address] [phone]              32 minutes Signed: Marrion Coy 10/08/2020, 10:20 AM

## 2020-10-08 NOTE — TOC Initial Note (Signed)
Transition of Care [facility]) - Initial/Assessment Note    Patient Details  Name: Sydney Lowery MRN: [id number] Date of Birth: 08-Jul-1923  Transition of Care [facility]) CM/SW Contact:    Allayne Butcher, RN Phone Number: 10/08/2020, 11:00 AM  Clinical Narrative:                 Patient brought in to the hospital from the [facility] facility for UTI.  Patient is at the hospice home for respite care while her daughter is recovering from surgery.  Patient is medically cleared for discharge back to residential hospice today.  Hospice home requests transport be set up for 5pm.  [facility] will pick patient up at 5.    Expected Discharge Plan: Hospice Medical Facility Barriers to Discharge: Barriers Resolved   Patient Goals and CMS Choice Patient states their goals for this hospitalization and ongoing recovery are:: Patient will return to Hospice home for respite care while her daughter is in the hospital      Expected Discharge Plan and Services Expected Discharge Plan: Hospice Medical Facility   Discharge Planning Services: CM Consult   Living arrangements for the past 2 months: Single Family Home Expected Discharge Date: 10/08/20               DME Arranged: N/A         HH Arranged: NA HH Agency: NA        Prior Living Arrangements/Services Living arrangements for the past 2 months: Single Family Home Lives with:: Adult Children Patient language and need for interpreter reviewed:: Yes Do you feel safe going back to the place where you live?: Yes      Need for Family Participation in Patient Care: Yes (Comment) Care giver support system in place?: Yes (comment) (daughter)   Criminal Activity/Legal Involvement Pertinent to Current Situation/Hospitalization: No - Comment as needed  Activities of Daily Living Home Assistive Devices/Equipment: None ADL Screening (condition at time of admission) Patient's cognitive ability adequate to safely complete daily  activities?: No Is the patient deaf or have difficulty hearing?: No Does the patient have difficulty seeing, even when wearing glasses/contacts?: No Does the patient have difficulty concentrating, remembering, or making decisions?: Yes Patient able to express need for assistance with ADLs?: No Does the patient have difficulty dressing or bathing?: Yes Independently performs ADLs?: No Does the patient have difficulty walking or climbing stairs?: Yes Weakness of Legs: Both Weakness of Arms/Hands: Both  Permission Sought/Granted Permission sought to share information with : Case Manager,Family Electrical engineer Permission granted to share information with : Yes, Verbal Permission Granted  Share Information with NAME: [name]  Permission granted to share info w AGENCY: [facility]  Permission granted to share info w Relationship: daughter     Emotional Assessment       Orientation: : Oriented to Self,Oriented to Place Alcohol / Substance Use: Not Applicable Psych Involvement: No (comment)  Admission diagnosis:  Altered mental status [R41.82] Patient Active Problem List   Diagnosis Date Noted  . Pressure injury of skin 10/08/2020  . Altered mental status 10/06/2020  . DVT of lower limb, acute (HCC) 07/26/2018  . Psychosis (HCC) 01/21/2017  . Right knee pain 09/15/2016  . Left ear hearing loss 09/15/2016  . Abdominal pain 08/20/2015  . Encounter for well adult exam with abnormal findings 04/23/2015  . Bilateral foot-drop 04/23/2015  . Chronic low back pain 04/23/2015  . GOUT 09/23/2009  . Acute gouty arthropathy 06/25/2009  .  ANEMIA-NOS 06/25/2009  . Pain in joint, ankle and foot 06/25/2009  . Allergic rhinitis 03/27/2008  . DIVERTICULOSIS, COLON 03/27/2008  . OSTEOPENIA 03/27/2008  . FREQUENCY, URINARY 03/26/2008  . Hyperlipidemia 02/22/2007  . Essential hypertension 02/22/2007   PCP:  Corwin Levins, MD Pharmacy:   [facility] [address] [place]), [place] - [address] [place] [address] Phone: [phone] Fax: [phone]  [facility] - [place], [place] - [address] [place], [address] [place], [address] [place] [address] Phone: [phone] Fax: [phone]  [facility] - [place], [place] - [address] [place] [address] Phone: [phone] Fax: [phone]  [facility] [address] [place] [address] Phone: [phone] Fax: [phone]     Social Determinants of Health (SDOH) Interventions    Readmission Risk Interventions No flowsheet data found.

## 2020-10-09 LAB — URINE CULTURE: Culture: >=100000 — AB

## 2020-10-11 LAB — CULTURE, BLOOD (SINGLE): Culture: NO GROWTH

## 2020-10-14 ENCOUNTER — Inpatient Hospital Stay (HOSPITAL_COMMUNITY)
Admission: EM | Admit: 2020-10-14 | Discharge: 2020-10-16 | DRG: 871 | Disposition: A | Discharge status: Hospice | Attending: Student | Admitting: Student

## 2020-10-14 ENCOUNTER — Encounter (HOSPITAL_COMMUNITY): Payer: Self-pay | Admitting: Emergency Medicine

## 2020-10-14 ENCOUNTER — Emergency Department (HOSPITAL_COMMUNITY)

## 2020-10-14 ENCOUNTER — Other Ambulatory Visit: Payer: Self-pay

## 2020-10-14 DIAGNOSIS — J189 Pneumonia, unspecified organism: Secondary | ICD-10-CM

## 2020-10-14 DIAGNOSIS — Z20822 Contact with and (suspected) exposure to covid-19: Secondary | ICD-10-CM | POA: Diagnosis present

## 2020-10-14 DIAGNOSIS — D519 Vitamin B12 deficiency anemia, unspecified: Secondary | ICD-10-CM | POA: Diagnosis present

## 2020-10-14 DIAGNOSIS — R1310 Dysphagia, unspecified: Secondary | ICD-10-CM

## 2020-10-14 DIAGNOSIS — M21372 Foot drop, left foot: Secondary | ICD-10-CM | POA: Diagnosis present

## 2020-10-14 DIAGNOSIS — Z885 Allergy status to narcotic agent status: Secondary | ICD-10-CM

## 2020-10-14 DIAGNOSIS — L89312 Pressure ulcer of right buttock, stage 2: Secondary | ICD-10-CM | POA: Diagnosis present

## 2020-10-14 DIAGNOSIS — R1312 Dysphagia, oropharyngeal phase: Secondary | ICD-10-CM | POA: Diagnosis not present

## 2020-10-14 DIAGNOSIS — I959 Hypotension, unspecified: Secondary | ICD-10-CM | POA: Diagnosis present

## 2020-10-14 DIAGNOSIS — M21371 Foot drop, right foot: Secondary | ICD-10-CM | POA: Diagnosis present

## 2020-10-14 DIAGNOSIS — M109 Gout, unspecified: Secondary | ICD-10-CM | POA: Diagnosis present

## 2020-10-14 DIAGNOSIS — G9341 Metabolic encephalopathy: Secondary | ICD-10-CM | POA: Diagnosis present

## 2020-10-14 DIAGNOSIS — Z7189 Other specified counseling: Secondary | ICD-10-CM | POA: Diagnosis not present

## 2020-10-14 DIAGNOSIS — J309 Allergic rhinitis, unspecified: Secondary | ICD-10-CM | POA: Diagnosis present

## 2020-10-14 DIAGNOSIS — D72825 Bandemia: Secondary | ICD-10-CM | POA: Diagnosis not present

## 2020-10-14 DIAGNOSIS — E785 Hyperlipidemia, unspecified: Secondary | ICD-10-CM | POA: Diagnosis present

## 2020-10-14 DIAGNOSIS — Z66 Do not resuscitate: Secondary | ICD-10-CM | POA: Diagnosis not present

## 2020-10-14 DIAGNOSIS — D649 Anemia, unspecified: Secondary | ICD-10-CM | POA: Diagnosis not present

## 2020-10-14 DIAGNOSIS — Z86718 Personal history of other venous thrombosis and embolism: Secondary | ICD-10-CM

## 2020-10-14 DIAGNOSIS — Z79899 Other long term (current) drug therapy: Secondary | ICD-10-CM

## 2020-10-14 DIAGNOSIS — Z880 Allergy status to penicillin: Secondary | ICD-10-CM

## 2020-10-14 DIAGNOSIS — Z881 Allergy status to other antibiotic agents status: Secondary | ICD-10-CM

## 2020-10-14 DIAGNOSIS — D509 Iron deficiency anemia, unspecified: Secondary | ICD-10-CM | POA: Diagnosis present

## 2020-10-14 DIAGNOSIS — R5381 Other malaise: Secondary | ICD-10-CM | POA: Diagnosis not present

## 2020-10-14 DIAGNOSIS — F0390 Unspecified dementia without behavioral disturbance: Secondary | ICD-10-CM | POA: Diagnosis not present

## 2020-10-14 DIAGNOSIS — R6520 Severe sepsis without septic shock: Secondary | ICD-10-CM | POA: Diagnosis present

## 2020-10-14 DIAGNOSIS — G8929 Other chronic pain: Secondary | ICD-10-CM | POA: Diagnosis present

## 2020-10-14 DIAGNOSIS — L89322 Pressure ulcer of left buttock, stage 2: Secondary | ICD-10-CM | POA: Diagnosis not present

## 2020-10-14 DIAGNOSIS — I119 Hypertensive heart disease without heart failure: Secondary | ICD-10-CM | POA: Diagnosis present

## 2020-10-14 DIAGNOSIS — J690 Pneumonitis due to inhalation of food and vomit: Secondary | ICD-10-CM | POA: Diagnosis not present

## 2020-10-14 DIAGNOSIS — Z886 Allergy status to analgesic agent status: Secondary | ICD-10-CM

## 2020-10-14 DIAGNOSIS — E872 Acidosis: Secondary | ICD-10-CM | POA: Diagnosis not present

## 2020-10-14 DIAGNOSIS — F17210 Nicotine dependence, cigarettes, uncomplicated: Secondary | ICD-10-CM | POA: Diagnosis present

## 2020-10-14 DIAGNOSIS — H918X2 Other specified hearing loss, left ear: Secondary | ICD-10-CM | POA: Diagnosis present

## 2020-10-14 DIAGNOSIS — Z882 Allergy status to sulfonamides status: Secondary | ICD-10-CM

## 2020-10-14 DIAGNOSIS — A419 Sepsis, unspecified organism: Principal | ICD-10-CM | POA: Diagnosis present

## 2020-10-14 DIAGNOSIS — F29 Unspecified psychosis not due to a substance or known physiological condition: Secondary | ICD-10-CM | POA: Diagnosis present

## 2020-10-14 DIAGNOSIS — J9601 Acute respiratory failure with hypoxia: Secondary | ICD-10-CM | POA: Diagnosis not present

## 2020-10-14 DIAGNOSIS — Z515 Encounter for palliative care: Secondary | ICD-10-CM | POA: Diagnosis not present

## 2020-10-14 DIAGNOSIS — L89302 Pressure ulcer of unspecified buttock, stage 2: Secondary | ICD-10-CM

## 2020-10-14 DIAGNOSIS — Z79891 Long term (current) use of opiate analgesic: Secondary | ICD-10-CM

## 2020-10-14 LAB — CBC WITH DIFFERENTIAL/PLATELET
Abs Immature Granulocytes: 0.13 10*3/uL — ABNORMAL HIGH (ref 0.00–0.07)
Basophils Absolute: 0.1 10*3/uL (ref 0.0–0.1)
Basophils Relative: 0 %
Eosinophils Absolute: 0 10*3/uL (ref 0.0–0.5)
Eosinophils Relative: 0 %
HCT: 35.5 % — ABNORMAL LOW (ref 36.0–46.0)
Hemoglobin: 10.3 g/dL — ABNORMAL LOW (ref 12.0–15.0)
Immature Granulocytes: 1 %
Lymphocytes Relative: 8 %
Lymphs Abs: 1.3 10*3/uL (ref 0.7–4.0)
MCH: 28.1 pg (ref 26.0–34.0)
MCHC: 29 g/dL — ABNORMAL LOW (ref 30.0–36.0)
MCV: 97 fL (ref 80.0–100.0)
Monocytes Absolute: 0.8 10*3/uL (ref 0.1–1.0)
Monocytes Relative: 5 %
Neutro Abs: 14 10*3/uL — ABNORMAL HIGH (ref 1.7–7.7)
Neutrophils Relative %: 86 %
Platelets: 354 10*3/uL (ref 150–400)
RBC: 3.66 MIL/uL — ABNORMAL LOW (ref 3.87–5.11)
RDW: 17.2 % — ABNORMAL HIGH (ref 11.5–15.5)
WBC: 16.3 10*3/uL — ABNORMAL HIGH (ref 4.0–10.5)
nRBC: 0 % (ref 0.0–0.2)

## 2020-10-14 LAB — RESP PANEL BY RT-PCR (FLU A&B, COVID) ARPGX2
Influenza A by PCR: NEGATIVE
Influenza B by PCR: NEGATIVE
SARS Coronavirus 2 by RT PCR: NEGATIVE

## 2020-10-14 LAB — URINALYSIS, ROUTINE W REFLEX MICROSCOPIC
Bacteria, UA: NONE SEEN
Bilirubin Urine: NEGATIVE
Glucose, UA: NEGATIVE mg/dL
Hgb urine dipstick: NEGATIVE
Ketones, ur: NEGATIVE mg/dL
Nitrite: NEGATIVE
Protein, ur: 30 mg/dL — AB
Specific Gravity, Urine: 1.018 (ref 1.005–1.030)
pH: 5 (ref 5.0–8.0)

## 2020-10-14 LAB — LACTIC ACID, PLASMA
Lactic Acid, Venous: 2.2 mmol/L (ref 0.5–1.9)
Lactic Acid, Venous: 2 mmol/L (ref 0.5–1.9)

## 2020-10-14 LAB — COMPREHENSIVE METABOLIC PANEL
ALT: 14 U/L (ref 0–44)
AST: 21 U/L (ref 15–41)
Albumin: 2.7 g/dL — ABNORMAL LOW (ref 3.5–5.0)
Alkaline Phosphatase: 68 U/L (ref 38–126)
Anion gap: 11 (ref 5–15)
BUN: 14 mg/dL (ref 8–23)
CO2: 31 mmol/L (ref 22–32)
Calcium: 8.9 mg/dL (ref 8.9–10.3)
Chloride: 95 mmol/L — ABNORMAL LOW (ref 98–111)
Creatinine, Ser: 0.85 mg/dL (ref 0.44–1.00)
GFR, Estimated: >60 mL/min (ref >60)
Glucose, Bld: 106 mg/dL — ABNORMAL HIGH (ref 70–99)
Potassium: 3.9 mmol/L (ref 3.5–5.1)
Sodium: 137 mmol/L (ref 135–145)
Total Bilirubin: 1 mg/dL (ref 0.3–1.2)
Total Protein: 7.5 g/dL (ref 6.5–8.1)

## 2020-10-14 LAB — APTT: aPTT: 32 seconds (ref 24–36)

## 2020-10-14 LAB — PROTIME-INR
INR: 1.2 (ref 0.8–1.2)
Prothrombin Time: 15.3 seconds — ABNORMAL HIGH (ref 11.4–15.2)

## 2020-10-14 MED ORDER — ENOXAPARIN SODIUM 40 MG/0.4ML ~~LOC~~ SOLN
40.0000 mg | SUBCUTANEOUS | Status: DC
  Administered 2020-10-15 (×2): 40 mg via SUBCUTANEOUS
  Filled 2020-10-14 (×2): qty 0.4

## 2020-10-14 MED ORDER — SODIUM CHLORIDE 0.9 % IV SOLN
500.0000 mg | INTRAVENOUS | Status: DC
  Administered 2020-10-14 – 2020-10-15 (×2): 500 mg via INTRAVENOUS
  Filled 2020-10-14 (×3): qty 500

## 2020-10-14 MED ORDER — SODIUM CHLORIDE 0.9 % IV SOLN
2.0000 g | INTRAVENOUS | Status: DC
  Administered 2020-10-14 – 2020-10-15 (×2): 2 g via INTRAVENOUS
  Filled 2020-10-14: qty 20
  Filled 2020-10-14: qty 2
  Filled 2020-10-14: qty 20

## 2020-10-14 MED ORDER — VITAMIN B-12 1000 MCG PO TABS
1000.0000 ug | ORAL_TABLET | Freq: Every day | ORAL | Status: DC
  Administered 2020-10-16: 1000 ug via ORAL
  Filled 2020-10-14 (×2): qty 1

## 2020-10-14 MED ORDER — LACTATED RINGERS IV BOLUS (SEPSIS)
250.0000 mL | Freq: Once | INTRAVENOUS | Status: AC
  Administered 2020-10-14: 250 mL via INTRAVENOUS

## 2020-10-14 MED ORDER — LACTATED RINGERS IV SOLN: INTRAVENOUS | Status: AC

## 2020-10-14 MED ORDER — POLYSACCHARIDE IRON COMPLEX 150 MG PO CAPS
150.0000 mg | ORAL_CAPSULE | Freq: Every day | ORAL | Status: DC
  Administered 2020-10-16: 150 mg via ORAL
  Filled 2020-10-14 (×2): qty 1

## 2020-10-14 MED ORDER — LACTATED RINGERS IV BOLUS (SEPSIS)
1000.0000 mL | Freq: Once | INTRAVENOUS | Status: AC
  Administered 2020-10-14: 1000 mL via INTRAVENOUS

## 2020-10-14 MED ORDER — LACTATED RINGERS IV BOLUS (SEPSIS)
1000.0000 mL | Freq: Once | INTRAVENOUS | Status: AC
Start: 2020-10-14 — End: 2020-10-14
  Administered 2020-10-14: 1000 mL via INTRAVENOUS

## 2020-10-14 NOTE — ED Triage Notes (Signed)
87 yo biba from home. Pt being cared for by hospice nurse upon EMS arrival. Family complaint of new onset fever of 101 and symptoms of UTI x 2 weeks. Per family pt was prescribed abx for UTI unknown as to whether or not course is complete. Pts family also states that pt is experiencing AMS that has been getting progressively worse. Per hospice nurse pt is hypotensive, last bp per ems upon arrival was 94/60 last bp 104/60. No information pertaining to why pt is under hospice care per EMS.  Vitals: bp 104/60 Hr 102 rr 28 spo2 98 % on 2 L pt on home O2 Temp 101.4  cbg 148 Capno: 27

## 2020-10-14 NOTE — H&P (Signed)
History and Physical    Sydney LoaMary E Carew VHQ:[id number] DOB: 1924/03/21 DOA: 10/14/2020  PCP: Sydney Lowery, Sydney W, MD  Patient coming from: Home with hospice  I have personally briefly reviewed [facility]  Chief Complaint: Altered mental status and hypotension  HPI: Sydney Lowery is a 87 y.o. female with medical history significant for dementia, hypertension, history of DVT not on anticoagulation, hyperlipidemia and gout who presents with concerns of altered mental status and hypotension.  Patient is with hospice at home and normally lives with her daughter but was put into hospice house this past week since daughter needed surgery. Her niece is at bedside who visits patient occasionally.  Patient attempts to answer questions but cannot understand her answers which niece states is normal for her. Pt was noted to have fever and hypotension at hospice and sent to ED. Niece also has noted productive cough but no labored respirations.   Of note, Patient was recently hospitalized and discharged last week for acute metabolic encephalopathy secondary to UTI.    In the ED, she was afebrile, tachycardic and tachynpeic and hypotensive down to 80 over 60s.  Blood pressure was improving with IV fluid resuscitation.  She had leukocytosis of 16.3.  Lactate of 2.2.  BMP otherwise remarkable.  Chest x-ray shows lower lobe pneumonia and small left effusion.  Patient was started on IV Rocephin and azithromycin and hospitalist was called for admission.  UA negative. Urine culture from last admission shows parabola's mirabilis which was sensitive to her ceftriaxone treatment.   Review of Systems:  Unable to obtain due to patient's dementia  Past Medical History:  Diagnosis Date  . Allergic rhinitis 03/27/2008   Qualifier: Diagnosis of  By: Jonny RuizJohn MD, Len BlalockJames Lowery   . ANEMIA-NOS 06/25/2009   Qualifier: Diagnosis of  By: Ewing CMA (AAMA), Robin    . Arthritis   . Bilateral foot-drop  04/23/2015  . Chronic low back pain 04/23/2015  . Essential hypertension 02/22/2007   Qualifier: Diagnosis of  By: Briscoe BurnsArchie CMA, Alvy BealLakisha    . GOUT 09/23/2009   Qualifier: Diagnosis of  By: Jonny RuizJohn MD, Len BlalockJames Lowery   . Hyperlipidemia 02/22/2007   Qualifier: Diagnosis of  By: Briscoe BurnsArchie CMA, Alvy BealLakisha    . Hypertension   . OSTEOPENIA 03/27/2008   Qualifier: Diagnosis of  By: Jonny RuizJohn MD, Len BlalockJames Lowery   . Psychosis (HCC) 01/21/2017    Past Surgical History:  Procedure Laterality Date  . arm fracture Left 1967   Fx. in 3 places and had a dropped wrist     reports that she has been smoking cigarettes. She has been smoking about 0.50 packs per day. She uses smokeless tobacco. She reports that she does not drink alcohol and does not use drugs. Social History  Allergies  Allergen Reactions  . Aspirin Other (See Comments)    Has diverticulitis, caused bleeding.  . Advil [Ibuprofen] Swelling    "tongue swelling"  . Atorvastatin   . Indomethacin     REACTION: gi upset  . Morphine And Related Swelling    Swelling in mouth  . Penicillins   . Sulfa Antibiotics Itching    Family History  Problem Relation Age of Onset  . Heart Problems Mother   . Osteoarthritis Mother   . Heart Problems Father   . Osteoarthritis Father   . Heart Problems Brother   . Osteoarthritis Brother   . Cancer Other      Prior to Admission medications  Medication Sig Start Date End Date Taking? Authorizing Provider  Cholecalciferol (VITAMIN D-3 PO) Take 1,000 Int'l Units by mouth.    [provider]  Ipratropium-Albuterol (COMBIVENT RESPIMAT) 20-100 MCG/ACT AERS respimat Inhale 1 puff into the lungs every 6 (six) hours. 10/08/20   Marrion Coy, MD  iron polysaccharides (NIFEREX) 150 MG capsule Take 1 capsule (150 mg total) by mouth daily. 10/09/20   Marrion Coy, MD  LORazepam (ATIVAN) 0.5 MG tablet Take by mouth daily. Daughter states that pt was started on lorazepam (strength unknown) by hospice once daily.    [provider]  oxyCODONE (OXY IR/ROXICODONE) 5 MG immediate release tablet Take 5 mg by mouth daily. Pt's daughter states that she usually gives her 1 tablet daily but will give her an additional tablet during the day if her mother states that she's in pain.    [provider]  QUEtiapine (SEROQUEL) 25 MG tablet Take 1 tablet (25 mg total) by mouth 2 (two) times daily. Patient taking differently: Take 25 mg by mouth 2 (two) times daily. Daughter gives one tablet in AM or PM if no hallucinations. Gives BID if pt is experiencing hallucinations. 01/31/17   Sydney Levins, MD  simvastatin (ZOCOR) 20 MG tablet TAKE 1 TABLET BY MOUTH  DAILY Patient taking differently: 10 mg. Daughter gives half a 20mg  tablet daily 09/18/18   09/20/18, MD  Specialty Vitamins Products (ECHINACEA C COMPLETE PO) Take 1 tablet by mouth daily.    [provider]  triamterene-hydrochlorothiazide (MAXZIDE-25) 37.5-25 MG tablet TAKE ONE-HALF TABLET BY  MOUTH DAILY 06/19/18   06/21/18, MD  Turmeric 500 MG TABS Take 500 mg by mouth daily.    [provider]  vitamin B-12 1000 MCG tablet Take 1 tablet (1,000 mcg total) by mouth daily. 10/09/20   12/09/20, MD    Physical Exam: Vitals:   10/14/20 1900 10/14/20 1933 10/14/20 2000 10/14/20 2030  BP: 110/64 99/63 100/63 103/63  Pulse: 91 95 98 97  Resp: (!) 26 (!) 25 (!) 22 (!) 30  Temp:      TempSrc:      SpO2: 97% 99% 97% 98%    Constitutional: NAD, calm, comfortable, thin elderly female lying flat in bed Vitals:   10/14/20 1900 10/14/20 1933 10/14/20 2000 10/14/20 2030  BP: 110/64 99/63 100/63 103/63  Pulse: 91 95 98 97  Resp: (!) 26 (!) 25 (!) 22 (!) 30  Temp:      TempSrc:      SpO2: 97% 99% 97% 98%   Eyes: PERRL, lids and conjunctivae normal ENMT: Mucous membranes are moist. Neck: normal, supple Respiratory: clear to auscultation bilaterally, no wheezing, no crackles. Normal respiratory effort on 2L via [place]. No accessory muscle  use.  Cardiovascular: Regular rate and rhythm, no murmurs / rubs / gallops. No extremity edema.  Abdomen: no tenderness, no masses palpated. Bowel sounds positive.  Musculoskeletal: no clubbing / cyanosis. No joint deformity upper and lower extremities.  Skin: no rashes, lesions, ulcers. No induration Neurologic: Alert but disoriented.  Attempts to answer questions but answers are nonsensical..  Intact sensation.  Patient unable to follow simple commands. Psychiatric: Alert but disoriented.   Labs on Admission: I have personally reviewed following labs and imaging studies  CBC: Recent Labs  Lab 10/08/20 0436 10/14/20 1708  WBC 10.4 16.3*  NEUTROABS  --  14.0*  HGB 9.3* 10.3*  HCT 31.3* 35.5*  MCV 95.7 97.0  PLT 307 354  Basic Metabolic Panel: Recent Labs  Lab 10/08/20 0436 10/14/20 1708  NA 136 137  K 3.4* 3.9  CL 97* 95*  CO2 29 31  GLUCOSE 95 106*  BUN 20 14  CREATININE 0.72 0.85  CALCIUM 8.6* 8.9   GFR: Estimated Creatinine Clearance: 37.9 mL/min (by C-G formula based on SCr of 0.85 mg/dL). Liver Function Tests: Recent Labs  Lab 10/14/20 1708  AST 21  ALT 14  ALKPHOS 68  BILITOT 1.0  PROT 7.5  ALBUMIN 2.7*   No results for input(s): LIPASE, AMYLASE in the last 168 hours. No results for input(s): AMMONIA in the last 168 hours. Coagulation Profile: Recent Labs  Lab 10/14/20 1708  INR 1.2   Cardiac Enzymes: No results for input(s): CKTOTAL, CKMB, CKMBINDEX, TROPONINI in the last 168 hours. BNP (last 3 results) No results for input(s): PROBNP in the last 8760 hours. HbA1C: No results for input(s): HGBA1C in the last 72 hours. CBG: Recent Labs  Lab 10/08/20 0818 10/08/20 1124 10/08/20 1543  GLUCAP 93 132* 87   Lipid Profile: No results for input(s): CHOL, HDL, LDLCALC, TRIG, CHOLHDL, LDLDIRECT in the last 72 hours. Thyroid Function Tests: No results for input(s): TSH, T4TOTAL, FREET4, T3FREE, THYROIDAB in the last 72 hours. Anemia Panel: No  results for input(s): VITAMINB12, FOLATE, FERRITIN, TIBC, IRON, RETICCTPCT in the last 72 hours. Urine analysis:    Component Value Date/Time   COLORURINE AMBER (A) 10/14/2020 1809   APPEARANCEUR CLEAR 10/14/2020 1809   LABSPEC 1.018 10/14/2020 1809   PHURINE 5.0 10/14/2020 1809   GLUCOSEU NEGATIVE 10/14/2020 1809   GLUCOSEU NEGATIVE 03/04/2017 1320   HGBUR NEGATIVE 10/14/2020 1809   BILIRUBINUR NEGATIVE 10/14/2020 1809   KETONESUR NEGATIVE 10/14/2020 1809   PROTEINUR 30 (A) 10/14/2020 1809   UROBILINOGEN 0.2 03/04/2017 1320   NITRITE NEGATIVE 10/14/2020 1809   LEUKOCYTESUR MODERATE (A) 10/14/2020 1809    Radiological Exams on Admission: DG Chest Port 1 View  Result Date: 10/14/2020 CLINICAL DATA:  Fever EXAM: PORTABLE CHEST 1 VIEW COMPARISON:  10/06/2020, runoff 07/26/2018 FINDINGS: Right lung is grossly clear. New small left pleural effusion and airspace disease at the left lung base. Stable cardiomediastinal silhouette with convex bulging at the right lower mediastinal silhouette corresponding to saccular aneurysm noted on prior CT. IMPRESSION: Development of small left pleural effusion with left lung base atelectasis or pneumonia. 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 Electronically Signed   By: Jasmine Pang M.D.   On: 10/14/2020 18:14      Assessment/Plan  Severe sepsis secondary to community-acquired pneumonia Patient with tachycardia, tachypnea and hypotensive down to systolic of 80s Continue IV Rocephin and azithromycin Continuous IV fluid resuscitation Hold all antihypertensives  Acute hypoxic respiratory failure secondary to community-acquired pneumonia Admitted on 2 L Continue IV Rocephin and azithromycin Obtain urine Legionella and strep pneumo Patient was placed on dysphasic diet in the last admission but no empyema on imaging to warrant anaerobic  coverage  Dysphagia Patient was evaluated by speech therapy at her last admission and placed on dysphagia 1 diet Pt needs help with feeding- have placed nursing communication to receive assistance during meal time  Stage II decubitus ulcer on buttocks Wound care per RN  Anemia secondary to iron deficiency and B12 deficiency Continue supplementation  DVT prophylaxis:.Lovenox Code Status: Full- confirmed with daughter Family Communication: Plan discussed with niece at bedside and daughter Butch Penny) over the phone disposition  Plan: Home with at least 2 midnight stays  Consults called:  Admission status: inpatient  Level of care: Progressive  Status is: Inpatient  Remains inpatient appropriate because:Inpatient level of care appropriate due to severity of illness   Dispo: The patient is from: Home              Anticipated d/c is to: Home              Patient currently is not medically stable to d/c.   Difficult to place patient No         Anselm Jungling DO [facility]   If 7PM-7AM, please contact night-coverage [email or web address]   10/14/2020, 9:02 PM

## 2020-10-14 NOTE — ED Notes (Signed)
PureWick placed on patient. 

## 2020-10-14 NOTE — Progress Notes (Signed)
[employer] [facility]) Hospital Liaison: RN note    This patient is a current hospice patient with [employer]. Hospital liaison will follow while ED and if admitted to in patient status.   Please call with any questions.  Thank you,   Elsie Saas, RN, [facility]      [facility] Liaison (listed on [facility] under Hospice [facility])    [phone]

## 2020-10-14 NOTE — Progress Notes (Signed)
Notified bedside nurse of need to draw repeat lactic acid @ 1843.

## 2020-10-14 NOTE — ED Provider Notes (Signed)
[facility]-EMERGENCY DEPT Provider Note   CSN: [id number] Arrival date & time: 10/14/20  1603     History Chief Complaint  Patient presents with  . Fever    Sydney Lowery is a 87 y.o. female.  87 yo F with a cc of fever and hypotension.  Noted by the nursing home staff.  Patient is demented at baseline.  Level 5 caveat.   Fever      Past Medical History:  Diagnosis Date  . Allergic rhinitis 03/27/2008   Qualifier: Diagnosis of  By: Jonny Ruiz MD, Len Blalock   . ANEMIA-NOS 06/25/2009   Qualifier: Diagnosis of  By: Ewing CMA (AAMA), Robin    . Arthritis   . Bilateral foot-drop 04/23/2015  . Chronic low back pain 04/23/2015  . Essential hypertension 02/22/2007   Qualifier: Diagnosis of  By: Briscoe Burns CMA, Alvy Beal    . GOUT 09/23/2009   Qualifier: Diagnosis of  By: Jonny Ruiz MD, Len Blalock   . Hyperlipidemia 02/22/2007   Qualifier: Diagnosis of  By: Briscoe Burns CMA, Alvy Beal    . Hypertension   . OSTEOPENIA 03/27/2008   Qualifier: Diagnosis of  By: Jonny Ruiz MD, Len Blalock   . Psychosis (HCC) 01/21/2017    Patient Active Problem List   Diagnosis Date Noted  . Sepsis (HCC) 10/14/2020  . Community acquired pneumonia 10/14/2020  . Acute respiratory failure with hypoxia (HCC) 10/14/2020  . Dysphagia 10/14/2020  . Decubitus ulcer of buttock, stage 2 (HCC) 10/14/2020  . Pressure injury of skin 10/08/2020  . Altered mental status 10/06/2020  . DVT of lower limb, acute (HCC) 07/26/2018  . Psychosis (HCC) 01/21/2017  . Right knee pain 09/15/2016  . Left ear hearing loss 09/15/2016  . Abdominal pain 08/20/2015  . Encounter for well adult exam with abnormal findings 04/23/2015  . Bilateral foot-drop 04/23/2015  . Chronic low back pain 04/23/2015  . GOUT 09/23/2009  . Acute gouty arthropathy 06/25/2009  . ANEMIA-NOS 06/25/2009  . Pain in joint, ankle and foot 06/25/2009  . Allergic rhinitis 03/27/2008  . DIVERTICULOSIS, COLON 03/27/2008  . OSTEOPENIA 03/27/2008  . FREQUENCY, URINARY  03/26/2008  . Hyperlipidemia 02/22/2007  . Essential hypertension 02/22/2007    Past Surgical History:  Procedure Laterality Date  . arm fracture Left 1967   Fx. in 3 places and had a dropped wrist     OB History   No obstetric history on file.     Family History  Problem Relation Age of Onset  . Heart Problems Mother   . Osteoarthritis Mother   . Heart Problems Father   . Osteoarthritis Father   . Heart Problems Brother   . Osteoarthritis Brother   . Cancer Other     Social History   Tobacco Use  . Smoking status: Current Every Day Smoker    Packs/day: 0.50    Types: Cigarettes  . Smokeless tobacco: Current User  Substance Use Topics  . Alcohol use: No  . Drug use: No    Home Medications Prior to Admission medications   Medication Sig Start Date End Date Taking? Authorizing Provider  Cholecalciferol (VITAMIN D-3 PO) Take 1,000 Int'l Units by mouth.   Yes [provider]  Ipratropium-Albuterol (COMBIVENT RESPIMAT) 20-100 MCG/ACT AERS respimat Inhale 1 puff into the lungs every 6 (six) hours. 10/08/20  Yes Marrion Coy, MD  iron polysaccharides (NIFEREX) 150 MG capsule Take 1 capsule (150 mg total) by mouth daily. 10/09/20  Yes Marrion Coy, MD  LORazepam (ATIVAN) 0.5 MG  tablet Take 0.5 mg by mouth daily. Daughter states that pt was started on lorazepam (strength unknown) by hospice once daily.   Yes [provider]  oxyCODONE (OXY IR/ROXICODONE) 5 MG immediate release tablet Take 5 mg by mouth daily. Pt's daughter states that she usually gives her 1 tablet daily but will give her an additional tablet during the day if her mother states that she's in pain.   Yes [provider]  QUEtiapine (SEROQUEL) 25 MG tablet Take 1 tablet (25 mg total) by mouth 2 (two) times daily. Patient taking differently: Take 25 mg by mouth 2 (two) times daily. Daughter gives one tablet in AM or PM if no hallucinations. Gives BID if pt is experiencing hallucinations.  01/31/17  Yes Corwin Levins, MD  simvastatin (ZOCOR) 20 MG tablet TAKE 1 TABLET BY MOUTH  DAILY Patient taking differently: 10 mg. Daughter gives half a 20mg  tablet daily 09/18/18  Yes 09/20/18, MD  Specialty Vitamins Products (ECHINACEA C COMPLETE PO) Take 1 tablet by mouth daily.   Yes [provider]  traZODone (DESYREL) 100 MG tablet Take 100 mg by mouth at bedtime. 09/19/20  Yes [provider]  triamterene-hydrochlorothiazide (MAXZIDE-25) 37.5-25 MG tablet TAKE ONE-HALF TABLET BY  MOUTH DAILY 06/19/18  Yes 06/21/18, MD  Turmeric 500 MG TABS Take 500 mg by mouth daily.   Yes [provider]  vitamin B-12 1000 MCG tablet Take 1 tablet (1,000 mcg total) by mouth daily. 10/09/20  Yes 12/09/20, MD    Allergies    Aspirin, Advil [ibuprofen], Atorvastatin, Indomethacin, Morphine and related, Penicillins, and Sulfa antibiotics  Review of Systems   Review of Systems  Unable to perform ROS: Dementia  Constitutional: Positive for fever.    Physical Exam Updated Vital Signs BP 103/63   Pulse 97   Temp 98.9 F (37.2 C) (Rectal)   Resp (!) 30   SpO2 98%   Physical Exam Vitals and nursing note reviewed.  Constitutional:      General: She is not in acute distress.    Appearance: She is well-developed. She is not diaphoretic.     Comments: Skin is hot to touch  HENT:     Head: Normocephalic and atraumatic.  Eyes:     Pupils: Pupils are equal, round, and reactive to light.  Cardiovascular:     Rate and Rhythm: Normal rate and regular rhythm.     Heart sounds: No murmur heard. No friction rub. No gallop.   Pulmonary:     Effort: Pulmonary effort is normal.     Breath sounds: Wheezing present. No rales.     Comments: Tachypnea Abdominal:     General: There is no distension.     Palpations: Abdomen is soft.     Tenderness: There is no abdominal tenderness.  Musculoskeletal:        General: No tenderness.     Cervical back: Normal range of  motion and neck supple.  Skin:    General: Skin is warm and dry.  Neurological:     Mental Status: She is alert and oriented to person, place, and time.  Psychiatric:        Behavior: Behavior normal.     ED Results / Procedures / Treatments   Labs (all labs ordered are listed, but only abnormal results are displayed) Labs Reviewed  LACTIC ACID, PLASMA - Abnormal; Notable for the following components:      Result Value   Lactic Acid,  Venous 2.0 (*)    All other components within normal limits  LACTIC ACID, PLASMA - Abnormal; Notable for the following components:   Lactic Acid, Venous 2.2 (*)    All other components within normal limits  COMPREHENSIVE METABOLIC PANEL - Abnormal; Notable for the following components:   Chloride 95 (*)    Glucose, Bld 106 (*)    Albumin 2.7 (*)    All other components within normal limits  CBC WITH DIFFERENTIAL/PLATELET - Abnormal; Notable for the following components:   WBC 16.3 (*)    RBC 3.66 (*)    Hemoglobin 10.3 (*)    HCT 35.5 (*)    MCHC 29.0 (*)    RDW 17.2 (*)    Neutro Abs 14.0 (*)    Abs Immature Granulocytes 0.13 (*)    All other components within normal limits  PROTIME-INR - Abnormal; Notable for the following components:   Prothrombin Time 15.3 (*)    All other components within normal limits  URINALYSIS, ROUTINE W REFLEX MICROSCOPIC - Abnormal; Notable for the following components:   Color, Urine AMBER (*)    Protein, ur 30 (*)    Leukocytes,Ua MODERATE (*)    All other components within normal limits  RESP PANEL BY RT-PCR (FLU A&B, COVID) ARPGX2  CULTURE, BLOOD (ROUTINE X 2)  CULTURE, BLOOD (ROUTINE X 2)  URINE CULTURE  APTT  LEGIONELLA PNEUMOPHILA SEROGP 1 UR AG  STREP PNEUMONIAE URINARY ANTIGEN  BASIC METABOLIC PANEL  CBC    EKG EKG Interpretation  Date/Time:  Tuesday October 14 2020 16:18:18 EDT Ventricular Rate:  102 PR Interval:  185 QRS Duration: 143 QT Interval:  368 QTC Calculation: 480 R  Axis:   -85 Text Interpretation: Sinus or ectopic atrial tachycardia RBBB and LAFB No old tracing to compare Confirmed by Melene PlanFloyd, Jameca Chumley [phone]([id number]) on 10/14/2020 4:55:49 PM   Radiology DG Chest Port 1 View  Result Date: 10/14/2020 CLINICAL DATA:  Fever EXAM: PORTABLE CHEST 1 VIEW COMPARISON:  10/06/2020, runoff 07/26/2018 FINDINGS: Right lung is grossly clear. New small left pleural effusion and airspace disease at the left lung base. Stable cardiomediastinal silhouette with convex bulging at the right lower mediastinal silhouette corresponding to saccular aneurysm noted on prior CT. IMPRESSION: Development of small left pleural effusion with left lung base atelectasis or pneumonia. 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 Electronically Signed   By: Jasmine PangKim  Fujinaga M.D.   On: 10/14/2020 18:14    Procedures Procedures   Medications Ordered in ED Medications  lactated ringers infusion ( Intravenous New Bag/Given (Non-Interop) 10/14/20 1920)  cefTRIAXone (ROCEPHIN) 2 g in sodium chloride 0.9 % 100 mL IVPB (0 g Intravenous Stopped 10/14/20 2127)  azithromycin (ZITHROMAX) 500 mg in sodium chloride 0.9 % 250 mL IVPB (0 mg Intravenous Stopped 10/14/20 1908)  enoxaparin (LOVENOX) injection 40 mg (has no administration in time range)  vitamin B-12 (CYANOCOBALAMIN) tablet 1,000 mcg (has no administration in time range)  iron polysaccharides (NIFEREX) capsule 150 mg (has no administration in time range)  lactated ringers bolus 1,000 mL (0 mLs Intravenous Stopped 10/14/20 1908)    And  lactated ringers bolus 1,000 mL (0 mLs Intravenous Stopped 10/14/20 2042)    And  lactated ringers bolus 250 mL (0 mLs Intravenous Stopped 10/14/20 2042)    ED Course  I have reviewed the triage vital signs and the nursing notes.  Pertinent labs & imaging results that  were available during my care of the patient were reviewed by me and  considered in my medical decision making (see chart for details).    MDM Rules/Calculators/A&P                          87 yo F on hospice here with a chief complaints of hypotension and fever.  Was just in the hospital at the beginning of the month with a urinary tract infection.  Patient hypotensive on initial blood pressure code sepsis initiated.  Reassess.  Patient's blood pressure is improved with IV fluids.  Family is at bedside though not the patient's primary caregiver.  States that she saw her yesterday and thought that she was doing well.  Has been having some cough and congestion.  Chest x-ray with likely pneumonia.  Will discuss with the hospitalist.  CRITICAL CARE Performed by: Rae Roam   Total critical care time: 35 minutes  Critical care time was exclusive of separately billable procedures and treating other patients.  Critical care was necessary to treat or prevent imminent or life-threatening deterioration.  Critical care was time spent personally by me on the following activities: development of treatment plan with patient and/or surrogate as well as nursing, discussions with consultants, evaluation of patient's response to treatment, examination of patient, obtaining history from patient or surrogate, ordering and performing treatments and interventions, ordering and review of laboratory studies, ordering and review of radiographic studies, pulse oximetry and re-evaluation of patient's condition.  The patients results and plan were reviewed and discussed.   Any x-rays performed were independently reviewed by myself.   Differential diagnosis were considered with the presenting HPI.  Medications  lactated ringers infusion ( Intravenous New Bag/Given (Non-Interop) 10/14/20 1920)  cefTRIAXone (ROCEPHIN) 2 g in sodium chloride 0.9 % 100 mL IVPB (0 g Intravenous Stopped 10/14/20 2127)  azithromycin (ZITHROMAX) 500 mg in sodium chloride 0.9 % 250 mL IVPB (0 mg  Intravenous Stopped 10/14/20 1908)  enoxaparin (LOVENOX) injection 40 mg (has no administration in time range)  vitamin B-12 (CYANOCOBALAMIN) tablet 1,000 mcg (has no administration in time range)  iron polysaccharides (NIFEREX) capsule 150 mg (has no administration in time range)  lactated ringers bolus 1,000 mL (0 mLs Intravenous Stopped 10/14/20 1908)    And  lactated ringers bolus 1,000 mL (0 mLs Intravenous Stopped 10/14/20 2042)    And  lactated ringers bolus 250 mL (0 mLs Intravenous Stopped 10/14/20 2042)    Vitals:   10/14/20 1900 10/14/20 1933 10/14/20 2000 10/14/20 2030  BP: 110/64 99/63 100/63 103/63  Pulse: 91 95 98 97  Resp: (!) 26 (!) 25 (!) 22 (!) 30  Temp:      TempSrc:      SpO2: 97% 99% 97% 98%    Final diagnoses:  Pneumonia of left lower lobe due to infectious organism    Admission/ observation were discussed with the admitting physician, patient and/or family and they are comfortable with the plan.    Final Clinical Impression(s) / ED Diagnoses Final diagnoses:  Pneumonia of left lower lobe due to infectious organism    Rx / DC Orders ED Discharge Orders    None       Melene Plan, DO 10/14/20 2313

## 2020-10-14 NOTE — Progress Notes (Signed)
Following for code sepsis 

## 2020-10-15 DIAGNOSIS — F0390 Unspecified dementia without behavioral disturbance: Secondary | ICD-10-CM

## 2020-10-15 DIAGNOSIS — D72825 Bandemia: Secondary | ICD-10-CM

## 2020-10-15 DIAGNOSIS — Z7189 Other specified counseling: Secondary | ICD-10-CM

## 2020-10-15 DIAGNOSIS — J690 Pneumonitis due to inhalation of food and vomit: Secondary | ICD-10-CM

## 2020-10-15 DIAGNOSIS — A419 Sepsis, unspecified organism: Secondary | ICD-10-CM | POA: Diagnosis present

## 2020-10-15 DIAGNOSIS — E872 Acidosis: Secondary | ICD-10-CM

## 2020-10-15 LAB — BASIC METABOLIC PANEL
Anion gap: 10 (ref 5–15)
BUN: 12 mg/dL (ref 8–23)
CO2: 29 mmol/L (ref 22–32)
Calcium: 8.1 mg/dL — ABNORMAL LOW (ref 8.9–10.3)
Chloride: 99 mmol/L (ref 98–111)
Creatinine, Ser: 0.73 mg/dL (ref 0.44–1.00)
GFR, Estimated: >60 mL/min (ref >60)
Glucose, Bld: 87 mg/dL (ref 70–99)
Potassium: 4 mmol/L (ref 3.5–5.1)
Sodium: 138 mmol/L (ref 135–145)

## 2020-10-15 LAB — CBC
HCT: 27.2 % — ABNORMAL LOW (ref 36.0–46.0)
Hemoglobin: 8.3 g/dL — ABNORMAL LOW (ref 12.0–15.0)
MCH: 29 pg (ref 26.0–34.0)
MCHC: 30.5 g/dL (ref 30.0–36.0)
MCV: 95.1 fL (ref 80.0–100.0)
Platelets: 264 10*3/uL (ref 150–400)
RBC: 2.86 MIL/uL — ABNORMAL LOW (ref 3.87–5.11)
RDW: 17.2 % — ABNORMAL HIGH (ref 11.5–15.5)
WBC: 16.6 10*3/uL — ABNORMAL HIGH (ref 4.0–10.5)
nRBC: 0 % (ref 0.0–0.2)

## 2020-10-15 LAB — STREP PNEUMONIAE URINARY ANTIGEN: Strep Pneumo Urinary Antigen: NEGATIVE

## 2020-10-15 MED ORDER — LACTATED RINGERS IV SOLN: INTRAVENOUS | Status: AC

## 2020-10-15 MED ORDER — TRAZODONE HCL 50 MG PO TABS
50.0000 mg | ORAL_TABLET | Freq: Every day | ORAL | Status: DC
  Administered 2020-10-15: 50 mg via ORAL
  Filled 2020-10-15: qty 1

## 2020-10-15 MED ORDER — IPRATROPIUM-ALBUTEROL 20-100 MCG/ACT IN AERS: 1.0000 | INHALATION_SPRAY | Freq: Four times a day (QID) | RESPIRATORY_TRACT | Status: DC | PRN

## 2020-10-15 MED ORDER — QUETIAPINE FUMARATE 25 MG PO TABS
25.0000 mg | ORAL_TABLET | Freq: Every day | ORAL | Status: DC
  Administered 2020-10-15: 25 mg via ORAL
  Filled 2020-10-15: qty 1

## 2020-10-15 MED ORDER — ACETAMINOPHEN 325 MG PO TABS
650.0000 mg | ORAL_TABLET | Freq: Four times a day (QID) | ORAL | Status: DC | PRN
  Administered 2020-10-15: 650 mg via ORAL
  Filled 2020-10-15: qty 2

## 2020-10-15 MED ORDER — LORAZEPAM 0.5 MG PO TABS
0.5000 mg | ORAL_TABLET | Freq: Two times a day (BID) | ORAL | Status: DC | PRN
  Administered 2020-10-15: 0.5 mg via ORAL
  Filled 2020-10-15: qty 1

## 2020-10-15 NOTE — ED Notes (Signed)
Patient saying "I am so hungry. I need a sandwich." Patient told that the breakfast trays will be after 8 am. Patient given some water.

## 2020-10-15 NOTE — Progress Notes (Addendum)
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  Sydney Lowery is current hospice patient with a terminal diagnosis of COPD.  Patient was congested with cough, UTI symptoms. Hospice nurse assessed patient who was hypotensive. Family elected to have patient sent to ED for evaluation. EMS was activated and patient was brought to [facility] ED. Patient was admitted with sepsis and pneumonia. Per Dr. Dan Humphreys with [facility], this is a related admission.  Visited patient at bedside. She was being evaluated by speech therapy at time of visit. No family present. Patient was awake and alert. She is oriented to person but confused to situation and place.  She denies pain and reports feeling better than the night before.   VS: 98.3, 127/70, 88, 22, 100% RA Abnormal labs: 10/15/2020 03:37 Calcium: 8.1 (L) WBC: 16.6 (H) RBC: 2.86 (L) Hemoglobin: 8.3 (L) HCT: 27.2 (L) RDW: 17.2 (H) Lactic Acid, Venous: 2.0 (HH)  Epic Imaging CXR FINDINGS: Right lung is grossly clear. New small left pleural effusion and airspace disease at the left lung base. Stable cardiomediastinal silhouette with convex bulging at the right lower mediastinal silhouette corresponding to saccular aneurysm noted on prior CT. IMPRESSION: Development of small left pleural effusion with left lung base atelectasis or pneumonia. 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  Discharge planning: Ongoing. Should return home with hospice.   Family Contact: left message  IDG: updated  Goals of Care:  Patient is now a DNR  Elsie Saas, RN, CCM  [facility] Liaison (listed on AMION under Hospice/[facility])    [phone]

## 2020-10-15 NOTE — ED Notes (Signed)
Per Alanda Slim, MD. This RN will perform swallow screen prior to administration of PO meds or initiation of diet

## 2020-10-15 NOTE — Progress Notes (Signed)
PROGRESS NOTE  Sydney Lowery IWL:[id number] DOB: 12-31-1923   PCP: Biagio Borg, MD  Patient is from: Hospice home.  DOA: 10/14/2020 LOS: 1  Chief complaints: Altered mental status, productive cough, fever and low blood pressure  Brief Narrative / Interim history: 87 year old F with PMH of dementia, HTN, DVT not on AC, dysphagia, recent UTI and stage II decubitus ulcer presenting with altered mental status, fever, productive cough and hypotension, and admitted for severe sepsis in the setting of LLL pneumonia.  Cultures drawn.  Resuscitated with IV fluid.  She was a started on IV ceftriaxone and azithromycin.   Subjective: Seen and examined earlier this morning.  No major events overnight or this morning.  Patient denies pain, shortness of breath, nausea, vomiting or abdominal pain but not a reliable historian.  She is awake but only oriented to self and "[place]".  She reports feeling hungry and likes to have hot dog.  Objective: Vitals:   10/15/20 1200 10/15/20 1305 10/15/20 1306 10/15/20 1418  BP: 106/62 117/64  127/70  Pulse: 87 86 86 88  Resp: (!) 26 (!) 27 (!) 28 (!) 22  Temp:    98.3 F (36.8 C)  TempSrc:    Oral  SpO2: 100% 100% 100% 100%    Intake/Output Summary (Last 24 hours) at 10/15/2020 1514 Last data filed at 10/14/2020 2127 Gross per 24 hour  Intake 2602.86 ml  Output --  Net 2602.86 ml   There were no vitals filed for this visit.  Examination:  GENERAL: No apparent distress.  Nontoxic. HEENT: MMM.  Vision and hearing grossly intact.  NECK: Supple.  No apparent JVD.  RESP:  No IWOB.  Fair aeration bilaterally. CVS:  RRR. Heart sounds normal.  ABD/GI/GU: BS+. Abd soft, NTND.  MSK/EXT:  Moves extremities.  BLE weakness.  SKIN: no apparent skin lesion or wound NEURO: Awake and alert.  Oriented to self and [facility].  Motor 3/5 in LLE and 2+/5 in RLE PSYCH: Calm. Normal affect.   Procedures:  None  Microbiology summarized: [id number] and influenza  PCR nonreactive. Blood culture NGTD. Urine culture pending  Assessment & Plan: Severe sepsis due to possible aspiration pneumonia and patient with dysphagia: POA.  Met criteria with leukocytosis, tachycardia, tachypnea, hypotension, encephalopathy and lactic acidosis.  Seems to be improving clinically.  Still with significant leukocytosis. -Continue IV ceftriaxone and azithromycin -SLP eval for dysphagia -Aspiration precautions. -Mucolytic's, incentive spirometry -Trend leukocytosis and lactic acidosis.  Acute respiratory failure with hypoxia: Desaturated to 89% on RA and started on 2 L by nasal cannula.  Likely due to the above.  Seems to have resolved. -Management as above  Dysphagia-SLP recommended dysphagia 1 diet last hospitalization.  However, her daughter has been feeding her regular diet at home -Continue dysphagia 1 diet for now.  Assistance with feeding and hydration. -SLP eval  Iron deficiency and B12 deficiency anemia: Drop in Hgb likely hemodilution Recent Labs    10/06/20 1453 10/07/20 0406 10/08/20 0436 10/14/20 1708 10/15/20 0337  HGB 9.7* 9.7* 9.3* 10.3* 8.3*  -Continue monitoring. -P.o. ferrous sulfate and vitamin B12 with bowel regimen.  Dementia without behavioral disturbance/anxiety: Stable. -Reorientation and delirium precautions -Assistance with feeding and hydration -Resume home medications.  Leukocytosis/bandemia: Likely due to #1. -Continue monitoring  Lactic acidosis: Likely due to #1. -Recheck  Goal of care: Admitted as full code.  I have discussed the pros and cons of resuscitation including CPR and intubation with patient's daughter over the phone.  I recommended DNR/DNI.  Daughter in agreement.  CODE STATUS changed to DNR/DNI.  There is no height or weight on file to calculate BMI.        Stage II decubitus ulcer on buttocks Pressure Injury 10/06/20 Buttocks Left Stage 2 -  Partial thickness loss of dermis presenting as a shallow open  injury with a red, pink wound bed without slough. Stage 2 on left buttocks (Active)  10/06/20 2335  Location: Buttocks  Location Orientation: Left  Staging: Stage 2 -  Partial thickness loss of dermis presenting as a shallow open injury with a red, pink wound bed without slough.  Wound Description (Comments): Stage 2 on left buttocks  Present on Admission: Yes     Pressure Injury 10/06/20 Right Stage 2 -  Partial thickness loss of dermis presenting as a shallow open injury with a red, pink wound bed without slough. Stage 2 on right buttocks (Active)  10/06/20 2341  Location:   Location Orientation: Right  Staging: Stage 2 -  Partial thickness loss of dermis presenting as a shallow open injury with a red, pink wound bed without slough.  Wound Description (Comments): Stage 2 on right buttocks  Present on Admission: Yes   DVT prophylaxis:  enoxaparin (LOVENOX) injection 40 mg Start: 10/14/20 2200  Code Status: DNR/DNI Family Communication: Updated patient's daughter over the phone. Level of care: Telemetry Status is: Inpatient  Remains inpatient appropriate because:Ongoing diagnostic testing needed not appropriate for outpatient work up, IV treatments appropriate due to intensity of illness or inability to take PO and Inpatient level of care appropriate due to severity of illness   Dispo: The patient is from: Home              Anticipated d/c is to: Home              Patient currently is not medically stable to d/c.   Difficult to place patient No       Consultants:  None   Sch Meds:  Scheduled Meds: . enoxaparin (LOVENOX) injection  40 mg Subcutaneous Q24H  . iron polysaccharides  150 mg Oral Daily  . QUEtiapine  25 mg Oral QHS  . traZODone  50 mg Oral QHS  . cyanocobalamin  1,000 mcg Oral Daily   Continuous Infusions: . azithromycin Stopped (10/14/20 1908)  . cefTRIAXone (ROCEPHIN)  IV Stopped (10/14/20 2127)   PRN Meds:.Ipratropium-Albuterol,  LORazepam  Antimicrobials: Anti-infectives (From admission, onward)   Start     Dose/Rate Route Frequency Ordered Stop   10/14/20 1645  cefTRIAXone (ROCEPHIN) 2 g in sodium chloride 0.9 % 100 mL IVPB        2 g 200 mL/hr over 30 Minutes Intravenous Every 24 hours 10/14/20 1643     10/14/20 1645  azithromycin (ZITHROMAX) 500 mg in sodium chloride 0.9 % 250 mL IVPB        500 mg 250 mL/hr over 60 Minutes Intravenous Every 24 hours 10/14/20 1643         I have personally reviewed the following labs and images: CBC: Recent Labs  Lab 10/14/20 1708 10/15/20 0337  WBC 16.3* 16.6*  NEUTROABS 14.0*  --   HGB 10.3* 8.3*  HCT 35.5* 27.2*  MCV 97.0 95.1  PLT 354 264   BMP &GFR Recent Labs  Lab 10/14/20 1708 10/15/20 0337  NA 137 138  K 3.9 4.0  CL 95* 99  CO2 31 29  GLUCOSE 106* 87  BUN 14 12  CREATININE 0.85 0.73  CALCIUM 8.9 8.1*  Estimated Creatinine Clearance: 40.3 mL/min (by C-G formula based on SCr of 0.73 mg/dL). Liver & Pancreas: Recent Labs  Lab 10/14/20 1708  AST 21  ALT 14  ALKPHOS 68  BILITOT 1.0  PROT 7.5  ALBUMIN 2.7*   No results for input(s): LIPASE, AMYLASE in the last 168 hours. No results for input(s): AMMONIA in the last 168 hours. Diabetic: No results for input(s): HGBA1C in the last 72 hours. Recent Labs  Lab 10/08/20 1543  GLUCAP 87   Cardiac Enzymes: No results for input(s): CKTOTAL, CKMB, CKMBINDEX, TROPONINI in the last 168 hours. No results for input(s): PROBNP in the last 8760 hours. Coagulation Profile: Recent Labs  Lab 10/14/20 1708  INR 1.2   Thyroid Function Tests: No results for input(s): TSH, T4TOTAL, FREET4, T3FREE, THYROIDAB in the last 72 hours. Lipid Profile: No results for input(s): CHOL, HDL, LDLCALC, TRIG, CHOLHDL, LDLDIRECT in the last 72 hours. Anemia Panel: No results for input(s): VITAMINB12, FOLATE, FERRITIN, TIBC, IRON, RETICCTPCT in the last 72 hours. Urine analysis:    Component Value Date/Time    COLORURINE AMBER (A) 10/14/2020 1809   APPEARANCEUR CLEAR 10/14/2020 1809   LABSPEC 1.018 10/14/2020 1809   PHURINE 5.0 10/14/2020 1809   GLUCOSEU NEGATIVE 10/14/2020 1809   GLUCOSEU NEGATIVE 03/04/2017 1320   HGBUR NEGATIVE 10/14/2020 1809   BILIRUBINUR NEGATIVE 10/14/2020 1809   KETONESUR NEGATIVE 10/14/2020 1809   PROTEINUR 30 (A) 10/14/2020 1809   UROBILINOGEN 0.2 03/04/2017 1320   NITRITE NEGATIVE 10/14/2020 1809   LEUKOCYTESUR MODERATE (A) 10/14/2020 1809   Sepsis Labs: Invalid input(s): PROCALCITONIN, [place]  Microbiology: Recent Results (from the past 240 hour(s))  Blood culture (routine single)     Status: None   Collection Time: 10/06/20  2:53 PM   Specimen: BLOOD  Result Value Ref Range Status   Specimen Description BLOOD BLOOD RIGHT FOREARM  Final   Special Requests   Final    BOTTLES DRAWN AEROBIC AND ANAEROBIC Blood Culture results may not be optimal due to an inadequate volume of blood received in culture bottles   Culture   Final    NO GROWTH 5 DAYS Performed at [facility], [address] [place], [place] [address]    Report Status 10/11/2020 FINAL  Final  Urine culture     Status: Abnormal   Collection Time: 10/06/20  3:48 PM   Specimen: Urine, Random  Result Value Ref Range Status   Specimen Description   Final    URINE, RANDOM Performed at [facility], [address], [place], [place] [address]    Special Requests   Final    NONE Performed at [facility], [address] [place], [place] [address]    Culture >=100,000 COLONIES/mL PROTEUS MIRABILIS (A)  Final   Report Status 10/09/2020 FINAL  Final   Organism ID, Bacteria PROTEUS MIRABILIS (A)  Final      Susceptibility   Proteus mirabilis - MIC*    AMPICILLIN <=2 SENSITIVE Sensitive     CEFAZOLIN <=4 SENSITIVE Sensitive     CEFEPIME <=0.12 SENSITIVE Sensitive     CEFTRIAXONE <=0.25 SENSITIVE Sensitive     CIPROFLOXACIN <=0.25 SENSITIVE Sensitive      GENTAMICIN <=1 SENSITIVE Sensitive     IMIPENEM 2 SENSITIVE Sensitive     NITROFURANTOIN 128 RESISTANT Resistant     TRIMETH/SULFA <=20 SENSITIVE Sensitive     AMPICILLIN/SULBACTAM <=2 SENSITIVE Sensitive     PIP/TAZO <=4 SENSITIVE Sensitive     * >=100,000 COLONIES/mL  PROTEUS MIRABILIS  Resp Panel by RT-PCR (Flu A&B, Covid) Nasopharyngeal Swab     Status: None   Collection Time: 10/06/20  3:48 PM   Specimen: Nasopharyngeal Swab; Nasopharyngeal(NP) swabs in vial transport medium  Result Value Ref Range Status   SARS Coronavirus 2 by RT PCR NEGATIVE NEGATIVE Final    Comment: (NOTE) SARS-CoV-2 target nucleic acids are NOT DETECTED.  The SARS-CoV-2 RNA is generally detectable in upper respiratory specimens during the acute phase of infection. The lowest concentration of SARS-CoV-2 viral copies this assay can detect is 138 copies/mL. A negative result does not preclude SARS-Cov-2 infection and should not be used as the sole basis for treatment or other patient management decisions. A negative result may occur with  improper specimen collection/handling, submission of specimen other than nasopharyngeal swab, presence of viral mutation(s) within the areas targeted by this assay, and inadequate number of viral copies(<138 copies/mL). A negative result must be combined with clinical observations, patient history, and epidemiological information. The expected result is Negative.  Fact Sheet for Patients:  EntrepreneurPulse.com.au  Fact Sheet for Healthcare Providers:  IncredibleEmployment.be  This test is no t yet approved or cleared by the [place] FDA and  has been authorized for detection and/or diagnosis of SARS-CoV-2 by FDA under an Emergency Use Authorization (EUA). This EUA will remain  in effect (meaning this test can be used) for the duration of the COVID-19 declaration under Section 564(b)(1) of the Act, 21 U.S.C.section 360bbb-3(b)(1),  unless the authorization is terminated  or revoked sooner.       Influenza A by PCR NEGATIVE NEGATIVE Final   Influenza B by PCR NEGATIVE NEGATIVE Final    Comment: (NOTE) The Xpert Xpress SARS-CoV-2/FLU/RSV plus assay is intended as an aid in the diagnosis of influenza from Nasopharyngeal swab specimens and should not be used as a sole basis for treatment. Nasal washings and aspirates are unacceptable for Xpert Xpress SARS-CoV-2/FLU/RSV testing.  Fact Sheet for Patients: EntrepreneurPulse.com.au  Fact Sheet for Healthcare Providers: IncredibleEmployment.be  This test is not yet approved or cleared by the [place] FDA and has been authorized for detection and/or diagnosis of SARS-CoV-2 by FDA under an Emergency Use Authorization (EUA). This EUA will remain in effect (meaning this test can be used) for the duration of the COVID-19 declaration under Section 564(b)(1) of the Act, 21 U.S.C. section 360bbb-3(b)(1), unless the authorization is terminated or revoked.  Performed at [facility], [address] [place], [place] [address]   Resp Panel by RT-PCR (Flu A&B, Covid) Nasopharyngeal Swab     Status: None   Collection Time: 10/14/20  5:08 PM   Specimen: Nasopharyngeal Swab; Nasopharyngeal(NP) swabs in vial transport medium  Result Value Ref Range Status   SARS Coronavirus 2 by RT PCR NEGATIVE NEGATIVE Final    Comment: (NOTE) SARS-CoV-2 target nucleic acids are NOT DETECTED.  The SARS-CoV-2 RNA is generally detectable in upper respiratory specimens during the acute phase of infection. The lowest concentration of SARS-CoV-2 viral copies this assay can detect is 138 copies/mL. A negative result does not preclude SARS-Cov-2 infection and should not be used as the sole basis for treatment or other patient management decisions. A negative result may occur with  improper specimen collection/handling, submission of specimen  other than nasopharyngeal swab, presence of viral mutation(s) within the areas targeted by this assay, and inadequate number of viral copies(<138 copies/mL). A negative result must be combined with clinical observations, patient history, and epidemiological information. The expected result is  Negative.  Fact Sheet for Patients:  EntrepreneurPulse.com.au  Fact Sheet for Healthcare Providers:  IncredibleEmployment.be  This test is no t yet approved or cleared by the [place] FDA and  has been authorized for detection and/or diagnosis of SARS-CoV-2 by FDA under an Emergency Use Authorization (EUA). This EUA will remain  in effect (meaning this test can be used) for the duration of the COVID-19 declaration under Section 564(b)(1) of the Act, 21 U.S.C.section 360bbb-3(b)(1), unless the authorization is terminated  or revoked sooner.       Influenza A by PCR NEGATIVE NEGATIVE Final   Influenza B by PCR NEGATIVE NEGATIVE Final    Comment: (NOTE) The Xpert Xpress SARS-CoV-2/FLU/RSV plus assay is intended as an aid in the diagnosis of influenza from Nasopharyngeal swab specimens and should not be used as a sole basis for treatment. Nasal washings and aspirates are unacceptable for Xpert Xpress SARS-CoV-2/FLU/RSV testing.  Fact Sheet for Patients: EntrepreneurPulse.com.au  Fact Sheet for Healthcare Providers: IncredibleEmployment.be  This test is not yet approved or cleared by the [place] FDA and has been authorized for detection and/or diagnosis of SARS-CoV-2 by FDA under an Emergency Use Authorization (EUA). This EUA will remain in effect (meaning this test can be used) for the duration of the COVID-19 declaration under Section 564(b)(1) of the Act, 21 U.S.C. section 360bbb-3(b)(1), unless the authorization is terminated or revoked.  Performed at [facility], [place] [address] [place], [place] [address]   Blood Culture (routine x 2)     Status: None (Preliminary result)   Collection Time: 10/14/20  5:08 PM   Specimen: BLOOD RIGHT WRIST  Result Value Ref Range Status   Specimen Description   Final    BLOOD RIGHT WRIST Performed at [facility] [address], [place], [place] [address]    Special Requests   Final    BOTTLES DRAWN AEROBIC AND ANAEROBIC Blood Culture results may not be optimal due to an inadequate volume of blood received in culture bottles Performed at [facility] [address], [place], [place] [address]    Culture   Final    NO GROWTH < 12 HOURS Performed at [facility] [address], [place], [place] [address]    Report Status PENDING  Incomplete    Radiology Studies: DG Chest Port 1 View  Result Date: 10/14/2020 CLINICAL DATA:  Fever EXAM: PORTABLE CHEST 1 VIEW COMPARISON:  10/06/2020, runoff 07/26/2018 FINDINGS: Right lung is grossly clear. New small left pleural effusion and airspace disease at the left lung base. Stable cardiomediastinal silhouette with convex bulging at the right lower mediastinal silhouette corresponding to saccular aneurysm noted on prior CT. IMPRESSION: Development of small left pleural effusion with left lung base atelectasis or pneumonia. 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 Electronically Signed   By: Donavan Foil M.D.   On: 10/14/2020 18:14      Abigael Mogle T. [place]  If 7PM-7AM, please contact night-coverage [email or web address] 10/15/2020, 3:14 PM

## 2020-10-15 NOTE — Evaluation (Signed)
Clinical/Bedside Swallow Evaluation Patient Details  Name: LERIN JECH MRN: [id number] Date of Birth: August 22, 1923  Today's Date: 10/15/2020 Time: SLP Start Time (ACUTE ONLY): 1535 SLP Stop Time (ACUTE ONLY): 1610 SLP Time Calculation (min) (ACUTE ONLY): 35 min  Past Medical History:  Past Medical History:  Diagnosis Date  . Allergic rhinitis 03/27/2008   Qualifier: Diagnosis of  By: Jonny Ruiz MD, Len Blalock   . ANEMIA-NOS 06/25/2009   Qualifier: Diagnosis of  By: Ewing CMA (AAMA), Robin    . Arthritis   . Bilateral foot-drop 04/23/2015  . Chronic low back pain 04/23/2015  . Essential hypertension 02/22/2007   Qualifier: Diagnosis of  By: Briscoe Burns CMA, Alvy Beal    . GOUT 09/23/2009   Qualifier: Diagnosis of  By: Jonny Ruiz MD, Len Blalock   . Hyperlipidemia 02/22/2007   Qualifier: Diagnosis of  By: Briscoe Burns CMA, Alvy Beal    . Hypertension   . OSTEOPENIA 03/27/2008   Qualifier: Diagnosis of  By: Jonny Ruiz MD, Len Blalock   . Psychosis [facility]) 01/21/2017   Past Surgical History:  Past Surgical History:  Procedure Laterality Date  . arm fracture Left 1967   Fx. in 3 places and had a dropped wrist   HPI:  Pt is a 87 y.o. female with a known history of allergies, arthritis, HTN, HLD,  psychosis admitted with AMS - diagnosed with sepsis and LLL pna.  She currently lives at home with daughter and is on Home Hospice.  Recent hospital admit with AMS -- AMS likely 2/2 UTI, AKI.  She has a terminal diagnosis of Hypertensive Stage V Chronic Kidney Disease per MD notes. She is bedbound with a Stage 2 pressure ulcer on her sacrum during prior admit and notes from prior SLP indicate she was on a soft diet with pills crushed. Prior progressive cognitive decline noted by family. Swallow evaluation ordered.   Assessment / Plan / Recommendation Clinical Impression  Pt continues to present w/symptoms of oropharyngeal phase dysphagia due to Declined Cognitive and respiratory status.  Today she readily accepted po intake of soda, orange  juice, mashed potatoes and ice cream.  She has congested cough at baseline and with minimal intake was noted to have expiratory wheeze wtih elevated RR to 39.  Cough x2 observed post swallow *once with ice cream and after sequential swallows of thin.    Pt's risk for aspiration, as well as concern for meeting Nutritional needs fully, is present but can be reduced when following general aspiration precautions, Modifying diet consistency, and giving feeding assistance.  She required Mod+ verbal/ visual cues for slowing rate of liquid intake and ceasing speaking with po.  Anticipate her intake to be minimal given her respiratory status.  Recommend pt had pills with puree - whole if small.  Crushes meds in Puree at baseline per prior notes. Due to pt's respiratory and edentulous status, recommend continue (Puree) w/ Thin liquids and strict precautions. Anticipate pt's intake will be minimal due to her rapid dyspnea with any effort.  This is this pt's second admission in a week - recommend discussion with family regarding chronic aspiration risk and consideration for goals to provide comfort po and decrease hospital admits for pt's comfort and quality of life.  Advised NT and RN to recommendations and posted swallow precaution signs.  Hospice Liason arrived to room and was informed of SLP evaluation. Will follow up x1 for family education.  Of note, pt informed SLP that when it's her time to go, she is ready.  SLP Visit Diagnosis: Dysphagia, oral phase (R13.11) (declined Cognition)    Aspiration Risk  Mild aspiration risk;Risk for inadequate nutrition/hydration    Diet Recommendation Dysphagia 1 (Puree);Thin liquid   Medication Administration: Crushed with puree (baseline for safer swallowing) Compensations: Minimize environmental distractions;Slow rate;Small sips/bites;Lingual sweep for clearance of pocketing;Follow solids with liquid Postural Changes: Seated upright at 90 degrees;Remain upright for at  least 30 minutes after po intake    Other  Recommendations Recommended Consults:  (Palliative Care for GOC; Dietician) Oral Care Recommendations: Oral care BID;Oral care before and after PO;Staff/trained caregiver to provide oral care Other Recommendations:  (n/a)   Follow up Recommendations None      Frequency and Duration min 1 x/week (n/a)  1 week (n/a)       Prognosis Prognosis for Safe Diet Advancement: Fair Barriers to Reach Goals: Cognitive deficits;Time post onset;Severity of deficits (Edentulous status)      Swallow Study   General HPI: Pt is a 87 y.o. female with a known history of allergies, arthritis, HTN, HLD,  psychosis admitted with AMS - diagnosed with sepsis and LLL pna.  She currently lives at home with daughter and is on Home Hospice.  Recent hospital admit with AMS -- AMS likely 2/2 UTI, AKI.  She has a terminal diagnosis of Hypertensive Stage V Chronic Kidney Disease per MD notes. She is bedbound with a Stage 2 pressure ulcer on her sacrum during prior admit and notes from prior SLP indicate she was on a soft diet with pills crushed. Prior progressive cognitive decline noted by family. Swallow evaluation ordered. Type of Study: Bedside Swallow Evaluation Previous Swallow Assessment: approx one week ago bse, puree/thin Diet Prior to this Study: Dysphagia 1 (puree);Thin liquids Temperature Spikes Noted: No Respiratory Status: Nasal cannula (2) Behavior/Cognition: Alert;Cooperative;Distractible;Requires cueing Oral Cavity Assessment: Within Functional Limits Oral Care Completed by SLP: Yes (had pt brush her gums with toothbrush/paste) Oral Cavity - Dentition: Edentulous Vision:  (pt has arthritis, can hold her own cup, difficulty with fork/spoon) Self-Feeding Abilities: Total assist Patient Positioning: Upright in bed Baseline Vocal Quality: Low vocal intensity Volitional Cough: Other (Comment) (congested cough at baseline) Volitional Swallow: Unable to elicit     Oral/Motor/Sensory Function Overall Oral Motor/Sensory Function: Other (comment) (? minimal facial asymmetry of the right)   Ice Chips Ice chips: Not tested   Thin Liquid Thin Liquid: Impaired (min) Presentation: Straw (supported; ~3-4 ozs total) Oral Phase Impairments: Reduced labial seal (during prep stage) Oral Phase Functional Implications:  (none) Pharyngeal  Phase Impairments:  (increased dyspnea with RR 39 toward end of snack, inhalation post swallow) Other Comments: pt able to self feed with straw - at times with inconsistent lip seal on straw - no indications of aspiraton immediately post-swallow    Nectar Thick Nectar Thick Liquid: Not tested   Honey Thick Honey Thick Liquid: Not tested   Puree Puree: Within functional limits (adequate) Presentation: Spoon (fed; 10+ trials) Other Comments: minimal oral delay with mashed potatoes presumed - pt benefits from moderate cues to cease talking with po, cough post swallow of icecream - ? mixed wtih secretins and aspirated?   Solid     Solid: Not tested Other Comments: edentulous      Chales Abrahams 10/15/2020,6:29 PM   Rolena Infante, MS [facility] SLP Acute Rehab Services Office [phone] Pager [phone]

## 2020-10-15 NOTE — ED Notes (Signed)
Called [facility] to clarify reasons why pt is on hospice. Nurse on call will call this RN back when they get a chance

## 2020-10-15 NOTE — ED Notes (Signed)
Per Alanda Slim, MD pt is allowed thickened liquid diet

## 2020-10-15 NOTE — ED Notes (Signed)
Per hospice nurse at [facility]. Pt is on hospice for CKD, COPD, and chronic respiratory failure. Pt is a full code per hospice records, daughter is not ready to make DNR decision

## 2020-10-15 NOTE — ED Notes (Signed)
IP handoff done via Secure chat with Tama Gander, RN

## 2020-10-15 NOTE — ED Notes (Signed)
Pt asleep and resting comfortably at this time.  

## 2020-10-15 NOTE — Plan of Care (Signed)

## 2020-10-16 DIAGNOSIS — Z66 Do not resuscitate: Secondary | ICD-10-CM

## 2020-10-16 DIAGNOSIS — L89312 Pressure ulcer of right buttock, stage 2: Secondary | ICD-10-CM

## 2020-10-16 DIAGNOSIS — R5381 Other malaise: Secondary | ICD-10-CM

## 2020-10-16 DIAGNOSIS — Z515 Encounter for palliative care: Secondary | ICD-10-CM

## 2020-10-16 DIAGNOSIS — D649 Anemia, unspecified: Secondary | ICD-10-CM

## 2020-10-16 DIAGNOSIS — R1312 Dysphagia, oropharyngeal phase: Secondary | ICD-10-CM

## 2020-10-16 LAB — URINE CULTURE: Culture: 1000 — AB

## 2020-10-16 LAB — CBC
HCT: 31.4 % — ABNORMAL LOW (ref 36.0–46.0)
Hemoglobin: 9.1 g/dL — ABNORMAL LOW (ref 12.0–15.0)
MCH: 28 pg (ref 26.0–34.0)
MCHC: 29 g/dL — ABNORMAL LOW (ref 30.0–36.0)
MCV: 96.6 fL (ref 80.0–100.0)
Platelets: 315 10*3/uL (ref 150–400)
RBC: 3.25 MIL/uL — ABNORMAL LOW (ref 3.87–5.11)
RDW: 17 % — ABNORMAL HIGH (ref 11.5–15.5)
WBC: 13.2 10*3/uL — ABNORMAL HIGH (ref 4.0–10.5)
nRBC: 0 % (ref 0.0–0.2)

## 2020-10-16 LAB — LACTIC ACID, PLASMA: Lactic Acid, Venous: 1.7 mmol/L (ref 0.5–1.9)

## 2020-10-16 LAB — LEGIONELLA PNEUMOPHILA SEROGP 1 UR AG: L. pneumophila Serogp 1 Ur Ag: NEGATIVE

## 2020-10-16 LAB — MAGNESIUM: Magnesium: 1.7 mg/dL (ref 1.7–2.4)

## 2020-10-16 MED ORDER — TRIAMTERENE-HCTZ 37.5-25 MG PO TABS: 0.5000 | ORAL_TABLET | Freq: Every day | ORAL | 1 refills | Status: AC

## 2020-10-16 MED ORDER — MAGNESIUM SULFATE 2 GM/50ML IV SOLN
2.0000 g | Freq: Once | INTRAVENOUS | Status: AC
  Administered 2020-10-16: 2 g via INTRAVENOUS
  Filled 2020-10-16: qty 50

## 2020-10-16 MED ORDER — GERHARDT'S BUTT CREAM
TOPICAL_CREAM | Freq: Two times a day (BID) | CUTANEOUS | Status: DC
  Filled 2020-10-16: qty 1

## 2020-10-16 MED ORDER — CEFDINIR 300 MG PO CAPS: 300.0000 mg | ORAL_CAPSULE | Freq: Two times a day (BID) | ORAL | 0 refills | Status: AC

## 2020-10-16 MED ORDER — AZITHROMYCIN 250 MG PO TABS: 250.0000 mg | ORAL_TABLET | Freq: Every day | ORAL | 0 refills | Status: AC

## 2020-10-16 NOTE — Progress Notes (Signed)
   10/16/20 0619  Urine Characteristics  Urinary Interventions Bladder scan  Bladder Scan Volume (mL) 0 mL (bladder scan x2)  Pt voided x 1 on this shift.  Pt bladder scanned.  No  urine noted in bladder.  IVF continues to infuse.  Pt with decreased po intake as charted throughout shift.  Will continue to push po and pass to next nurse in report.

## 2020-10-16 NOTE — Discharge Summary (Signed)
Physician Discharge Summary  Sydney Lowery ZRA:[id number] DOB: 1923-09-27 DOA: 10/14/2020  PCP: Clovia Cuff, MD  Admit date: 10/14/2020 Discharge date: 10/16/2020  Admitted From: Hospice home Disposition: Home with hospice  Recommendations for Outpatient Follow-up:  1. Follow ups as below. 2. Please obtain CBC/BMP/Mag at follow up 3. Please follow up on the following pending results: None   Discharge Condition: Stable CODE STATUS: DNR/DNI   Follow-up Information    Clovia Cuff, MD. Schedule an appointment as soon as possible for a visit in 1 week(s).   Specialty: Internal Medicine Contact information: [place] [address] [phone]                Hospital Course: 87 year old F with PMH of dementia, HTN, DVT not on AC, dysphagia, recent UTI and stage II decubitus ulcer presenting with altered mental status, fever, productive cough and hypotension, and admitted for severe sepsis in the setting of LLL pneumonia.  Cultures drawn.  Resuscitated with IV fluid.  She was a started on IV ceftriaxone and azithromycin, and admitted.  Patient was continued on IV ceftriaxone and azithromycin.  Respiratory symptoms, mental status and leukocytosis improved.  Lactic acidosis resolved.  She was evaluated by SLP who recommended dysphagia 1 diet.  She maintained appropriate saturation on room air.  She is severely debilitated for ambulatory saturation assessment.  He has been discharged on p.o. cefdinir for 4 more days and azithromycin for 3 more days to complete treatment course.  See individual problem list below for more hospital course.  Discharge Diagnoses:  Severe sepsis due to possible aspiration pneumonia and patient with dysphagia: POA.  Met criteria with leukocytosis, tachycardia, tachypnea, hypotension, encephalopathy and lactic acidosis.  Patient was supposed to be on dysphagia 1 diet but has been eating regular diet per daughter.  Sepsis physiology  resolved except for mild leukocytosis that has improved.  Blood cultures negative. -IV ceftriaxone and azithromycin>> p.o. cefdinir and azithromycin to complete treatment course -Dysphagia 1 diet per SLP recommendation.  Emphasized the importance of this with daughter -Recheck CBC at follow-up.  Acute respiratory failure with hypoxia: Desaturated to 89% on RA and started on 2 L by nasal cannula.  Likely due to the above.  Normal saturation on room air at rest..  Patient is not ambulatory for ambulatory saturation assessment  Dysphagia-SLP recommended dysphagia 1 diet last hospitalization.  However, her daughter has been feeding her regular diet at home.  -See discussion above  Normocytic anemia:  H&H at baseline. Recent Labs    10/06/20 1453 10/07/20 0406 10/08/20 0436 10/14/20 1708 10/15/20 0337 10/16/20 0347  HGB 9.7* 9.7* 9.3* 10.3* 8.3* 9.1*    Dementia without behavioral disturbance/anxiety: Stable. -Reorientation and delirium precautions -Assistance with feeding and hydration -Continue home medications  Leukocytosis/bandemia: Likely due to #1.  Improved. -Recheck CBC in 1 to 2 weeks  Lactic acidosis: Likely due to #1.  Resolved.  Goal of care: Admitted as full code.  I have discussed the pros and cons of resuscitation including CPR and intubation with patient's daughter over the phone.  I recommended DNR/DNI.  Daughter in agreement.  CODE STATUS changed to DNR/DNI.  Patient is enrolled with home hospice.   Body mass index is 24.91 kg/m.        Pressure skin injury: POA -Gerhardts twice daily to buttocks.  Pressure Injury 10/06/20 Buttocks Left Stage 2 -  Partial thickness loss of dermis presenting as a shallow open injury with a red, pink wound bed  without slough. Stage 2 on left buttocks (Active)  10/06/20 2335  Location: Buttocks  Location Orientation: Left  Staging: Stage 2 -  Partial thickness loss of dermis presenting as a shallow open injury with a  red, pink wound bed without slough.  Wound Description (Comments): Stage 2 on left buttocks  Present on Admission: Yes     Pressure Injury 10/06/20 Right Stage 2 -  Partial thickness loss of dermis presenting as a shallow open injury with a red, pink wound bed without slough. Stage 2 on right buttocks (Active)  10/06/20 2341  Location:   Location Orientation: Right  Staging: Stage 2 -  Partial thickness loss of dermis presenting as a shallow open injury with a red, pink wound bed without slough.  Wound Description (Comments): Stage 2 on right buttocks  Present on Admission: Yes    Discharge Exam: Vitals:   10/16/20 0600 10/16/20 1248  BP: 110/71 104/66  Pulse: 72 97  Resp: 20 20  Temp: 98.4 F (36.9 C) 98.6 F (37 C)  SpO2: 98% 92%    GENERAL: No apparent distress.  Nontoxic. HEENT: MMM.  Vision and hearing grossly intact.  NECK: Supple.  No apparent JVD.  RESP: On RA.  No IWOB.  Fair aeration bilaterally. CVS:  RRR. Heart sounds normal.  ABD/GI/GU: Bowel sounds present. Soft. Non tender.  MSK/EXT:  Moves extremities.  BLE weakness SKIN: no apparent skin lesion or wound NEURO: Awake.  Oriented to self, [facility] and [place] but not to situation.  No apparent focal neuro deficit. PSYCH: Calm. Normal affect.   Discharge Instructions  Discharge Instructions    Call MD for:  difficulty breathing, headache or visual disturbances   Complete by: As directed    Call MD for:  extreme fatigue   Complete by: As directed    Call MD for:  persistant nausea and vomiting   Complete by: As directed    Call MD for:  severe uncontrolled pain   Complete by: As directed    Call MD for:  temperature >100.4   Complete by: As directed    Diet - low sodium heart healthy   Complete by: As directed    Dysphagia 1 (Puree);Thin liquid  Medication: Crushed with puree (baseline for safer swallowing) Compensations: Minimize environmental distractions; Slow rate;Small sips/bites;Lingual sweep for  clearance of pocketing Follow solids with liquid Postural Changes: Seated upright at 90 degrees;Remain upright for at least 30 minutes after po intake   Discharge instructions   Complete by: As directed    It has been a pleasure taking care of you!  You were hospitalized due to fever, cough and shortness of breath likely due to pneumonia.  You were treated with IV antibiotics and the symptoms improved to the point we think it is safe to let you go home and complete your treatment with oral antibiotics.  It is very important that you take the whole course of antibiotics. We also recommend holding your blood pressure medication for the next 3 to 4 days.  Please follow-up with your primary care doctor in 1 to 2 weeks or sooner if needed   Take care,   Discharge wound care:   Complete by: As directed    Bilateral buttock pressure ischemic injury-stage II Recommend frequent turning, daily home dressing to offload pressure and monitoring for signs of infection   Increase activity slowly   Complete by: As directed      Allergies as of 10/16/2020  Reactions   Aspirin Other (See Comments)   Has diverticulitis, caused bleeding.   Advil [ibuprofen] Swelling   "tongue swelling"   Atorvastatin    Indomethacin    REACTION: gi upset   Morphine And Related Swelling   Swelling in mouth   Penicillins    Sulfa Antibiotics Itching      Medication List    TAKE these medications   azithromycin 250 MG tablet Commonly known as: Zithromax Take 1 tablet (250 mg total) by mouth daily for 3 days.   cefdinir 300 MG capsule Commonly known as: OMNICEF Take 1 capsule (300 mg total) by mouth 2 (two) times daily.   Combivent Respimat 20-100 MCG/ACT Aers respimat Generic drug: Ipratropium-Albuterol Inhale 1 puff into the lungs every 6 (six) hours.   cyanocobalamin 1000 MCG tablet Take 1 tablet (1,000 mcg total) by mouth daily.   ECHINACEA C COMPLETE PO Take 1 tablet by mouth daily.   iron  polysaccharides 150 MG capsule Commonly known as: NIFEREX Take 1 capsule (150 mg total) by mouth daily.   LORazepam 0.5 MG tablet Commonly known as: ATIVAN Take 0.5 mg by mouth daily. Daughter states that pt was started on lorazepam (strength unknown) by hospice once daily.   oxyCODONE 5 MG immediate release tablet Commonly known as: Oxy IR/ROXICODONE Take 5 mg by mouth daily. Pt's daughter states that she usually gives her 1 tablet daily but will give her an additional tablet during the day if her mother states that she's in pain.   QUEtiapine 25 MG tablet Commonly known as: SEROquel Take 1 tablet (25 mg total) by mouth 2 (two) times daily. What changed: additional instructions   simvastatin 20 MG tablet Commonly known as: ZOCOR TAKE 1 TABLET BY MOUTH  DAILY What changed:   how much to take  how to take this  when to take this  additional instructions   traZODone 100 MG tablet Commonly known as: DESYREL Take 100 mg by mouth at bedtime.   triamterene-hydrochlorothiazide 37.5-25 MG tablet Commonly known as: MAXZIDE-25 Take 0.5 tablets by mouth daily. Start taking on: October 20, 2020 What changed: These instructions start on October 20, 2020. If you are unsure what to do until then, ask your doctor or other care provider.   Turmeric 500 MG Tabs Take 500 mg by mouth daily.   VITAMIN D-3 PO Take 1,000 Int'l Units by mouth.            Discharge Care Instructions  (From admission, onward)         Start     Ordered   10/16/20 0000  Discharge wound care:       Comments: Bilateral buttock pressure ischemic injury-stage II Recommend frequent turning, daily home dressing to offload pressure and monitoring for signs of infection   10/16/20 1043          Consultations:  None  Procedures/Studies   CT HEAD WO CONTRAST  Result Date: 10/07/2020 CLINICAL DATA:  Altered mental status EXAM: CT HEAD WITHOUT CONTRAST TECHNIQUE: Contiguous axial images were obtained  from the base of the skull through the vertex without intravenous contrast. COMPARISON:  None. FINDINGS: Brain: There is moderate diffuse atrophy. There is no intracranial mass, hemorrhage, extra-axial fluid collection, or midline shift. There is mild decreased attenuation in the centra semiovale bilaterally. No acute appearing infarct is evident. Vascular: No hyperdense vessel. There is calcification in each carotid siphon region. Skull: Bones are hyperostotic.  Bony calvarium appears intact. Sinuses/Orbits: There is mucosal thickening  in the posterior right sphenoid region. There is mucosal thickening in several ethmoid air cells. Visualized orbits appear grossly symmetric bilaterally. Other: Visualized mastoid air cells are clear. There is debris in the left external auditory canal. IMPRESSION: Atrophy with relatively mild periventricular small vessel disease. No acute infarct. No mass or hemorrhage. Foci of arterial vascular calcification noted. Areas of paranasal sinus disease noted. Probable cerumen in the left external auditory canal. Electronically Signed   By: Lowella Grip III M.D.   On: 10/07/2020 11:11   DG Chest Port 1 View  Result Date: 10/14/2020 CLINICAL DATA:  Fever EXAM: PORTABLE CHEST 1 VIEW COMPARISON:  10/06/2020, runoff 07/26/2018 FINDINGS: Right lung is grossly clear. New small left pleural effusion and airspace disease at the left lung base. Stable cardiomediastinal silhouette with convex bulging at the right lower mediastinal silhouette corresponding to saccular aneurysm noted on prior CT. IMPRESSION: Development of small left pleural effusion with left lung base atelectasis or pneumonia. 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 Electronically Signed   By: Donavan Foil M.D.   On: 10/14/2020 18:14   DG Chest Port 1 View  Result Date: 10/06/2020 CLINICAL DATA:  Altered mental status x1 day,  possible sepsis EXAM: PORTABLE CHEST 1 VIEW COMPARISON:  None. FINDINGS: Lungs are clear.  No pleural effusion or pneumothorax. Cardiomegaly.  Thoracic aortic atherosclerosis. IMPRESSION: No evidence of acute cardiopulmonary disease. Electronically Signed   By: Julian Hy M.D.   On: 10/06/2020 15:02       The results of significant diagnostics from this hospitalization (including imaging, microbiology, ancillary and laboratory) are listed below for reference.     Microbiology: Recent Results (from the past 240 hour(s))  Resp Panel by RT-PCR (Flu A&B, Covid) Nasopharyngeal Swab     Status: None   Collection Time: 10/14/20  5:08 PM   Specimen: Nasopharyngeal Swab; Nasopharyngeal(NP) swabs in vial transport medium  Result Value Ref Range Status   SARS Coronavirus 2 by RT PCR NEGATIVE NEGATIVE Final    Comment: (NOTE) SARS-CoV-2 target nucleic acids are NOT DETECTED.  The SARS-CoV-2 RNA is generally detectable in upper respiratory specimens during the acute phase of infection. The lowest concentration of SARS-CoV-2 viral copies this assay can detect is 138 copies/mL. A negative result does not preclude SARS-Cov-2 infection and should not be used as the sole basis for treatment or other patient management decisions. A negative result may occur with  improper specimen collection/handling, submission of specimen other than nasopharyngeal swab, presence of viral mutation(s) within the areas targeted by this assay, and inadequate number of viral copies(<138 copies/mL). A negative result must be combined with clinical observations, patient history, and epidemiological information. The expected result is Negative.  Fact Sheet for Patients:  EntrepreneurPulse.com.au  Fact Sheet for Healthcare Providers:  IncredibleEmployment.be  This test is no t yet approved or cleared by the [place] FDA and  has been authorized for detection and/or  diagnosis of SARS-CoV-2 by FDA under an Emergency Use Authorization (EUA). This EUA will remain  in effect (meaning this test can be used) for the duration of the COVID-19 declaration under Section 564(b)(1) of the Act, 21 U.S.C.section 360bbb-3(b)(1), unless the authorization is terminated  or revoked sooner.       Influenza A by PCR NEGATIVE NEGATIVE Final   Influenza B by PCR NEGATIVE NEGATIVE Final    Comment: (NOTE) The Xpert Xpress SARS-CoV-2/FLU/RSV plus assay is intended  as an aid in the diagnosis of influenza from Nasopharyngeal swab specimens and should not be used as a sole basis for treatment. Nasal washings and aspirates are unacceptable for Xpert Xpress SARS-CoV-2/FLU/RSV testing.  Fact Sheet for Patients: EntrepreneurPulse.com.au  Fact Sheet for Healthcare Providers: IncredibleEmployment.be  This test is not yet approved or cleared by the [place] FDA and has been authorized for detection and/or diagnosis of SARS-CoV-2 by FDA under an Emergency Use Authorization (EUA). This EUA will remain in effect (meaning this test can be used) for the duration of the COVID-19 declaration under Section 564(b)(1) of the Act, 21 U.S.C. section 360bbb-3(b)(1), unless the authorization is terminated or revoked.  Performed at [facility], [place] [address] [place], [place] [address]   Blood Culture (routine x 2)     Status: None (Preliminary result)   Collection Time: 10/14/20  5:08 PM   Specimen: BLOOD RIGHT WRIST  Result Value Ref Range Status   Specimen Description   Final    BLOOD RIGHT WRIST Performed at [facility] [address], [place], [place] [address]    Special Requests   Final    BOTTLES DRAWN AEROBIC AND ANAEROBIC Blood Culture results may not be optimal due to an inadequate volume of blood received in culture bottles Performed at [facility] [address], [place], [place] [address]    Culture   Final    NO GROWTH 2 DAYS Performed at [facility] [address], [place], [place] [address]    Report Status PENDING  Incomplete  Urine culture     Status: Abnormal   Collection Time: 10/14/20  6:09 PM   Specimen: In/Out Cath Urine  Result Value Ref Range Status   Specimen Description   Final    IN/OUT CATH URINE Performed at [facility] [address] [place], [place] [address]    Special Requests   Final    NONE Performed at [facility], [place] [address] [place], [place] [address]    Culture 1,000 COLONIES/mL YEAST (A)  Final   Report Status 10/16/2020 FINAL  Final     Labs:  CBC: Recent Labs  Lab 10/14/20 1708 10/15/20 0337 10/16/20 0347  WBC 16.3* 16.6* 13.2*  NEUTROABS 14.0*  --   --   HGB 10.3* 8.3* 9.1*  HCT 35.5* 27.2* 31.4*  MCV 97.0 95.1 96.6  PLT 354 264 315   BMP &GFR Recent Labs  Lab 10/14/20 1708 10/15/20 0337 10/16/20 0347  NA 137 138  --   K 3.9 4.0  --   CL 95* 99  --   CO2 31 29  --   GLUCOSE 106* 87  --   BUN 14 12  --   CREATININE 0.85 0.73  --   CALCIUM 8.9 8.1*  --   MG  --   --  1.7   Estimated Creatinine Clearance: 40.3 mL/min (by C-G formula based on SCr of 0.73 mg/dL). Liver & Pancreas: Recent Labs  Lab 10/14/20 1708  AST 21  ALT 14  ALKPHOS 68  BILITOT 1.0  PROT 7.5  ALBUMIN 2.7*   No results for input(s): LIPASE, AMYLASE in the last 168 hours. No results for input(s): AMMONIA in the last 168 hours. Diabetic: No results for input(s): HGBA1C in the last 72 hours. No results for input(s): GLUCAP in the last 168 hours. Cardiac Enzymes: No results for input(s): CKTOTAL, CKMB, CKMBINDEX, TROPONINI in the  last 168 hours. No results for input(s): PROBNP in the last 8760 hours. Coagulation Profile: Recent Labs  Lab 10/14/20 1708  INR 1.2   Thyroid Function Tests: No results for input(s): TSH, T4TOTAL, FREET4, T3FREE, THYROIDAB  in the last 72 hours. Lipid Profile: No results for input(s): CHOL, HDL, LDLCALC, TRIG, CHOLHDL, LDLDIRECT in the last 72 hours. Anemia Panel: No results for input(s): VITAMINB12, FOLATE, FERRITIN, TIBC, IRON, RETICCTPCT in the last 72 hours. Urine analysis:    Component Value Date/Time   COLORURINE AMBER (A) 10/14/2020 1809   APPEARANCEUR CLEAR 10/14/2020 1809   LABSPEC 1.018 10/14/2020 1809   PHURINE 5.0 10/14/2020 1809   GLUCOSEU NEGATIVE 10/14/2020 1809   GLUCOSEU NEGATIVE 03/04/2017 1320   HGBUR NEGATIVE 10/14/2020 1809   BILIRUBINUR NEGATIVE 10/14/2020 1809   KETONESUR NEGATIVE 10/14/2020 1809   PROTEINUR 30 (A) 10/14/2020 1809   UROBILINOGEN 0.2 03/04/2017 1320   NITRITE NEGATIVE 10/14/2020 1809   LEUKOCYTESUR MODERATE (A) 10/14/2020 1809   Sepsis Labs: Invalid input(s): PROCALCITONIN, LACTICIDVEN   Time coordinating discharge: 40 minutes  SIGNED:  Mercy Riding, MD  [facility] 10/16/2020, 6:04 PM  If 7PM-7AM, please contact night-coverage [email or web address]

## 2020-10-16 NOTE — Consult Note (Signed)
WOC Nurse Consult Note: Reason for Consult:Moisture associated skin damage to right gluteal fold.  Breakdown noted on admission and noted as stage 2. THis is not over a bony prominence and in area frequently exposed to incontinence.  Wound type:MASD Pressure Injury POA: Yes  Was noted on admission and staged.  Measurement: 3 cm x 1.2 cm x 0.1 cm  Wound bed:red and moist Drainage (amount, consistency, odor) none Periwound:intact frequently moist Dressing procedure/placement/frequency:Gerhardts twice daily to buttocks.  Will not follow at this time.  Please re-consult if needed.  Maple Hudson MSN, RN, FNP-BC CWON Wound, Ostomy, Continence Nurse Pager [phone]

## 2020-10-16 NOTE — Progress Notes (Signed)
   10/16/20 0330  Intake (mL)  P.O. 0 mL  Encouraged pt to drink but refuses at this time.  Will attempt to offer drink later.

## 2020-10-16 NOTE — Plan of Care (Signed)

## 2020-10-16 NOTE — TOC Progression Note (Signed)
Transition of Care [facility]) - Progression Note    Patient Details  Name: Sydney Lowery MRN: [id number] Date of Birth: [birth year]  [facility] CM/SW Contact  Geni Bers, RN Phone Number: 10/16/2020, 1:50 PM  Clinical Narrative:    Spoke with pt's daughter [name], pt may return home with [facility]/hospice via [facility] after 3PM. [facility] was called for transportation home.    Expected Discharge Plan: Home w Hospice Care Barriers to Discharge: No Barriers Identified  Expected Discharge Plan and Services Expected Discharge Plan: Home w Hospice Care       Living arrangements for the past 2 months: Single Family Home Expected Discharge Date: 10/16/20                                     Social Determinants of Health (SDOH) Interventions    Readmission Risk Interventions No flowsheet data found.

## 2020-10-16 NOTE — Progress Notes (Signed)
   10/16/20 0619  Intake (mL)  P.O. 0 mL  Pt refused po intake at this time.

## 2020-10-19 LAB — CULTURE, BLOOD (ROUTINE X 2): Culture: NO GROWTH

## 2021-03-17 ENCOUNTER — Emergency Department (HOSPITAL_COMMUNITY)

## 2021-03-17 ENCOUNTER — Encounter (HOSPITAL_COMMUNITY): Payer: Self-pay

## 2021-03-17 ENCOUNTER — Other Ambulatory Visit: Payer: Self-pay

## 2021-03-17 ENCOUNTER — Emergency Department (HOSPITAL_COMMUNITY)
Admission: EM | Admit: 2021-03-17 | Discharge: 2021-04-04 | Disposition: E | Attending: Emergency Medicine | Admitting: Emergency Medicine

## 2021-03-17 DIAGNOSIS — R6521 Severe sepsis with septic shock: Secondary | ICD-10-CM | POA: Insufficient documentation

## 2021-03-17 DIAGNOSIS — F17210 Nicotine dependence, cigarettes, uncomplicated: Secondary | ICD-10-CM | POA: Diagnosis not present

## 2021-03-17 DIAGNOSIS — R4182 Altered mental status, unspecified: Secondary | ICD-10-CM | POA: Insufficient documentation

## 2021-03-17 DIAGNOSIS — Z79899 Other long term (current) drug therapy: Secondary | ICD-10-CM | POA: Diagnosis not present

## 2021-03-17 DIAGNOSIS — K5792 Diverticulitis of intestine, part unspecified, without perforation or abscess without bleeding: Secondary | ICD-10-CM | POA: Diagnosis not present

## 2021-03-17 DIAGNOSIS — Z20822 Contact with and (suspected) exposure to covid-19: Secondary | ICD-10-CM | POA: Diagnosis not present

## 2021-03-17 DIAGNOSIS — I10 Essential (primary) hypertension: Secondary | ICD-10-CM | POA: Insufficient documentation

## 2021-03-17 DIAGNOSIS — R062 Wheezing: Secondary | ICD-10-CM | POA: Diagnosis present

## 2021-03-17 DIAGNOSIS — K631 Perforation of intestine (nontraumatic): Secondary | ICD-10-CM

## 2021-03-17 DIAGNOSIS — A419 Sepsis, unspecified organism: Secondary | ICD-10-CM | POA: Diagnosis not present

## 2021-03-17 DIAGNOSIS — R0602 Shortness of breath: Secondary | ICD-10-CM | POA: Insufficient documentation

## 2021-03-17 LAB — CBC WITH DIFFERENTIAL/PLATELET
Abs Immature Granulocytes: 0 10*3/uL (ref 0.00–0.07)
Basophils Absolute: 0 10*3/uL (ref 0.0–0.1)
Basophils Relative: 0 %
Eosinophils Absolute: 0 10*3/uL (ref 0.0–0.5)
Eosinophils Relative: 0 %
HCT: 25.9 % — ABNORMAL LOW (ref 36.0–46.0)
Hemoglobin: 7.9 g/dL — ABNORMAL LOW (ref 12.0–15.0)
Lymphocytes Relative: 1 %
Lymphs Abs: 0.3 10*3/uL — ABNORMAL LOW (ref 0.7–4.0)
MCH: 32.1 pg (ref 26.0–34.0)
MCHC: 30.5 g/dL (ref 30.0–36.0)
MCV: 105.3 fL — ABNORMAL HIGH (ref 80.0–100.0)
Monocytes Absolute: 0.8 10*3/uL (ref 0.1–1.0)
Monocytes Relative: 3 %
Neutro Abs: 26.1 10*3/uL — ABNORMAL HIGH (ref 1.7–7.7)
Neutrophils Relative %: 96 %
Platelets: 460 10*3/uL — ABNORMAL HIGH (ref 150–400)
RBC: 2.46 MIL/uL — ABNORMAL LOW (ref 3.87–5.11)
RDW: 15.4 % (ref 11.5–15.5)
WBC: 27.2 10*3/uL — ABNORMAL HIGH (ref 4.0–10.5)
nRBC: 0 % (ref 0.0–0.2)
nRBC: 0 /100 WBC

## 2021-03-17 LAB — COMPREHENSIVE METABOLIC PANEL
ALT: 14 U/L (ref 0–44)
AST: 26 U/L (ref 15–41)
Albumin: 1.5 g/dL — ABNORMAL LOW (ref 3.5–5.0)
Alkaline Phosphatase: 85 U/L (ref 38–126)
Anion gap: 14 (ref 5–15)
BUN: 34 mg/dL — ABNORMAL HIGH (ref 8–23)
CO2: 23 mmol/L (ref 22–32)
Calcium: 8.3 mg/dL — ABNORMAL LOW (ref 8.9–10.3)
Chloride: 105 mmol/L (ref 98–111)
Creatinine, Ser: 1.46 mg/dL — ABNORMAL HIGH (ref 0.44–1.00)
GFR, Estimated: 33 mL/min — ABNORMAL LOW (ref >60)
Glucose, Bld: 129 mg/dL — ABNORMAL HIGH (ref 70–99)
Potassium: 3.9 mmol/L (ref 3.5–5.1)
Sodium: 142 mmol/L (ref 135–145)
Total Bilirubin: 1 mg/dL (ref 0.3–1.2)
Total Protein: 5.6 g/dL — ABNORMAL LOW (ref 6.5–8.1)

## 2021-03-17 LAB — AMMONIA: Ammonia: 105 umol/L — ABNORMAL HIGH (ref 9–35)

## 2021-03-17 LAB — I-STAT ARTERIAL BLOOD GAS, ED
Acid-Base Excess: 0 mmol/L (ref 0.0–2.0)
Bicarbonate: 22.9 mmol/L (ref 20.0–28.0)
Calcium, Ion: 1.15 mmol/L (ref 1.15–1.40)
HCT: 25 % — ABNORMAL LOW (ref 36.0–46.0)
Hemoglobin: 8.5 g/dL — ABNORMAL LOW (ref 12.0–15.0)
O2 Saturation: 99 %
Patient temperature: 99.1
Potassium: 4 mmol/L (ref 3.5–5.1)
Sodium: 141 mmol/L (ref 135–145)
TCO2: 24 mmol/L (ref 22–32)
pCO2 arterial: 30.7 mmHg — ABNORMAL LOW (ref 32.0–48.0)
pH, Arterial: 7.482 — ABNORMAL HIGH (ref 7.350–7.450)
pO2, Arterial: 112 mmHg — ABNORMAL HIGH (ref 83.0–108.0)

## 2021-03-17 LAB — I-STAT CHEM 8, ED
BUN: 31 mg/dL — ABNORMAL HIGH (ref 8–23)
Calcium, Ion: 1.1 mmol/L — ABNORMAL LOW (ref 1.15–1.40)
Chloride: 107 mmol/L (ref 98–111)
Creatinine, Ser: 1.3 mg/dL — ABNORMAL HIGH (ref 0.44–1.00)
Glucose, Bld: 127 mg/dL — ABNORMAL HIGH (ref 70–99)
HCT: 19 % — ABNORMAL LOW (ref 36.0–46.0)
Hemoglobin: 6.5 g/dL — CL (ref 12.0–15.0)
Potassium: 3.9 mmol/L (ref 3.5–5.1)
Sodium: 141 mmol/L (ref 135–145)
TCO2: 23 mmol/L (ref 22–32)

## 2021-03-17 LAB — LACTIC ACID, PLASMA
Lactic Acid, Venous: 6.6 mmol/L (ref 0.5–1.9)
Lactic Acid, Venous: 5.9 mmol/L (ref 0.5–1.9)

## 2021-03-17 LAB — PROTIME-INR
INR: 1.3 — ABNORMAL HIGH (ref 0.8–1.2)
Prothrombin Time: 15.9 seconds — ABNORMAL HIGH (ref 11.4–15.2)

## 2021-03-17 LAB — RESP PANEL BY RT-PCR (FLU A&B, COVID) ARPGX2
Influenza A by PCR: NEGATIVE
Influenza B by PCR: NEGATIVE
SARS Coronavirus 2 by RT PCR: NEGATIVE

## 2021-03-17 LAB — TROPONIN I (HIGH SENSITIVITY)
Troponin I (High Sensitivity): 17 ng/L (ref <18)
Troponin I (High Sensitivity): 27 ng/L — ABNORMAL HIGH (ref <18)

## 2021-03-17 LAB — BRAIN NATRIURETIC PEPTIDE: B Natriuretic Peptide: 63.5 pg/mL (ref 0.0–100.0)

## 2021-03-17 LAB — APTT: aPTT: 20 seconds — ABNORMAL LOW (ref 24–36)

## 2021-03-17 MED ORDER — FENTANYL CITRATE PF 50 MCG/ML IJ SOSY
25.0000 ug | PREFILLED_SYRINGE | Freq: Once | INTRAMUSCULAR | Status: AC
  Administered 2021-03-18: 25 ug via INTRAVENOUS

## 2021-03-17 MED ORDER — CEFEPIME HCL 2 G IJ SOLR: 2.0000 g | INTRAMUSCULAR | Status: DC

## 2021-03-17 MED ORDER — SODIUM CHLORIDE 0.9 % IV SOLN
2.0000 g | Freq: Once | INTRAVENOUS | Status: DC
  Administered 2021-03-17: 2 g via INTRAVENOUS
  Filled 2021-03-17: qty 2

## 2021-03-17 MED ORDER — VANCOMYCIN HCL 1250 MG/250ML IV SOLN
1250.0000 mg | Freq: Once | INTRAVENOUS | Status: AC
  Administered 2021-03-17: 1250 mg via INTRAVENOUS
  Filled 2021-03-17: qty 250

## 2021-03-17 MED ORDER — MORPHINE BOLUS VIA INFUSION
5.0000 mg | INTRAVENOUS | Status: DC | PRN
  Filled 2021-03-17: qty 5

## 2021-03-17 MED ORDER — POLYVINYL ALCOHOL 1.4 % OP SOLN: 1.0000 [drp] | Freq: Four times a day (QID) | OPHTHALMIC | Status: DC | PRN

## 2021-03-17 MED ORDER — MIDAZOLAM HCL 2 MG/2ML IJ SOLN: 2.0000 mg | INTRAMUSCULAR | Status: DC | PRN

## 2021-03-17 MED ORDER — GLYCOPYRROLATE 0.2 MG/ML IJ SOLN: 0.2000 mg | INTRAMUSCULAR | Status: DC | PRN

## 2021-03-17 MED ORDER — METRONIDAZOLE 500 MG/100ML IV SOLN
500.0000 mg | Freq: Once | INTRAVENOUS | Status: AC
  Administered 2021-03-17: 500 mg via INTRAVENOUS
  Filled 2021-03-17: qty 100

## 2021-03-17 MED ORDER — ACETAMINOPHEN 325 MG PO TABS: 650.0000 mg | ORAL_TABLET | Freq: Four times a day (QID) | ORAL | Status: DC | PRN

## 2021-03-17 MED ORDER — NOREPINEPHRINE 4 MG/250ML-% IV SOLN
0.0000 ug/min | INTRAVENOUS | Status: DC
  Administered 2021-03-17: 2 ug/min via INTRAVENOUS
  Filled 2021-03-17 (×2): qty 250

## 2021-03-17 MED ORDER — FENTANYL CITRATE PF 50 MCG/ML IJ SOSY
50.0000 ug | PREFILLED_SYRINGE | Freq: Once | INTRAMUSCULAR | Status: AC
Start: 2021-03-17 — End: 2021-03-17
  Administered 2021-03-17: 50 ug via INTRAVENOUS
  Filled 2021-03-17: qty 1

## 2021-03-17 MED ORDER — ACETAMINOPHEN 650 MG RE SUPP: 650.0000 mg | Freq: Four times a day (QID) | RECTAL | Status: DC | PRN

## 2021-03-17 MED ORDER — DEXTROSE 5 % IV SOLN: INTRAVENOUS | Status: DC

## 2021-03-17 MED ORDER — VANCOMYCIN HCL 500 MG/100ML IV SOLN: 500.0000 mg | INTRAVENOUS | Status: DC

## 2021-03-17 MED ORDER — MORPHINE SULFATE (PF) 2 MG/ML IV SOLN: 2.0000 mg | INTRAVENOUS | Status: DC | PRN

## 2021-03-17 MED ORDER — FENTANYL BOLUS VIA INFUSION
25.0000 ug | INTRAVENOUS | Status: DC | PRN
  Filled 2021-03-17: qty 100

## 2021-03-17 MED ORDER — LACTATED RINGERS IV BOLUS (SEPSIS)
1000.0000 mL | Freq: Once | INTRAVENOUS | Status: AC
  Administered 2021-03-17: 1000 mL via INTRAVENOUS

## 2021-03-17 MED ORDER — LACTATED RINGERS IV SOLN: INTRAVENOUS | Status: DC

## 2021-03-17 MED ORDER — IOHEXOL 350 MG/ML SOLN
80.0000 mL | Freq: Once | INTRAVENOUS | Status: AC | PRN
  Administered 2021-03-17: 80 mL via INTRAVENOUS

## 2021-03-17 MED ORDER — MORPHINE 100MG IN NS 100ML (1MG/ML) PREMIX INFUSION
0.0000 mg/h | INTRAVENOUS | Status: DC
  Filled 2021-03-17: qty 100

## 2021-03-17 MED ORDER — DIPHENHYDRAMINE HCL 50 MG/ML IJ SOLN: 25.0000 mg | INTRAMUSCULAR | Status: DC | PRN

## 2021-03-17 MED ORDER — FENTANYL 2500MCG IN NS 250ML (10MCG/ML) PREMIX INFUSION
25.0000 ug/h | INTRAVENOUS | Status: DC
  Administered 2021-03-18: 25 ug/h via INTRAVENOUS
  Filled 2021-03-17: qty 250

## 2021-03-17 MED ORDER — GLYCOPYRROLATE 1 MG PO TABS
1.0000 mg | ORAL_TABLET | ORAL | Status: DC | PRN
  Filled 2021-03-17: qty 1

## 2021-03-17 NOTE — ED Triage Notes (Signed)
Pt BIB [facility] from home d/t family calling in for resp distress. She was reported to have been nonverbal for 4 days & uses 2L O2 at baseline. EMS reports she was 65% on NRB & was wheezing, Albuterol & Atrovent was given. Hx of COPD & Resp Failure. Upon arrival to ED pt was GCS 11, remains nonverbal.

## 2021-03-17 NOTE — Progress Notes (Signed)
Pharmacy Antibiotic Note  Sydney Lowery is a 87 y.o. female admitted on 03/29/2021 with  sepsis secondary to unknown source .  Pharmacy has been consulted for Aztreonam and vancomycin dosing. Patient has tolerated cephalosporins in the past, so will change aztreonam to Cefepime per protocol. She has already received a dose of Aztreonam in the ED.   SCr 1.3 (BL ~ 0.7), WBC 27.7  Plan: -Cefepime 2 gm IV Q 24 hours -Vancomycin 1250 mg IV load followed by vancomycin 500 mg IV Q 24 hours -Monitor CBC, renal fx, cultures and clinical progress -Vanc levels as indicated   Height: 5\' 6"  (167.6 cm) Weight: 59 kg (130 lb 1.1 oz) IBW/kg (Calculated) : 59.3  Temp (24hrs), Avg:99.1 F (37.3 C), Min:99.1 F (37.3 C), Max:99.1 F (37.3 C)  No results for input(s): WBC, CREATININE, LATICACIDVEN, VANCOTROUGH, VANCOPEAK, VANCORANDOM, GENTTROUGH, GENTPEAK, GENTRANDOM, TOBRATROUGH, TOBRAPEAK, TOBRARND, AMIKACINPEAK, AMIKACINTROU, AMIKACIN in the last 168 hours.  CrCl cannot be calculated (Patient's most recent lab result is older than the maximum 21 days allowed.).    Allergies  Allergen Reactions   Aspirin Other (See Comments)    Has diverticulitis, caused bleeding.   Advil [Ibuprofen] Swelling    "tongue swelling"   Atorvastatin    Indomethacin     REACTION: gi upset   Morphine And Related Swelling    Swelling in mouth   Penicillins    Sulfa Antibiotics Itching    Antimicrobials this admission: Cefepime 9/13 >>  Vancomycin 9/13 >>   Dose adjustments this admission:  Microbiology results: 9/13 BCx:  9/13 UCx:    Thank you for allowing pharmacy to be a part of this patient's care.  10/13, PharmD., BCPS, BCCCP Clinical Pharmacist Please refer to [facility] for unit-specific pharmacist

## 2021-03-17 NOTE — ED Notes (Signed)
CCM at bedside 

## 2021-03-17 NOTE — Consult Note (Signed)
Reason for Consult/Chief Complaint: perforated diverticulitis Consultant: Lynelle Doctor, MD  Sydney Lowery is an 87 y.o. female.   HPI: 87F p/w respiratory distress, lethargy/behavior change over the last few days. EMS called and patient brought to ED where she was found to be in septic shock. She underwent CT scan revealing perforated diverticulitis as the source. Patient is DNR and has been on hospice since 2011 for end stage COPD with [facility]. Per EDP, patient's daughter is requesting maximal medical interventions, including surgery.   Past Medical History:  Diagnosis Date   Allergic rhinitis 03/27/2008   Qualifier: Diagnosis of  By: Jonny Ruiz MD, Len Blalock    ANEMIA-NOS 06/25/2009   Qualifier: Diagnosis of  By: Ewing CMA (AAMA), Robin     Arthritis    Bilateral foot-drop 04/23/2015   Chronic low back pain 04/23/2015   Essential hypertension 02/22/2007   Qualifier: Diagnosis of  By: Briscoe Burns CMA, Alvy Beal     GOUT 09/23/2009   Qualifier: Diagnosis of  By: Jonny Ruiz MD, Len Blalock    Hyperlipidemia 02/22/2007   Qualifier: Diagnosis of  By: Briscoe Burns CMA, Lakisha     Hypertension    OSTEOPENIA 03/27/2008   Qualifier: Diagnosis of  By: Jonny Ruiz MD, Len Blalock    Psychosis (HCC) 01/21/2017    Past Surgical History:  Procedure Laterality Date   arm fracture Left 1967   Fx. in 3 places and had a dropped wrist    Family History  Problem Relation Age of Onset   Heart Problems Mother    Osteoarthritis Mother    Heart Problems Father    Osteoarthritis Father    Heart Problems Brother    Osteoarthritis Brother    Cancer Other     Social History:  reports that she has been smoking cigarettes. She has been smoking an average of .5 packs per day. She uses smokeless tobacco. She reports that she does not drink alcohol and does not use drugs.  Allergies:  Allergies  Allergen Reactions   Aspirin Other (See Comments)    Has diverticulitis, caused bleeding.   Advil [Ibuprofen] Swelling    "tongue swelling"    Atorvastatin    Indomethacin     REACTION: gi upset   Morphine And Related Swelling    Swelling in mouth   Penicillins    Sulfa Antibiotics Itching    Medications: I have reviewed the patient's current medications.  Results for orders placed or performed during the hospital encounter of 2021/04/03 (from the past 48 hour(s))  Lactic acid, plasma     Status: Abnormal   Collection Time: 03-Apr-2021  5:03 PM  Result Value Ref Range   Lactic Acid, Venous 6.6 (HH) 0.5 - 1.9 mmol/L    Comment: CRITICAL RESULT CALLED TO, READ BACK BY AND VERIFIED WITH:  Delphia Grates RN, 1901, 2021/04/03, ADEDOKUNE Performed at [facility], [address] [place], [place] [address]   Comprehensive metabolic panel     Status: Abnormal   Collection Time: 2021-04-03  5:03 PM  Result Value Ref Range   Sodium 142 135 - 145 mmol/L   Potassium 3.9 3.5 - 5.1 mmol/L   Chloride 105 98 - 111 mmol/L   CO2 23 22 - 32 mmol/L   Glucose, Bld 129 (H) 70 - 99 mg/dL    Comment: Glucose reference range applies only to samples taken after fasting for at least 8 hours.   BUN 34 (H) 8 - 23 mg/dL   Creatinine, Ser 5.17 (H) 0.44 -  1.00 mg/dL   Calcium 8.3 (L) 8.9 - 10.3 mg/dL   Total Protein 5.6 (L) 6.5 - 8.1 g/dL   Albumin 1.5 (L) 3.5 - 5.0 g/dL   AST 26 15 - 41 U/L   ALT 14 0 - 44 U/L   Alkaline Phosphatase 85 38 - 126 U/L   Total Bilirubin 1.0 0.3 - 1.2 mg/dL   GFR, Estimated 33 (L) >60 mL/min    Comment: (NOTE) Calculated using the CKD-EPI Creatinine Equation (2021)    Anion gap 14 5 - 15    Comment: Performed at [facility], [address], [place], [place] [address]  Protime-INR     Status: Abnormal   Collection Time: 03/24/2021  5:03 PM  Result Value Ref Range   Prothrombin Time 15.9 (H) 11.4 - 15.2 seconds    Comment: DISREGARD RESULTS. SAMPLE WAS CLOTTED.  NOTIFIED Theone Stanley AT 1900 03/31/2021 BY ZBEECH.   INR 1.3 (H) 0.8 - 1.2    Comment: DISREGARD RESULTS.  SAMPLE WAS CLOTTED.  NOTIFIED Theone Stanley AT 1900 03/10/2021 BY ZBEECH. (NOTE) INR goal varies based on device and disease states. Performed at [facility], [address] [place], [place] [address]   APTT     Status: Abnormal   Collection Time: 03/05/2021  5:03 PM  Result Value Ref Range   aPTT 20 (L) 24 - 36 seconds    Comment: DISREGARD RESULTS.  SAMPLE WAS CLOTTED.  NOTIFIED Theone Stanley AT 1900 04/02/2021 BY ZBEECH. Performed at [facility], [address], [place], [place] [address]   Troponin I (High Sensitivity)     Status: None   Collection Time: 03/10/2021  5:03 PM  Result Value Ref Range   Troponin I (High Sensitivity) 17 <18 ng/L    Comment: (NOTE) Elevated high sensitivity troponin I (hsTnI) values and significant  changes across serial measurements may suggest ACS but many other  chronic and acute conditions are known to elevate hsTnI results.  Refer to the "Links" section for chest pain algorithms and additional  guidance. Performed at [facility], [address] [place], [place] [address]   Ammonia     Status: Abnormal   Collection Time: 03/06/2021  5:10 PM  Result Value Ref Range   Ammonia 105 (H) 9 - 35 umol/L    Comment: Performed at [facility], [address] [place], [place] [address]  Brain natriuretic peptide     Status: None   Collection Time: 03/29/2021  5:10 PM  Result Value Ref Range   B Natriuretic Peptide 63.5 0.0 - 100.0 pg/mL    Comment: Performed at [facility], [address] [place], [place] [address]  I-Stat arterial blood gas, [facility] ED)     Status: Abnormal   Collection Time: 03/26/2021  5:57 PM  Result Value Ref Range   pH, Arterial 7.482 (H) 7.350 - 7.450   pCO2 arterial 30.7 (L) 32.0 - 48.0 mmHg   pO2, Arterial 112 (H) 83.0 - 108.0 mmHg   Bicarbonate 22.9 20.0 - 28.0 mmol/L   TCO2 24 22 - 32 mmol/L   O2 Saturation 99.0 %   Acid-Base Excess 0.0 0.0 - 2.0 mmol/L   Sodium 141 135 - 145 mmol/L   Potassium 4.0 3.5 - 5.1 mmol/L    Calcium, Ion 1.15 1.15 - 1.40 mmol/L   HCT 25.0 (L) 36.0 - 46.0 %   Hemoglobin 8.5 (L) 12.0 - 15.0 g/dL  Patient temperature 99.1 F    Collection site Radial    Drawn by RT    Sample type ARTERIAL   I-stat chem 8, ED (not at [facility] or [facility])     Status: Abnormal   Collection Time: 2021/04/08  6:04 PM  Result Value Ref Range   Sodium 141 135 - 145 mmol/L   Potassium 3.9 3.5 - 5.1 mmol/L   Chloride 107 98 - 111 mmol/L   BUN 31 (H) 8 - 23 mg/dL   Creatinine, Ser 2.20 (H) 0.44 - 1.00 mg/dL   Glucose, Bld 254 (H) 70 - 99 mg/dL    Comment: Glucose reference range applies only to samples taken after fasting for at least 8 hours.   Calcium, Ion 1.10 (L) 1.15 - 1.40 mmol/L   TCO2 23 22 - 32 mmol/L   Hemoglobin 6.5 (LL) 12.0 - 15.0 g/dL   HCT 27.0 (L) 62.3 - 76.2 %   Comment NOTIFIED PHYSICIAN   CBC with Differential/Platelet     Status: Abnormal   Collection Time: 2021/04/08  6:30 PM  Result Value Ref Range   WBC 27.2 (H) 4.0 - 10.5 K/uL   RBC 2.46 (L) 3.87 - 5.11 MIL/uL   Hemoglobin 7.9 (L) 12.0 - 15.0 g/dL   HCT 83.1 (L) 51.7 - 61.6 %   MCV 105.3 (H) 80.0 - 100.0 fL   MCH 32.1 26.0 - 34.0 pg   MCHC 30.5 30.0 - 36.0 g/dL   RDW 07.3 71.0 - 62.6 %   Platelets 460 (H) 150 - 400 K/uL   nRBC 0.0 0.0 - 0.2 %   Neutrophils Relative % 96 %   Neutro Abs 26.1 (H) 1.7 - 7.7 K/uL   Lymphocytes Relative 1 %   Lymphs Abs 0.3 (L) 0.7 - 4.0 K/uL   Monocytes Relative 3 %   Monocytes Absolute 0.8 0.1 - 1.0 K/uL   Eosinophils Relative 0 %   Eosinophils Absolute 0.0 0.0 - 0.5 K/uL   Basophils Relative 0 %   Basophils Absolute 0.0 0.0 - 0.1 K/uL   nRBC 0 0 /100 WBC   Abs Immature Granulocytes 0.00 0.00 - 0.07 K/uL   Polychromasia PRESENT     Comment: Performed at [facility], [address], [place], [place] [address]  Lactic acid, plasma     Status: Abnormal   Collection Time: Apr 08, 2021  8:29 PM  Result Value Ref Range   Lactic Acid, Venous 5.9 (HH) 0.5 - 1.9 mmol/L    Comment: CRITICAL VALUE  NOTED.  VALUE IS CONSISTENT WITH PREVIOUSLY REPORTED AND CALLED VALUE. Performed at [facility], [address] [place], [place] [address]   Troponin I (High Sensitivity)     Status: Abnormal   Collection Time: 2021-04-08  8:29 PM  Result Value Ref Range   Troponin I (High Sensitivity) 27 (H) <18 ng/L    Comment: (NOTE) Elevated high sensitivity troponin I (hsTnI) values and significant  changes across serial measurements may suggest ACS but many other  chronic and acute conditions are known to elevate hsTnI results.  Refer to the "Links" section for chest pain algorithms and additional  guidance. Performed at [facility], [address] [place], [place] [address]   Resp Panel by RT-PCR (Flu A&B, Covid) Nasopharyngeal Swab     Status: None   Collection Time: Apr 08, 2021  8:38 PM   Specimen: Nasopharyngeal Swab; Nasopharyngeal(NP) swabs in vial transport medium  Result Value Ref Range   SARS Coronavirus  2 by RT PCR NEGATIVE NEGATIVE    Comment: (NOTE) SARS-CoV-2 target nucleic acids are NOT DETECTED.  The SARS-CoV-2 RNA is generally detectable in upper respiratory specimens during the acute phase of infection. The lowest concentration of SARS-CoV-2 viral copies this assay can detect is 138 copies/mL. A negative result does not preclude SARS-Cov-2 infection and should not be used as the sole basis for treatment or other patient management decisions. A negative result may occur with  improper specimen collection/handling, submission of specimen other than nasopharyngeal swab, presence of viral mutation(s) within the areas targeted by this assay, and inadequate number of viral copies(<138 copies/mL). A negative result must be combined with clinical observations, patient history, and epidemiological information. The expected result is Negative.  Fact Sheet for Patients:  BloggerCourse.com  Fact Sheet for Healthcare Providers:   SeriousBroker.it  This test is no t yet approved or cleared by the [place] FDA and  has been authorized for detection and/or diagnosis of SARS-CoV-2 by FDA under an Emergency Use Authorization (EUA). This EUA will remain  in effect (meaning this test can be used) for the duration of the COVID-19 declaration under Section 564(b)(1) of the Act, 21 U.S.C.section 360bbb-3(b)(1), unless the authorization is terminated  or revoked sooner.       Influenza A by PCR NEGATIVE NEGATIVE   Influenza B by PCR NEGATIVE NEGATIVE    Comment: (NOTE) The Xpert Xpress SARS-CoV-2/FLU/RSV plus assay is intended as an aid in the diagnosis of influenza from Nasopharyngeal swab specimens and should not be used as a sole basis for treatment. Nasal washings and aspirates are unacceptable for Xpert Xpress SARS-CoV-2/FLU/RSV testing.  Fact Sheet for Patients: BloggerCourse.com  Fact Sheet for Healthcare Providers: SeriousBroker.it  This test is not yet approved or cleared by the [place] FDA and has been authorized for detection and/or diagnosis of SARS-CoV-2 by FDA under an Emergency Use Authorization (EUA). This EUA will remain in effect (meaning this test can be used) for the duration of the COVID-19 declaration under Section 564(b)(1) of the Act, 21 U.S.C. section 360bbb-3(b)(1), unless the authorization is terminated or revoked.  Performed at [facility], [address], [place], [place] [address]     CT HEAD WO CONTRAST ( )  Result Date: April 16, 2021 CLINICAL DATA:  Mental status change, unknown cause EXAM: CT HEAD WITHOUT CONTRAST TECHNIQUE: Contiguous axial images were obtained from the base of the skull through the vertex without intravenous contrast. COMPARISON:  October 07, 2020. FINDINGS: Brain: No evidence of acute infarction, hemorrhage, hydrocephalus, extra-axial collection or mass lesion/mass  effect. Mild for age atrophy. Patchy white matter hypoattenuation, compatible with mild for age chronic microvascular ischemic disease. Vascular: No hyperdense vessel identified. Calcific atherosclerosis. Skull: Bony hyperostosis without acute fracture. Sinuses/Orbits: Air-fluid levels in bilateral sphenoid sinuses. Other: No mastoid effusions. IMPRESSION: 1. No evidence of acute intracranial abnormality. 2. Mild for age atrophy and chronic microvascular ischemic disease. 3. Air-fluid levels in bilateral sphenoid sinuses. Electronically Signed   By: Feliberto Harts M.D.   On: 2021-04-16 19:37   DG Chest Port 1 View  Result Date: 2021-04-16 CLINICAL DATA:  Questionable sepsis. EXAM: PORTABLE CHEST 1 VIEW COMPARISON:  Chest x-ray 10/14/2020. CT angiogram aorta 07/26/2018. FINDINGS: Again seen is a rounded soft tissue bulging at the level of the right heart, unchanged from the prior examination. This corresponds to saccular thoracic aortic aneurysm seen on prior studies. Cardiac silhouette is otherwise within normal limits. There are atherosclerotic calcifications of the aorta. There is  no focal lung consolidation, pleural effusion or pneumothorax. There is linear scarring or atelectasis in the left lung base. No acute fractures are seen. IMPRESSION: 1. No focal lung infiltrate. 2. Grossly unchanged aneurysm of the thoracic aorta. 3.  Aortic Atherosclerosis (ICD10-I70.0). Electronically Signed   By: Darliss Cheney M.D.   On: 03/07/2021 18:22   CT Angio Chest/Abd/Pel for Dissection W and/or Wo Contrast  Result Date: 18-Mar-2021 CLINICAL DATA:  Respiratory distress.  Thoracic aortic aneurysm. EXAM: CT ANGIOGRAPHY CHEST, ABDOMEN AND PELVIS TECHNIQUE: Non-contrast CT of the chest was initially obtained. Multidetector CT imaging through the chest, abdomen and pelvis was performed using the standard protocol during bolus administration of intravenous contrast. Multiplanar reconstructed images and MIPs were obtained  and reviewed to evaluate the vascular anatomy. CONTRAST:  67mL OMNIPAQUE IOHEXOL 350 MG/ML SOLN COMPARISON:  Chest radiograph dated 18-Mar-2021. CTA abdomen/pelvis dated 07/26/2018. FINDINGS: CTA CHEST FINDINGS Cardiovascular: On unenhanced CT, there is no evidence of intramural hematoma. Following contrast administration, there is no evidence of thoracic aortic dissection. Mild progression of the right lateral saccular aneurysm involving the descending thoracic aorta, now measuring 6.1 x 9.0 cm (series 5/image 72), previously 4.0 x 8.5 cm. Again noted is near complete thrombosis of the exophytic sac. Descending thoracic aorta measures 4.6 x 5.3 cm just above the aortic hiatus (series 5/image 84), previously 4.4 x 4.6 cm, with eccentric mural thrombus. Atherosclerotic calcifications of the aortic arch. Although not tailored for evaluation of the pulmonary arteries, there is no evidence of central pulmonary embolism. The heart is normal in size.  No pericardial effusion. Mild coronary atherosclerosis of the LAD and left circumflex. Mediastinum/Nodes: Calcified subcarinal and right perihilar nodes. No suspicious mediastinal lymphadenopathy. Visualized thyroid is unremarkable. Lungs/Pleura: Mild centrilobular and paraseptal emphysematous changes, upper lung predominant. Linear scarring/atelectasis in the lingula. Mild patchy opacities in the bilateral lower lobes, including a 1.9 cm opacity in the medial left lower lobe (series 6/image 95), suspicious for mild infection/pneumonia. Associated 5 mm left lower lobe nodule (series 6/image 68), favored to be related to the suspected infection. Calcified right lower lobe granuloma, benign. No pleural effusion or pneumothorax. Musculoskeletal: Degenerative changes of the lower thoracic spine. Review of the MIP images confirms the above findings. CTA ABDOMEN AND PELVIS FINDINGS VASCULAR Aorta: No evidence of abdominal aortic dissection. 7.7 x 8.8 cm saccular aneurysm of the  infrarenal abdominal aorta, previously 7.0 x 7.5 cm, with near complete thrombosis of the exophytic sac. Atherosclerotic calcifications of the aortic arch. Patent. Celiac: Patent.  Atherosclerotic calcifications. SMA: Patent. Renals: Patent on the left. Narrowing/extrinsic compression of the right renal artery by the saccular aneurysm (series 5/image 98), although this remains patent distally. IMA: Patent. Inflow: Patent with eccentric mural thrombus involving the common iliac arteries bilaterally (series 5/image 136). Atherosclerotic calcifications. Veins: Grossly unremarkable. Review of the MIP images confirms the above findings. NON-VASCULAR Motion degraded images. Hepatobiliary: Liver is grossly unremarkable. Gallbladder is unremarkable. No intrahepatic or extrahepatic ductal dilatation. Pancreas: Within normal limits. Spleen: Within normal limits. Adrenals/Urinary Tract: Adrenal glands are within normal limits. Left kidney is notable for a 12 mm lower pole cyst (series 5/image 115). Severe right renal atrophy with multiple right renal cysts. 8 mm nonobstructing right lower pole renal calculus (series 5/image 112). No hydronephrosis. Bladder is mildly thick-walled although underdistended. Stomach/Bowel: Stomach is within normal limits. No evidence of bowel obstruction. Appendix is not discretely visualized. Extensive sigmoid diverticulosis. Associated diverticulitis with acute macroscopic perforation in the anterior lower pelvis (coronal images 37-38). Associated  localized gas in the right lower pelvis (series 5/images 165-166) with moderate free air beneath the anterior abdominal wall (series 5/images 79, 96, 103, and 151). Lymphatic: No suspicious abdominopelvic lymphadenopathy. Reproductive: Uterus is within normal limits. Bilateral ovaries are within normal limits. Other: Small volume perihepatic and perisplenic ascites. Small volume mesenteric ascites along the sigmoid mesocolon (series 5/image 158).  Musculoskeletal: Degenerative changes of the lumbar spine. Review of the MIP images confirms the above findings. IMPRESSION: Acute sigmoid diverticulitis with macroscopic perforation and moderate free air. No evidence of thoracoabdominal aortic dissection. Progressive 9.0 cm saccular descending thoracic aortic aneurysm and 8.8 cm infrarenal abdominal aortic aneurysm, as above. Additional ancillary findings as above. Critical Value/emergent results were called by telephone at the time of interpretation on 04/03/2021 at 8:00 pm to provider [facility] , who verbally acknowledged these results. Electronically Signed   By: Charline Bills M.D.   On: 03/08/2021 20:04    ROS 10 point review of systems is negative except as listed above in HPI.   Physical Exam Blood pressure 99/75, pulse (!) 38, temperature 99.1 F (37.3 C), temperature source Tympanic, resp. rate (!) 38, height  (1.676 m), weight 59 kg, SpO2 (!) 69 %. Constitutional: well-developed, well-nourished HEENT: pupils equal, round, reactive to light, 2mm b/l, moist conjunctiva, external inspection of ears and nose normal, hearing  unable to be assessed as patient is not interactive and not f/c Oropharynx: normal oropharyngeal mucosa, poor dentition Neck: no thyromegaly, trachea midline, no midline cervical tenderness to palpation Chest: breath sounds equal bilaterally, normal respiratory effort on bipap, no midline or lateral chest wall tenderness to palpation/deformity Abdomen: soft, distended, grimaces with palpation, tympanitic, no bruising, no hepatosplenomegaly GU: normal female genitalia  Rectal: deferred Extremities: 2+ radial and pedal pulses bilaterally, unable to assess motor and sensation bilateral UE and LE, + peripheral edema MSK: unable to assess gait/station, no clubbing/cyanosis of fingers/toes, unable to assess ROM of all four extremities Skin: warm, dry, no rashes Psych: unable to assess memory, mood/affect     Assessment/Plan: 34F with perforated diverticulitis who presented from home where she is total care by her daughter and is bedbound with multiple worsening pressure wounds. She also has  HTN requiring medication and is on home O2 at baseline. She is currently in septic shock on 20 of levophed, on bipap, and is not interactive. Prior to discussing with the family, I contacted [facility] and spoke with Lanora Manis, who provides information that the patient is DNR on hospice since 2021 for end-stage COPD and also has Alzheimers and CKD5. She reports the patient is not on dialysis due to her hospice status. She reports the patient's functional status has declined over the last several days and while the patient had been eating, she now requires being fed by a caretaker. Lanora Manis states the patient's daughter [name] has POA for the patient and is the sole Education officer, environmental. Lanora Manis provided the contact information for the patient's PCP, Dr. Darleene Cleaver, who graciously accepted my phone call this evening and who I put on speakerphone with the patient's daughter and son-in-law in the room and niece via phone in an effort to add a physician with more established rapport with the family. I began the conversation by discussing the identified problem and the surgical option. Dr. Darleene Cleaver participated in the conversation by offering his perspective on the patient's clinical state and tried to encourage the daughter to maintain focus on over-arching goals for the patient. The daughter then stated she felt we were colluding  to "just let her die" and stated "both of you are dismissed", referring to me and Dr. Darleene Cleaver. Dr. Darleene Cleaver inquired what her goals were if she was dismissing the surgeon and she stated she needed a few minutes to think and asked me to come back. When I returned, she was a bit more calm and had discussed with other family members. We discussed the high risk nature of surgery and that this would likely be a terminal  event with or without surgery. Using the ACS risk calculator, she has an above average risk of every complication calculable except surgical site infection, a 95% likelihood of discharge to nursing/rehab facility, 88% likelihood of death, and predicted hospital length of stay of 30 days. In light of this and her current DNR/hospice status, it appears to me that surgery is not in line with her goals of care. After this discussion, daughter states she would like to trial antibiotics for 24-48h and then transition to back to home hospice, which I think is a reasonable plan that is in line with the patient's wishes. Discussed with EDP, will plan for admission by hospitalist service.   Critical care time:   Diamantina Monks, MD General and Trauma Surgery [facility]

## 2021-03-17 NOTE — ED Notes (Signed)
EDP at bedside inserting central line .  

## 2021-03-17 NOTE — Progress Notes (Signed)
Lactate delayed due to difficult IV access. Unable to obtain blood cultures. CVC placed but pt requiring Levophed through line

## 2021-03-17 NOTE — Progress Notes (Signed)
Chaplain encountered patient's daughter in the hall.  She was visibly upset.  She shared she is an only child and that her mom is EOL.  Daughter shared emotion of knowing she has had her mom for a long time, she can't imagine life without her they are best friends.  Daughter was receiving calls and chaplain shared availability if desired. Chaplain Agustin Cree, Mdiv.    04/02/2021 2210  Clinical Encounter Type  Visited With Family  Visit Type ED  Spiritual Encounters  Spiritual Needs Emotional

## 2021-03-17 NOTE — ED Provider Notes (Signed)
[facility] EMERGENCY DEPARTMENT Provider Note   CSN: [id number] Arrival date & time: 03/15/2021  1645     History Chief Complaint  Patient presents with   Respiratory Distress    Sydney Lowery is a 87 y.o. female.  HPI  Patient presents to the ED for evaluation of respiratory difficulty and altered mental status.  According to the EMS report the patient has been nonverbal for the last 4 days.  Today the patient began having more difficulty with her breathing.  EMS noted that she was wheezing and placed on a nonrebreather.  She was also given albuterol and Atrovent.  Patient is not following any commands or answering any questions.  She is unable to provide any history  Past Medical History:  Diagnosis Date   Allergic rhinitis 03/27/2008   Qualifier: Diagnosis of  By: Jonny Ruiz MD, Len Blalock    ANEMIA-NOS 06/25/2009   Qualifier: Diagnosis of  By: Ewing CMA (AAMA), Robin     Arthritis    Bilateral foot-drop 04/23/2015   Chronic low back pain 04/23/2015   Essential hypertension 02/22/2007   Qualifier: Diagnosis of  By: Briscoe Burns CMA, Alvy Beal     GOUT 09/23/2009   Qualifier: Diagnosis of  By: Jonny Ruiz MD, Len Blalock    Hyperlipidemia 02/22/2007   Qualifier: Diagnosis of  By: Briscoe Burns CMA, Lakisha     Hypertension    OSTEOPENIA 03/27/2008   Qualifier: Diagnosis of  By: Jonny Ruiz MD, Len Blalock    Psychosis (HCC) 01/21/2017    Patient Active Problem List   Diagnosis Date Noted   Severe sepsis (HCC) 10/15/2020   Sepsis (HCC) 10/14/2020   Community acquired pneumonia 10/14/2020   Acute respiratory failure with hypoxia (HCC) 10/14/2020   Dysphagia 10/14/2020   Decubitus ulcer of buttock, stage 2 (HCC) 10/14/2020   Pressure injury of skin 10/08/2020   Altered mental status 10/06/2020   DVT of lower limb, acute (HCC) 07/26/2018   Psychosis (HCC) 01/21/2017   Right knee pain 09/15/2016   Left ear hearing loss 09/15/2016   Abdominal pain 08/20/2015   Encounter for well adult exam with  abnormal findings 04/23/2015   Bilateral foot-drop 04/23/2015   Chronic low back pain 04/23/2015   GOUT 09/23/2009   Acute gouty arthropathy 06/25/2009   ANEMIA-NOS 06/25/2009   Pain in joint, ankle and foot 06/25/2009   Allergic rhinitis 03/27/2008   DIVERTICULOSIS, COLON 03/27/2008   OSTEOPENIA 03/27/2008   FREQUENCY, URINARY 03/26/2008   Hyperlipidemia 02/22/2007   Essential hypertension 02/22/2007    Past Surgical History:  Procedure Laterality Date   arm fracture Left 1967   Fx. in 3 places and had a dropped wrist     OB History   No obstetric history on file.     Family History  Problem Relation Age of Onset   Heart Problems Mother    Osteoarthritis Mother    Heart Problems Father    Osteoarthritis Father    Heart Problems Brother    Osteoarthritis Brother    Cancer Other     Social History   Tobacco Use   Smoking status: Every Day    Packs/day: 0.50    Types: Cigarettes   Smokeless tobacco: Current  Substance Use Topics   Alcohol use: No   Drug use: No    Home Medications Prior to Admission medications   Medication Sig Start Date End Date Taking? Authorizing Provider  cefdinir (OMNICEF) 300 MG capsule Take 1 capsule (300 mg total) by mouth 2 (  two) times daily. 10/16/20   Almon Hercules, MD  Cholecalciferol (VITAMIN D-3 PO) Take 1,000 Int'l Units by mouth.    [provider]  Ipratropium-Albuterol (COMBIVENT RESPIMAT) 20-100 MCG/ACT AERS respimat Inhale 1 puff into the lungs every 6 (six) hours. 10/08/20   Marrion Coy, MD  iron polysaccharides (NIFEREX) 150 MG capsule Take 1 capsule (150 mg total) by mouth daily. 10/09/20   Marrion Coy, MD  LORazepam (ATIVAN) 0.5 MG tablet Take 0.5 mg by mouth daily. Daughter states that pt was started on lorazepam (strength unknown) by hospice once daily.    [provider]  oxyCODONE (OXY IR/ROXICODONE) 5 MG immediate release tablet Take 5 mg by mouth daily. Pt's daughter states that she usually gives her  1 tablet daily but will give her an additional tablet during the day if her mother states that she's in pain.    [provider]  QUEtiapine (SEROQUEL) 25 MG tablet Take 1 tablet (25 mg total) by mouth 2 (two) times daily. Patient taking differently: Take 25 mg by mouth 2 (two) times daily. Daughter gives one tablet in AM or PM if no hallucinations. Gives BID if pt is experiencing hallucinations. 01/31/17   Corwin Levins, MD  simvastatin (ZOCOR) 20 MG tablet TAKE 1 TABLET BY MOUTH  DAILY Patient taking differently: 10 mg. Daughter gives half a  tablet daily 09/18/18   Corwin Levins, MD  Specialty Vitamins Products (ECHINACEA C COMPLETE PO) Take 1 tablet by mouth daily.    [provider]  traZODone (DESYREL) 100 MG tablet Take 100 mg by mouth at bedtime. 09/19/20   [provider]  triamterene-hydrochlorothiazide (MAXZIDE-25) 37.5-25 MG tablet Take 0.5 tablets by mouth daily. 10/20/20   Almon Hercules, MD  Turmeric 500 MG TABS Take 500 mg by mouth daily.    [provider]  vitamin B-12 1000 MCG tablet Take 1 tablet (1,000 mcg total) by mouth daily. 10/09/20   Marrion Coy, MD    Allergies    Aspirin, Advil [ibuprofen], Atorvastatin, Indomethacin, Morphine and related, Penicillins, and Sulfa antibiotics  Review of Systems   Review of Systems  Unable to perform ROS: Mental status change  All other systems reviewed and are negative.  Physical Exam Updated Vital Signs BP 108/86   Pulse (!) 102   Temp 99.1 F (37.3 C) (Tympanic)   Resp (!) 39   Ht 1.676 m ( )   Wt 59 kg   SpO2 (!) 69%   BMI 20.99 kg/m   Physical Exam Vitals and nursing note reviewed.  Constitutional:      General: She is in acute distress.     Appearance: She is well-developed. She is ill-appearing and toxic-appearing.  HENT:     Head: Normocephalic and atraumatic.     Right Ear: External ear normal.     Left Ear: External ear normal.  Eyes:     General: No scleral icterus.        Right eye: No discharge.        Left eye: No discharge.     Conjunctiva/sclera: Conjunctivae normal.  Neck:     Trachea: No tracheal deviation.  Cardiovascular:     Rate and Rhythm: Normal rate and regular rhythm.     Comments: Strong femoral pulses bilaterally Pulmonary:     Effort: No respiratory distress.     Breath sounds: No stridor. No wheezing or rales.  Abdominal:     General: Bowel sounds are normal. There is  no distension.     Palpations: Abdomen is soft.     Tenderness: There is no abdominal tenderness. There is no guarding or rebound.  Musculoskeletal:        General: No tenderness or deformity.     Cervical back: Neck supple.     Comments: Deep decubitus ulcer sacrum  Skin:    General: Skin is warm and dry.     Findings: No rash.  Neurological:     GCS: GCS eye subscore is 1. GCS verbal subscore is 1. GCS motor subscore is 1.     Cranial Nerves: No cranial nerve deficit (no facial droop, extraocular movements intact, no slurred speech).     Sensory: No sensory deficit.     Motor: No abnormal muscle tone or seizure activity.     Coordination: Coordination normal.  Psychiatric:        Mood and Affect: Mood normal.    ED Results / Procedures / Treatments   Labs (all labs ordered are listed, but only abnormal results are displayed) Labs Reviewed  LACTIC ACID, PLASMA - Abnormal; Notable for the following components:      Result Value   Lactic Acid, Venous 6.6 (*)    All other components within normal limits  LACTIC ACID, PLASMA - Abnormal; Notable for the following components:   Lactic Acid, Venous 5.9 (*)    All other components within normal limits  COMPREHENSIVE METABOLIC PANEL - Abnormal; Notable for the following components:   Glucose, Bld 129 (*)    BUN 34 (*)    Creatinine, Ser 1.46 (*)    Calcium 8.3 (*)    Total Protein 5.6 (*)    Albumin 1.5 (*)    GFR, Estimated 33 (*)    All other components within normal limits  PROTIME-INR - Abnormal;  Notable for the following components:   Prothrombin Time 15.9 (*)    INR 1.3 (*)    All other components within normal limits  APTT - Abnormal; Notable for the following components:   aPTT 20 (*)    All other components within normal limits  AMMONIA - Abnormal; Notable for the following components:   Ammonia 105 (*)    All other components within normal limits  CBC WITH DIFFERENTIAL/PLATELET - Abnormal; Notable for the following components:   WBC 27.2 (*)    RBC 2.46 (*)    Hemoglobin 7.9 (*)    HCT 25.9 (*)    MCV 105.3 (*)    Platelets 460 (*)    Neutro Abs 26.1 (*)    Lymphs Abs 0.3 (*)    All other components within normal limits  I-STAT ARTERIAL BLOOD GAS, ED - Abnormal; Notable for the following components:   pH, Arterial 7.482 (*)    pCO2 arterial 30.7 (*)    pO2, Arterial 112 (*)    HCT 25.0 (*)    Hemoglobin 8.5 (*)    All other components within normal limits  I-STAT CHEM 8, ED - Abnormal; Notable for the following components:   BUN 31 (*)    Creatinine, Ser 1.30 (*)    Glucose, Bld 127 (*)    Calcium, Ion 1.10 (*)    Hemoglobin 6.5 (*)    HCT 19.0 (*)    All other components within normal limits  TROPONIN I (HIGH SENSITIVITY) - Abnormal; Notable for the following components:   Troponin I (High Sensitivity) 27 (*)    All other components within normal limits  RESP PANEL BY  RT-PCR (FLU A&B, COVID) ARPGX2  CULTURE, BLOOD (ROUTINE X 2)  URINE CULTURE  BRAIN NATRIURETIC PEPTIDE  CBC WITH DIFFERENTIAL/PLATELET  URINALYSIS, ROUTINE W REFLEX MICROSCOPIC  MISCELLANEOUS GENETIC TEST  TROPONIN I (HIGH SENSITIVITY)    EKG None  Radiology CT HEAD WO CONTRAST ( )  Result Date: 03/20/2021 CLINICAL DATA:  Mental status change, unknown cause EXAM: CT HEAD WITHOUT CONTRAST TECHNIQUE: Contiguous axial images were obtained from the base of the skull through the vertex without intravenous contrast. COMPARISON:  October 07, 2020. FINDINGS: Brain: No evidence of acute  infarction, hemorrhage, hydrocephalus, extra-axial collection or mass lesion/mass effect. Mild for age atrophy. Patchy white matter hypoattenuation, compatible with mild for age chronic microvascular ischemic disease. Vascular: No hyperdense vessel identified. Calcific atherosclerosis. Skull: Bony hyperostosis without acute fracture. Sinuses/Orbits: Air-fluid levels in bilateral sphenoid sinuses. Other: No mastoid effusions. IMPRESSION: 1. No evidence of acute intracranial abnormality. 2. Mild for age atrophy and chronic microvascular ischemic disease. 3. Air-fluid levels in bilateral sphenoid sinuses. Electronically Signed   By: Feliberto Harts M.D.   On: 03/31/2021 19:37   DG Chest Port 1 View  Result Date: 04/03/2021 CLINICAL DATA:  Questionable sepsis. EXAM: PORTABLE CHEST 1 VIEW COMPARISON:  Chest x-ray 10/14/2020. CT angiogram aorta 07/26/2018. FINDINGS: Again seen is a rounded soft tissue bulging at the level of the right heart, unchanged from the prior examination. This corresponds to saccular thoracic aortic aneurysm seen on prior studies. Cardiac silhouette is otherwise within normal limits. There are atherosclerotic calcifications of the aorta. There is no focal lung consolidation, pleural effusion or pneumothorax. There is linear scarring or atelectasis in the left lung base. No acute fractures are seen. IMPRESSION: 1. No focal lung infiltrate. 2. Grossly unchanged aneurysm of the thoracic aorta. 3.  Aortic Atherosclerosis (ICD10-I70.0). Electronically Signed   By: Darliss Cheney M.D.   On: 03/14/2021 18:22   CT Angio Chest/Abd/Pel for Dissection W and/or Wo Contrast  Result Date: 03/16/2021 CLINICAL DATA:  Respiratory distress.  Thoracic aortic aneurysm. EXAM: CT ANGIOGRAPHY CHEST, ABDOMEN AND PELVIS TECHNIQUE: Non-contrast CT of the chest was initially obtained. Multidetector CT imaging through the chest, abdomen and pelvis was performed using the standard protocol during bolus administration  of intravenous contrast. Multiplanar reconstructed images and MIPs were obtained and reviewed to evaluate the vascular anatomy. CONTRAST:  80mL OMNIPAQUE IOHEXOL 350 MG/ML SOLN COMPARISON:  Chest radiograph dated 03/17/2021. CTA abdomen/pelvis dated 07/26/2018. FINDINGS: CTA CHEST FINDINGS Cardiovascular: On unenhanced CT, there is no evidence of intramural hematoma. Following contrast administration, there is no evidence of thoracic aortic dissection. Mild progression of the right lateral saccular aneurysm involving the descending thoracic aorta, now measuring 6.1 x 9.0 cm (series 5/image 72), previously 4.0 x 8.5 cm. Again noted is near complete thrombosis of the exophytic sac. Descending thoracic aorta measures 4.6 x 5.3 cm just above the aortic hiatus (series 5/image 84), previously 4.4 x 4.6 cm, with eccentric mural thrombus. Atherosclerotic calcifications of the aortic arch. Although not tailored for evaluation of the pulmonary arteries, there is no evidence of central pulmonary embolism. The heart is normal in size.  No pericardial effusion. Mild coronary atherosclerosis of the LAD and left circumflex. Mediastinum/Nodes: Calcified subcarinal and right perihilar nodes. No suspicious mediastinal lymphadenopathy. Visualized thyroid is unremarkable. Lungs/Pleura: Mild centrilobular and paraseptal emphysematous changes, upper lung predominant. Linear scarring/atelectasis in the lingula. Mild patchy opacities in the bilateral lower lobes, including a 1.9 cm opacity in the medial left lower lobe (series 6/image 95), suspicious for mild infection/pneumonia.  Associated 5 mm left lower lobe nodule (series 6/image 68), favored to be related to the suspected infection. Calcified right lower lobe granuloma, benign. No pleural effusion or pneumothorax. Musculoskeletal: Degenerative changes of the lower thoracic spine. Review of the MIP images confirms the above findings. CTA ABDOMEN AND PELVIS FINDINGS VASCULAR Aorta: No  evidence of abdominal aortic dissection. 7.7 x 8.8 cm saccular aneurysm of the infrarenal abdominal aorta, previously 7.0 x 7.5 cm, with near complete thrombosis of the exophytic sac. Atherosclerotic calcifications of the aortic arch. Patent. Celiac: Patent.  Atherosclerotic calcifications. SMA: Patent. Renals: Patent on the left. Narrowing/extrinsic compression of the right renal artery by the saccular aneurysm (series 5/image 98), although this remains patent distally. IMA: Patent. Inflow: Patent with eccentric mural thrombus involving the common iliac arteries bilaterally (series 5/image 136). Atherosclerotic calcifications. Veins: Grossly unremarkable. Review of the MIP images confirms the above findings. NON-VASCULAR Motion degraded images. Hepatobiliary: Liver is grossly unremarkable. Gallbladder is unremarkable. No intrahepatic or extrahepatic ductal dilatation. Pancreas: Within normal limits. Spleen: Within normal limits. Adrenals/Urinary Tract: Adrenal glands are within normal limits. Left kidney is notable for a 12 mm lower pole cyst (series 5/image 115). Severe right renal atrophy with multiple right renal cysts. 8 mm nonobstructing right lower pole renal calculus (series 5/image 112). No hydronephrosis. Bladder is mildly thick-walled although underdistended. Stomach/Bowel: Stomach is within normal limits. No evidence of bowel obstruction. Appendix is not discretely visualized. Extensive sigmoid diverticulosis. Associated diverticulitis with acute macroscopic perforation in the anterior lower pelvis (coronal images 37-38). Associated localized gas in the right lower pelvis (series 5/images 165-166) with moderate free air beneath the anterior abdominal wall (series 5/images 79, 96, 103, and 151). Lymphatic: No suspicious abdominopelvic lymphadenopathy. Reproductive: Uterus is within normal limits. Bilateral ovaries are within normal limits. Other: Small volume perihepatic and perisplenic ascites. Small  volume mesenteric ascites along the sigmoid mesocolon (series 5/image 158). Musculoskeletal: Degenerative changes of the lumbar spine. Review of the MIP images confirms the above findings. IMPRESSION: Acute sigmoid diverticulitis with macroscopic perforation and moderate free air. No evidence of thoracoabdominal aortic dissection. Progressive 9.0 cm saccular descending thoracic aortic aneurysm and 8.8 cm infrarenal abdominal aortic aneurysm, as above. Additional ancillary findings as above. Critical Value/emergent results were called by telephone at the time of interpretation on 03/16/2021 at 8:00 pm to provider [facility] , who verbally acknowledged these results. Electronically Signed   By: Charline Bills M.D.   On: 03/07/2021 20:04    Procedures .Critical Care Performed by: Linwood Dibbles, MD Authorized by: Linwood Dibbles, MD   Critical care provider statement:    Critical care time (minutes):  45   Critical care was time spent personally by me on the following activities:  Discussions with consultants, evaluation of patient's response to treatment, examination of patient, ordering and performing treatments and interventions, ordering and review of laboratory studies, ordering and review of radiographic studies, pulse oximetry, re-evaluation of patient's condition, obtaining history from patient or surrogate and review of old charts   Medications Ordered in ED Medications  norepinephrine (LEVOPHED) 4mg  in premix infusion (30 mcg/min Intravenous Rate/Dose Change 03-19-2021 0005)  acetaminophen (TYLENOL) tablet 650 mg (has no administration in time range)    Or  acetaminophen (TYLENOL) suppository 650 mg (has no administration in time range)  diphenhydrAMINE (BENADRYL) injection 25 mg (has no administration in time range)  glycopyrrolate (ROBINUL) tablet 1 mg (has no administration in time range)    Or  glycopyrrolate (ROBINUL) injection 0.2 mg (has no  administration in time range)    Or   glycopyrrolate (ROBINUL) injection 0.2 mg (has no administration in time range)  polyvinyl alcohol (LIQUIFILM TEARS) 1.4 % ophthalmic solution 1 drop (has no administration in time range)  midazolam (VERSED) injection 2-4 mg (has no administration in time range)  fentaNYL in NS (46mcg/ml) infusion-PREMIX (25 mcg/hr Intravenous New Bag/Given 03/19/2021 0002)  fentaNYL (SUBLIMAZE) bolus via infusion 25-100 mcg (has no administration in time range)  lactated ringers bolus 1,000 mL (0 mLs Intravenous Stopped 2021/04/04 2218)    And  lactated ringers bolus 1,000 mL (0 mLs Intravenous Stopped 04-Apr-2021 2218)  metroNIDAZOLE (FLAGYL) IVPB 500 mg (0 mg Intravenous Stopped 04-Apr-2021 1855)  vancomycin (VANCOREADY) IVPB 1250 mg/250 mL (0 mg Intravenous Stopped 04/04/2021 2012)  iohexol (OMNIPAQUE) 350 MG/ML injection 80 mL (80 mLs Intravenous Contrast Given 04-04-21 1929)  fentaNYL (SUBLIMAZE) injection 50 mcg (50 mcg Intravenous Given 04/04/21 2236)  fentaNYL (SUBLIMAZE) injection 25 mcg (25 mcg Intravenous Given 03/23/2021 0003)    ED Course  I have reviewed the triage vital signs and the nursing notes.  Pertinent labs & imaging results that were available during my care of the patient were reviewed by me and considered in my medical decision making (see chart for details).  Clinical Course as of 03/06/2021 0015  Tue 04-Apr-2021  1710 Notes indicate patient is an active hospice patient.  Prior DNR noted.  We will attempt to contact family to discuss goals of care [JK]  1715 Discussed with family earlier.  Would like all measure at this time. [JK]  1748 Central line placed.  We will continue with IV fluid resuscitation and have ordered Levophed.  Will check ABG determine further respiratory intervention [JK]  1813 ABg shows normal ph. No resp acidosis.  Will continue with bipap  [JK]  1920 Leukocytosis consistent with sepsis [JK]  1920 Hemoglobin is 7.9 decreased from 5 months ago [JK]  1958 Lactic  acid level elevated at 6.6 [JK]  1959 Head CT notable for air-fluid levels in the sphenoid sinus but no acute findings in the brain [JK]  1959 Pulse ox is not correlating with ABG [JK]  2004 Ct scan sows diverticulitis with perf [JK]  2019 D/w critical care.  Recc discuss with gen surg first to see if she is a surgical candidate.  If not would be admission to medical service D/w Dr Bedelia Person.  WIll evaluate pt in the ED> [JK]  2154 Discussed case with Dr. Bedelia Person.  She had a conversation with the patient's family.  Discussed surgical options.  Family has opted right now for antibiotics.  Grave nature of the patient's illness has been discussed with the family.  I will consult the medical service for admission [JK]    Clinical Course User Index [JK] Linwood Dibbles, MD   MDM Rules/Calculators/A&P                           Patient presents to the ED with altered mental status.  Presentation also concerning for sepsis with her being hypotensive here in the ER.  Patient's had decreased oxygen saturation but this did not correlate with her ABG.  We do not appear to be getting a good wave tracing with her decreased perfusion.  Patient was started on IV fluids, empiric antibiotics and pressors.  I did place a central line.  Patient's evaluation was notable for a perforated diverticulitis with moderate free air.  Case was  discussed with general surgery and critical care.  Several physicians including myself had discussions with the patient's family regarding the severe nature of her illness and infection would not be likely to survive.  Family is very reticent to not proceed with full care including pressures on IV fluids and antibiotics.  Daughter does understand that patient's illness is critical.  Daughter is not quite ready to withdraw all care but she is hoping for her to possibly go to hospice tomorrow.  I do not think this is likely but we will continue with supportive care at this time. Final Clinical  Impression(s) / ED Diagnoses Final diagnoses:  Diverticulitis  Sepsis with acute organ dysfunction and septic shock, due to unspecified organism, unspecified type [facility])     Linwood Dibbles, MD 03/11/2021 0015

## 2021-03-17 NOTE — Progress Notes (Signed)
Elink following Code Sepsis. 

## 2021-03-17 NOTE — H&P (Signed)
NAME:  Sydney Lowery, MRN:  [id number], DOB:  [birth year], LOS: 0 ADMISSION DATE:  03/29/2021, CONSULTATION DATE: 04/01/2021 REFERRING MD: Dr. Tomi Bamberger perforated bowel, CHIEF COMPLAINT: Perforated bowel  History of Present Illness:  This is a 87 year old female, chronic back pain, hypertension, hyperlipidemia.  Was originally at home on hospice care.  Patient became less responsive today complaining of belly pain.  Brought to the emergency department with concern for sepsis.  CT scan of the abdomen revealed perforated diverticulum.  Patient had been seen by general surgery discussed her significant comorbidities of age and likely not to survive a major operation.  CT imaging revealed perforated bowel with free intraperitoneal air.  She had severe acidosis, multiorgan failure and shock.  Patient is now maxed on norepinephrine.  I met with the patient's daughter at bedside.  She is very distraught about everything that has happened today.  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.  She worked here for 37 years and retired 20+ years ago.  We discussed neck steps and the understanding that she has a terminal illness at this point and is made the decision not to pursue surgery.  We are expecting now a in-hospital death with transition to comfort care measures.  Patient is now a DNR  Pertinent  Medical History   Past Medical History:  Diagnosis Date   Allergic rhinitis 03/27/2008   Qualifier: Diagnosis of  By: Jenny Reichmann MD, Hunt Oris    ANEMIA-NOS 06/25/2009   Qualifier: Diagnosis of  By: Ewing CMA (AAMA), Robin     Arthritis    Bilateral foot-drop 04/23/2015   Chronic low back pain 04/23/2015   Essential hypertension 02/22/2007   Qualifier: Diagnosis of  By: Tiney Rouge CMA, Ellison Hughs     GOUT 09/23/2009   Qualifier: Diagnosis of  By: Jenny Reichmann MD, Hunt Oris    Hyperlipidemia 02/22/2007   Qualifier: Diagnosis of  By: Tiney Rouge CMA, Lakisha     Hypertension    OSTEOPENIA 03/27/2008   Qualifier: Diagnosis of  By:  Jenny Reichmann MD, Hunt Oris    Psychosis ([place]) 01/21/2017     Significant Hospital Events: Including procedures, antibiotic start and stop dates in addition to other pertinent events     Interim History / Subjective:  Per HPI above  Objective   Blood pressure 108/86, pulse (!) 102, temperature 99.1 F (37.3 C), temperature source Tympanic, resp. rate (!) 39, height $RemoveBe'5\' 6"'MOFQKoNfE$  (1.676 m), weight 59 kg, SpO2 (!) 69 %.    Vent Mode: BIPAP FiO2 (%):  [40 %-100 %] 100 % Set Rate:  [8 bmp-15 bmp] 8 bmp PEEP:  [6 cmH20] 6 cmH20   Intake/Output Summary (Last 24 hours) at 04/03/2021 2318 Last data filed at 03/20/2021 2218 Gross per 24 hour  Intake 2550 ml  Output --  Net 2550 ml   Filed Weights   03/10/2021 1600  Weight: 59 kg    Examination: General: Female, on BiPAP, unresponsive HENT: NCAT, not tracking no blink to attack Lungs: Shallow breaths Cardiovascular: Tachycardic, regular Abdomen: Distended Extremities: No significant edema Neuro: Not following commands GU: Furred  Resolved Hospital Problem list     Assessment & Plan:   Elderly 87 year old female, perforated bowel, multiorgan failure, leukocytosis, septic shock, intraperitoneal air, peritonitis, anemia, acute hypoxemic respiratory failure requiring BiPAP support, septic shock requiring norepinephrine vasopressor support.  DNR, now on comfort care measures with plans to transition.  Plan: We discussed all of the patient's current medical state and unlikely chance at survival even  with surgery. The daughter has already met with surgery and discussed this. She is on high-dose vasopressors but at this time would like to consider transition to comfort care measures. There are other family that would like to visit. We would like to try to honor these wishes of the family. We will start by adding morphine infusion and as needed Versed. We will try to keep her as comfortable as possible. When she appears more comfortable we will remove  her from BiPAP support. At that time we will slowly wean down her vasopressors. I did explain that I did not expect her to survive the night. Family are on the way to the hospital  DNR   Labs   CBC: Recent Labs  Lab 03/10/2021 1757 03/30/2021 1804 03/11/2021 1830  WBC  --   --  27.2*  NEUTROABS  --   --  26.1*  HGB 8.5* 6.5* 7.9*  HCT 25.0* 19.0* 25.9*  MCV  --   --  105.3*  PLT  --   --  460*    Basic Metabolic Panel: Recent Labs  Lab 03/11/2021 1703 03/28/2021 1757 03/10/2021 1804  NA 142 141 141  K 3.9 4.0 3.9  CL 105  --  107  CO2 23  --   --   GLUCOSE 129*  --  127*  BUN 34*  --  31*  CREATININE 1.46*  --  1.30*  CALCIUM 8.3*  --   --    GFR: Estimated Creatinine Clearance: 24.6 mL/min (A) (by C-G formula based on SCr of 1.3 mg/dL (H)). Recent Labs  Lab 03/29/2021 1703 03/09/2021 1830 03/19/2021 2029  WBC  --  27.2*  --   LATICACIDVEN 6.6*  --  5.9*    Liver Function Tests: Recent Labs  Lab 03/26/2021 1703  AST 26  ALT 14  ALKPHOS 85  BILITOT 1.0  PROT 5.6*  ALBUMIN 1.5*   No results for input(s): LIPASE, AMYLASE in the last 168 hours. Recent Labs  Lab 03/15/2021 1710  AMMONIA 105*    ABG    Component Value Date/Time   PHART 7.482 (H) 03/28/2021 1757   PCO2ART 30.7 (L) 03/27/2021 1757   PO2ART 112 (H) 03/09/2021 1757   HCO3 22.9 03/26/2021 1757   TCO2 23 03/14/2021 1804   O2SAT 99.0 03/22/2021 1757     Coagulation Profile: Recent Labs  Lab 03/10/2021 1703  INR 1.3*    Cardiac Enzymes: No results for input(s): CKTOTAL, CKMB, CKMBINDEX, TROPONINI in the last 168 hours.  HbA1C: Hgb A1c MFr Bld  Date/Time Value Ref Range Status  05/20/2009 05:30 AM  4.6 - 6.1 % Final   6.0 (NOTE) The ADA recommends the following therapeutic goal for glycemic control related to Hgb A1c measurement: Goal of therapy: <6.5 Hgb A1c  Reference: American Diabetes Association: Clinical Practice Recommendations 2010, Diabetes Care, 2010, 33: (Suppl  1).    CBG: No  results for input(s): GLUCAP in the last 168 hours.  Review of Systems:   Unable to be obtained  Past Medical History:  She,  has a past medical history of Allergic rhinitis (03/27/2008), ANEMIA-NOS (06/25/2009), Arthritis, Bilateral foot-drop (04/23/2015), Chronic low back pain (04/23/2015), Essential hypertension (02/22/2007), GOUT (09/23/2009), Hyperlipidemia (02/22/2007), Hypertension, OSTEOPENIA (03/27/2008), and Psychosis ([place]) (01/21/2017).   Surgical History:   Past Surgical History:  Procedure Laterality Date   arm fracture Left 1967   Fx. in 3 places and had a dropped wrist     Social History:   reports that  she has been smoking cigarettes. She has been smoking an average of .5 packs per day. She uses smokeless tobacco. She reports that she does not drink alcohol and does not use drugs.   Family History:  Her family history includes Cancer in an other family member; Heart Problems in her brother, father, and mother; Osteoarthritis in her brother, father, and mother.   Allergies Allergies  Allergen Reactions   Aspirin Other (See Comments)    Has diverticulitis, caused bleeding.   Advil [Ibuprofen] Swelling    "tongue swelling"   Atorvastatin    Indomethacin     REACTION: gi upset   Morphine And Related Swelling    Swelling in mouth   Penicillins    Sulfa Antibiotics Itching     Home Medications  Prior to Admission medications   Medication Sig Start Date End Date Taking? Authorizing Provider  cefdinir (OMNICEF) 300 MG capsule Take 1 capsule (300 mg total) by mouth 2 (two) times daily. 10/16/20   Mercy Riding, MD  Cholecalciferol (VITAMIN D-3 PO) Take 1,000 Int'l Units by mouth.    [provider]  Ipratropium-Albuterol (COMBIVENT RESPIMAT) 20-100 MCG/ACT AERS respimat Inhale 1 puff into the lungs every 6 (six) hours. 10/08/20   Sharen Hones, MD  iron polysaccharides (NIFEREX) 150 MG capsule Take 1 capsule (150 mg total) by mouth daily. 10/09/20   Sharen Hones, MD   LORazepam (ATIVAN) 0.5 MG tablet Take 0.5 mg by mouth daily. Daughter states that pt was started on lorazepam (strength unknown) by hospice once daily.    [provider]  oxyCODONE (OXY IR/ROXICODONE) 5 MG immediate release tablet Take 5 mg by mouth daily. Pt's daughter states that she usually gives her 1 tablet daily but will give her an additional tablet during the day if her mother states that she's in pain.    [provider]  QUEtiapine (SEROQUEL) 25 MG tablet Take 1 tablet (25 mg total) by mouth 2 (two) times daily. Patient taking differently: Take 25 mg by mouth 2 (two) times daily. Daughter gives one tablet in AM or PM if no hallucinations. Gives BID if pt is experiencing hallucinations. 01/31/17   Biagio Borg, MD  simvastatin (ZOCOR) 20 MG tablet TAKE 1 TABLET BY MOUTH  DAILY Patient taking differently: 10 mg. Daughter gives half a $Remo'20mg'QCwXy$  tablet daily 09/18/18   Biagio Borg, MD  Specialty Vitamins Products (ECHINACEA C COMPLETE PO) Take 1 tablet by mouth daily.    [provider]  traZODone (DESYREL) 100 MG tablet Take 100 mg by mouth at bedtime. 09/19/20   [provider]  triamterene-hydrochlorothiazide (MAXZIDE-25) 37.5-25 MG tablet Take 0.5 tablets by mouth daily. 10/20/20   Mercy Riding, MD  Turmeric 500 MG TABS Take 500 mg by mouth daily.    [provider]  vitamin B-12 1000 MCG tablet Take 1 tablet (1,000 mcg total) by mouth daily. 10/09/20   Sharen Hones, MD     This patient is critically ill with multiple organ system failure; which, requires frequent high complexity decision making, assessment, support, evaluation, and titration of therapies. This was completed through the application of advanced monitoring technologies and extensive interpretation of multiple databases. During this encounter critical care time was devoted to patient care services described in this note for 33 minutes.  [facility] 03/05/2021 11:24 PM

## 2021-03-22 LAB — CULTURE, BLOOD (ROUTINE X 2)
Culture: NO GROWTH
Special Requests: ADEQUATE

## 2021-04-04 NOTE — Progress Notes (Signed)
PCCM:  Called back to ED by daughter at bedside.  She is tearful and sad about everything going on.   Upon arrival to room patients SBP 40s   I explained that I did not think she would live much longer RT was called to room along with nursing  Patient appeared comfortable on fentanyl infusion.  Daughter wanted to touch patients face.   BIPAP was removed from patient  She was left comfortable on fentanyl infusion  Daughter at bedside.   I suspect patient will pass shortly  Josephine Igo, DO [facility] 03/26/2021 12:49 AM

## 2021-04-04 NOTE — ED Notes (Signed)
Time of death at 216 pronounced by two RN's this Clinical research associate and [employer]

## 2021-04-04 NOTE — ED Notes (Signed)
Pt taken off bipap. ?

## 2021-04-04 DEATH — deceased

## 2022-01-12 IMAGING — DX DG CHEST 1V PORT
1 series · 1 of 1 positions shown · non-contrast
Comparison: 10/06/2020, runoff 07/26/2018

CLINICAL DATA: Fever

EXAM:
PORTABLE CHEST 1 VIEW

[chest ap]
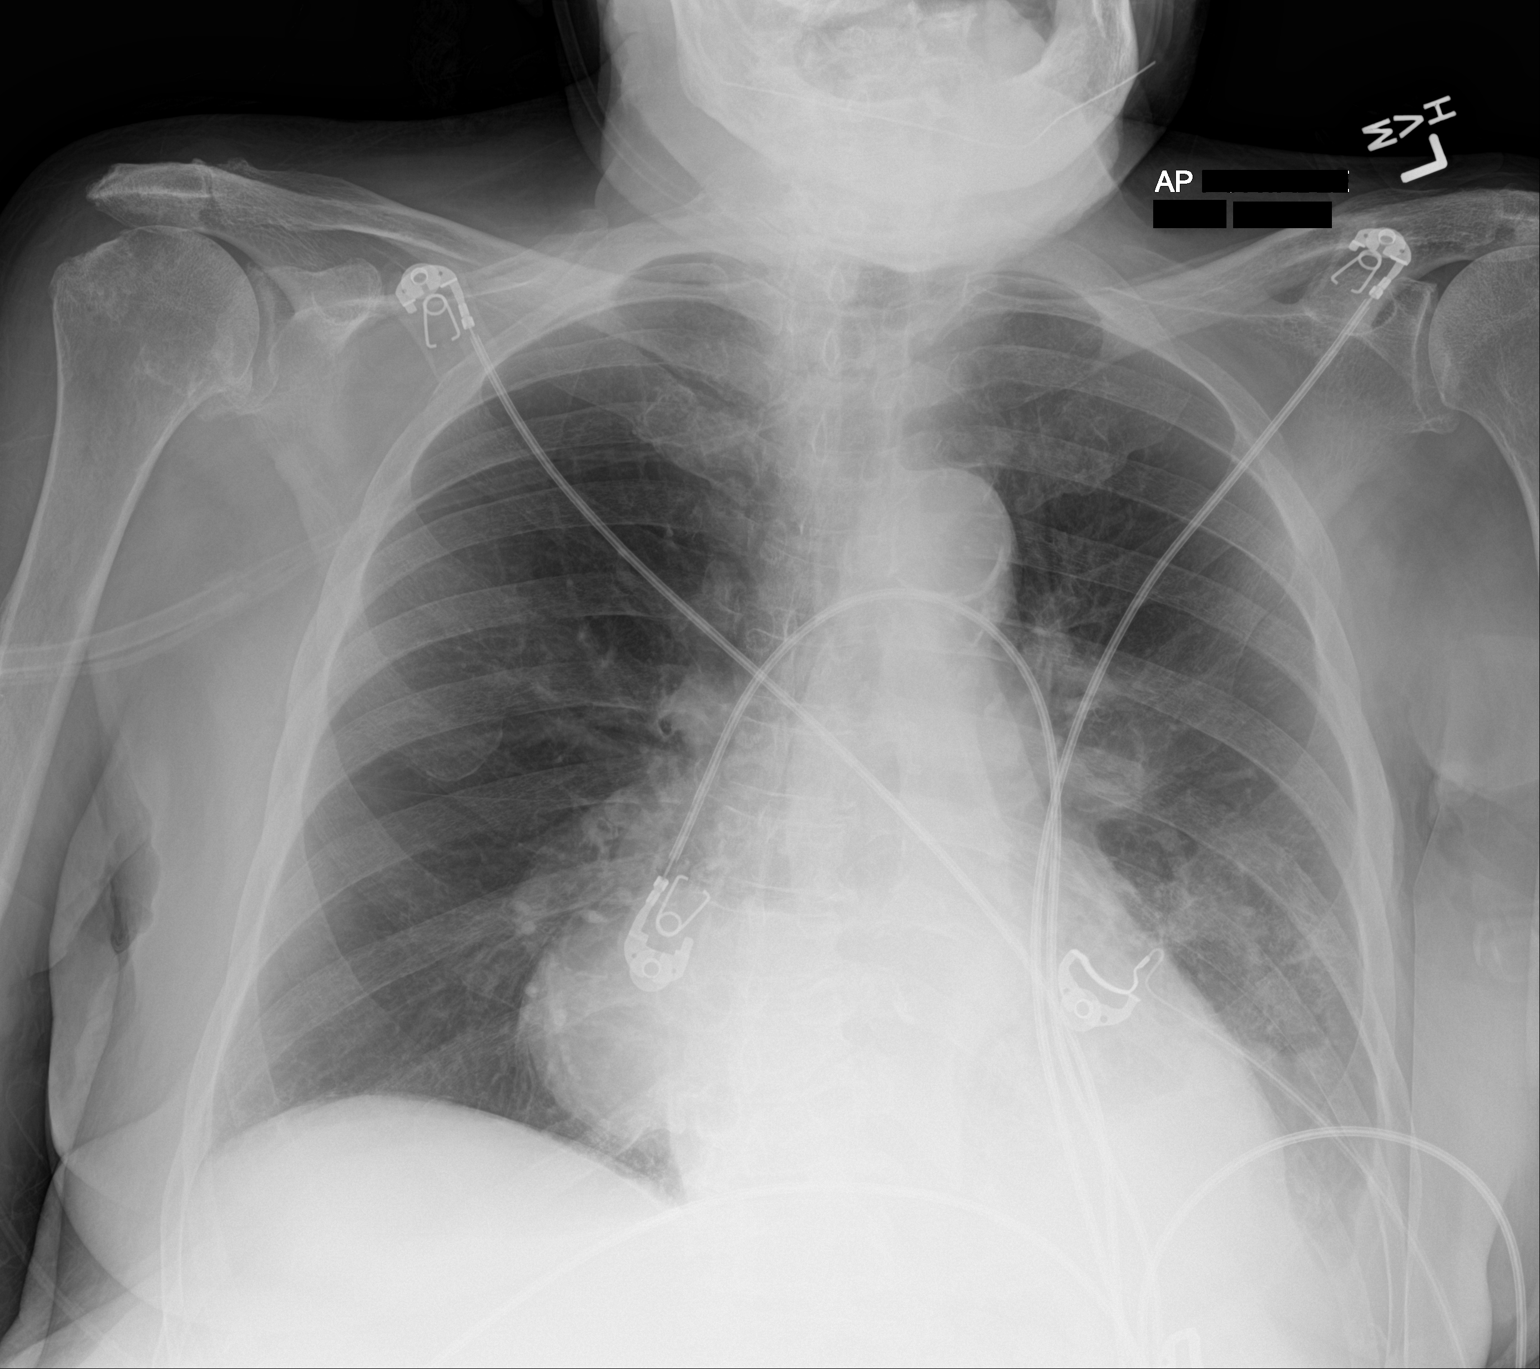

[1 of 1 positions shown; findings below may reference images not displayed]

FINDINGS: Right lung is grossly clear. New small left pleural effusion and
airspace disease at the left lung base. Stable cardiomediastinal
silhouette with convex bulging at the right lower mediastinal
silhouette corresponding to saccular aneurysm noted on prior CT.
IMPRESSION: Development of small left pleural effusion with left lung base
atelectasis or pneumonia. Contour bulging at the right cardiac
margin, felt to correspond to large saccular aneurysm noted on the
patient's prior CT angiogram.
